# Patient Record
Sex: Female | Born: 1975 | Race: White | Hispanic: No | Marital: Married | Smoking: Former smoker
Health system: Southern US, Community
[De-identification: ages and names within clinical notes are randomized; demographics above are authoritative.]

## PROBLEM LIST (undated history)

## (undated) DIAGNOSIS — K219 Gastro-esophageal reflux disease without esophagitis: Secondary | ICD-10-CM

## (undated) DIAGNOSIS — G47419 Narcolepsy without cataplexy: Secondary | ICD-10-CM

## (undated) DIAGNOSIS — E039 Hypothyroidism, unspecified: Secondary | ICD-10-CM

## (undated) DIAGNOSIS — F329 Major depressive disorder, single episode, unspecified: Secondary | ICD-10-CM

## (undated) DIAGNOSIS — F419 Anxiety disorder, unspecified: Secondary | ICD-10-CM

## (undated) DIAGNOSIS — T4145XA Adverse effect of unspecified anesthetic, initial encounter: Secondary | ICD-10-CM

## (undated) DIAGNOSIS — M5126 Other intervertebral disc displacement, lumbar region: Secondary | ICD-10-CM

## (undated) DIAGNOSIS — F32A Depression, unspecified: Secondary | ICD-10-CM

## (undated) DIAGNOSIS — T8859XA Other complications of anesthesia, initial encounter: Secondary | ICD-10-CM

## (undated) DIAGNOSIS — G4730 Sleep apnea, unspecified: Secondary | ICD-10-CM

## (undated) DIAGNOSIS — G478 Other sleep disorders: Secondary | ICD-10-CM

## (undated) DIAGNOSIS — T7840XA Allergy, unspecified, initial encounter: Secondary | ICD-10-CM

## (undated) DIAGNOSIS — K5909 Other constipation: Secondary | ICD-10-CM

## (undated) DIAGNOSIS — G8929 Other chronic pain: Secondary | ICD-10-CM

## (undated) DIAGNOSIS — R6889 Other general symptoms and signs: Secondary | ICD-10-CM

## (undated) DIAGNOSIS — Z8489 Family history of other specified conditions: Secondary | ICD-10-CM

## (undated) HISTORY — DX: Other sleep disorders: G47.8

## (undated) HISTORY — DX: Anxiety disorder, unspecified: F41.9

## (undated) HISTORY — DX: Other chronic pain: G89.29

## (undated) HISTORY — DX: Depression, unspecified: F32.A

## (undated) HISTORY — DX: Hypothyroidism, unspecified: E03.9

## (undated) HISTORY — DX: Narcolepsy without cataplexy: G47.419

## (undated) HISTORY — DX: Other general symptoms and signs: R68.89

## (undated) HISTORY — DX: Gastro-esophageal reflux disease without esophagitis: K21.9

## (undated) HISTORY — DX: Major depressive disorder, single episode, unspecified: F32.9

## (undated) HISTORY — DX: Allergy, unspecified, initial encounter: T78.40XA

## (undated) HISTORY — DX: Other intervertebral disc displacement, lumbar region: M51.26

---
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
## 1998-07-03 ENCOUNTER — Other Ambulatory Visit: Admission: RE | Admit: 1998-07-03 | Discharge: 1998-07-03 | Payer: Self-pay | Admitting: Obstetrics & Gynecology

## 1999-06-01 ENCOUNTER — Other Ambulatory Visit: Admission: RE | Admit: 1999-06-01 | Discharge: 1999-06-01 | Payer: Self-pay | Admitting: Obstetrics & Gynecology

## 2001-01-23 ENCOUNTER — Other Ambulatory Visit: Admission: RE | Admit: 2001-01-23 | Discharge: 2001-01-23 | Payer: Self-pay | Admitting: Obstetrics and Gynecology

## 2002-01-23 ENCOUNTER — Other Ambulatory Visit: Admission: RE | Admit: 2002-01-23 | Discharge: 2002-01-23 | Payer: Self-pay | Admitting: Obstetrics and Gynecology

## 2002-11-03 ENCOUNTER — Ambulatory Visit (HOSPITAL_BASED_OUTPATIENT_CLINIC_OR_DEPARTMENT_OTHER): Admission: RE | Admit: 2002-11-03 | Discharge: 2002-11-03 | Payer: Self-pay | Admitting: Internal Medicine

## 2003-01-21 ENCOUNTER — Inpatient Hospital Stay (HOSPITAL_COMMUNITY): Admission: EM | Admit: 2003-01-21 | Discharge: 2003-01-24 | Payer: Self-pay | Admitting: Psychiatry

## 2003-02-11 ENCOUNTER — Other Ambulatory Visit: Admission: RE | Admit: 2003-02-11 | Discharge: 2003-02-11 | Payer: Self-pay | Admitting: Obstetrics and Gynecology

## 2003-10-07 ENCOUNTER — Other Ambulatory Visit: Admission: RE | Admit: 2003-10-07 | Discharge: 2003-10-07 | Payer: Self-pay | Admitting: Obstetrics and Gynecology

## 2004-04-27 ENCOUNTER — Other Ambulatory Visit: Admission: RE | Admit: 2004-04-27 | Discharge: 2004-04-27 | Payer: Self-pay | Admitting: Obstetrics and Gynecology

## 2005-04-29 ENCOUNTER — Other Ambulatory Visit: Admission: RE | Admit: 2005-04-29 | Discharge: 2005-04-29 | Payer: Self-pay | Admitting: Obstetrics and Gynecology

## 2006-04-10 ENCOUNTER — Emergency Department (HOSPITAL_COMMUNITY): Admission: EM | Admit: 2006-04-10 | Discharge: 2006-04-10 | Payer: Self-pay | Admitting: Emergency Medicine

## 2006-05-26 ENCOUNTER — Other Ambulatory Visit: Admission: RE | Admit: 2006-05-26 | Discharge: 2006-05-26 | Payer: Self-pay | Admitting: Obstetrics and Gynecology

## 2009-05-01 ENCOUNTER — Encounter: Admission: RE | Admit: 2009-05-01 | Discharge: 2009-05-01 | Payer: Self-pay | Admitting: Obstetrics and Gynecology

## 2011-01-21 NOTE — Discharge Summary (Signed)
NAME:  Meagan [name], Meagan [name]                             ACCOUNT NO.:  [id number]   MEDICAL RECORD NO.:  [id number]                   PATIENT TYPE:  IPS   LOCATION:  0306                                 FACILITY:  [facility]   PHYSICIAN:  Jeanice Lim, M.D.              DATE OF BIRTH:  06-05-1976   DATE OF ADMISSION:  01/21/2003  DATE OF DISCHARGE:  01/24/2003                                 DISCHARGE SUMMARY   IDENTIFYING DATA:  This is a 35 year old single Caucasian female voluntarily  admitted.  She presented with a history of depression, reporting suicidal  ideation.  She saw her boyfriend with ex-girlfriend.   MEDICATIONS:  1. Wellbutrin XL 300 mg q.a.m.  2. Birth control pill.   DRUG ALLERGIES:  No known drug allergies.   PHYSICAL EXAMINATION:  GENERAL:  Essentially within normal limits.  NEUROLOGIC:  Nonfocal.   LABORATORY DATA:  Routine admission labs were essentially within normal  limits including a CBC, CMET, liver function tests, and thyroid panel.  TSH  was 3.78.   MENTAL STATUS EXAM:  Alert, overweight, young female, cooperative with good  eye contact.  Speech: Clear.  Mood: Stable, mildly irritable and depressed.  Affect: Restricted.  Thought process: Goal directed.  Thought content:  Negative for dangerous ideation or psychotic symptoms.  Cognitive: Intact.  Judgment and insight: Fair.   ADMISSION DIAGNOSES:   AXIS I:  Major depression, moderate, single episode.   AXIS II:  Deferred.   AXIS III:  Narcolepsy.   AXIS IV:  Moderate problems with primary support group and other  psychosocial issues.   AXIS V:  30/60   HOSPITAL COURSE:  The patient was admitted, ordered routine p.r.n.  medications, underwent further monitoring, and was encouraged to participate  in individual, group, and milieu therapy.  Wellbutrin was optimized and  Provigil discontinued due to causing sickness.  Ritalin apparently had  caused the patient to feel suicidal in the past.  The patient  was optimized  on medications targeting depression, participated fully in treatment and  aftercare planning, was appropriate on the unit and reported resolution of  suicidal thoughts.   CONDITION ON DISCHARGE:  The patient's condition on discharge was markedly  improved.  Mood was more euthymic.  Affect: Brighter.  Thought processes:  Goal directed.  Thought content: Negative for dangerous ideation or  psychotic symptoms.  The patient reported motivation to be compliant with  the aftercare plan.   DISCHARGE MEDICATIONS:  Wellbutrin XL 300 mg q.a.m.   FOLLOW UP:  She was to follow up with Dr. Gwyneth Revels at 6 p.m. on May 24 and  Milagros Evener, M.D., on Friday, May 28 at 2:45.   DISCHARGE DIAGNOSES:   AXIS I:  Major depression, moderate, single episode.   AXIS II:  Deferred.   AXIS III:  Narcolepsy.   AXIS IV:  Moderate problems with primary support group and  other  psychosocial issues.   AXIS V:  Global assessment of functioning on discharge was 55.                                               Jeanice Lim, M.D.    JEM/MEDQ  D:  02/20/2003  T:  02/20/2003  Job:  [id number]

## 2011-01-21 NOTE — H&P (Signed)
NAME:  Meagan [name], Meagan [name]                             ACCOUNT NO.:  [id number]   MEDICAL RECORD NO.:  [id number]                   PATIENT TYPE:  IPS   LOCATION:  0306                                 [facility]ILITY[facility]  [facility]   PHYSICIAN:  Jeanice Lim, M.D.              DATE OF BIRTH:  1975/11/24   DATE OF ADMISSION:  01/21/2003  DATE OF DISCHARGE:  01/24/2003                         PSYCHIATRIC ADMISSION ASSESSMENT   IDENTIFYING INFORMATION:  This is a 35 year old single white female  voluntarily admitted on Jan 21, 2003.   HISTORY OF PRESENT ILLNESS:  The patient presents with a history of  depression, suicidal thoughts.  The patient states she saw her boyfriend  with his ex-girlfriend and became very upset and was crying, having suicidal  thoughts with no specific plan.  She called her mother who had asked to take  her to the emergency department for help.  The patient reported she is  having major problems, feels like she has no motivation.  She has a history  of narcolepsy.  She reports that her sleep has been satisfactory.  Appetite  has been fair and denies any psychotic symptoms.   PAST PSYCHIATRIC HISTORY:  First hospitalization to [facility].  No other psychiatric admissions.  She sees Dr. Evelene [name] as an  outpatient.  No history of a suicide attempt.   SOCIAL HISTORY:  This is a 35 year old single white female who has a 6-year-  old child.  She lives with her daughter.  Her child is currently with the  patient's mother.  The patient is employed at a daycare.  No legal problems.   FAMILY HISTORY:  None.   ALCOHOL/DRUG HISTORY:  The patient states that she smokes only here and  denies any alcohol or drug use.   PRIMARY CARE PHYSICIAN:  None known.   MEDICAL PROBLEMS:  Narcolepsy that was diagnosed in February of 2004 and has  an appointment with neurology clinic on February 17, 2003.   MEDICATIONS:  Has been on Wellbutrin XL 300 mg since February of 2004 and  birth  control pills.   ALLERGIES:  No known allergies.   REVIEW OF SYSTEMS:  No cardiac or pulmonary problems.  History of  narcolepsy.  Occasional problems with constipation.  No genitourinary  problems.  Is sexually active.  Has some eczema on her abdomen.  Using a  steroid cream.   PHYSICAL EXAMINATION:  VITAL SIGNS:  Temperature 98.1, pulse 100,  respiratory rate 18, blood pressure 135/82.  The patient is 5 feet tall.  She weighs 206 pounds.  GENERAL APPEARANCE:  This is a 35 year old white female, overweight, in no  acute distress.  SKIN:  Color is good.  LUNGS:  No respiratory distress.  Lungs are clear to auscultation.  HEART:  Regular rate and rhythm.  ABDOMEN:  Soft, nontender.  MUSCULOSKELETAL:  Symmetrical motor movements.  NEURO:  Nonfocal findings.  LABORATORY DATA:  CBC within normal limits.  CMET within normal limits.  TSH  3.780.  Her urine drug screen is negative.  Urinalysis showed small  hemoglobin, small leukocyte esterase.   MENTAL STATUS EXAM:  She is an alert, overweight, young female.  Cooperative  with good eye contact.  Speech is clear.  The patient appears pleasant and  euthymic.  Thought processes are coherent.  No evidence of psychosis.  No  auditory or visual hallucinations.  Cognitive function intact.  Memory is  good.  Judgment is fair.  Insight is good.   DIAGNOSES:   AXIS I:  Major depressive disorder.   AXIS II:  Deferred.   AXIS III:  Narcolepsy.   AXIS IV:  Problems with primary support group related to her ex-boyfriend  and other psychosocial problems.   AXIS V:  Current 30; estimated this past year 60.   PLAN:  Voluntary admission for depression and suicidal ideation.  Contract  for safety.  Check every 15 minutes.  Will stabilize mood and thinking so  patient can be safe.  Will continue with her Wellbutrin and follow up with  Dr. Evelene [name].     Landry Corporal, N.P.                       Jeanice Lim, M.D.    JO/MEDQ  D:   03/05/2003  T:  03/05/2003  Job:  [id number]

## 2012-01-02 ENCOUNTER — Ambulatory Visit (INDEPENDENT_AMBULATORY_CARE_PROVIDER_SITE_OTHER): Payer: BC Managed Care – PPO | Admitting: Obstetrics and Gynecology

## 2012-01-02 ENCOUNTER — Encounter: Payer: Self-pay | Admitting: Obstetrics and Gynecology

## 2012-01-02 VITALS — BP 112/72 | HR 100 | Temp 99.0°F | Ht 61.0 in | Wt 239.0 lb

## 2012-01-02 DIAGNOSIS — Z124 Encounter for screening for malignant neoplasm of cervix: Secondary | ICD-10-CM

## 2012-01-02 DIAGNOSIS — Z01419 Encounter for gynecological examination (general) (routine) without abnormal findings: Secondary | ICD-10-CM

## 2012-01-02 MED ORDER — NORETHIN-ETH ESTRAD BIPHASIC 0.5-35/1-35 MG-MCG PO TABS: 1.0000 | ORAL_TABLET | Freq: Every day | ORAL | Status: DC

## 2012-01-02 NOTE — Progress Notes (Signed)
Subjective:    Meagan [name] is a 36 y.o. female, G1P1001, who presents for an annual exam. The patient reports no complaints.  Last mammogram: was normal Last pap: was normal  2012  Menstrual cycle:   LMP: Patient's last menstrual period was 12/27/2011.           flow is light  Review of Systems Pertinent items are noted in HPI. Denies pelvic pain, uti symptoms, vaginitis symptoms, irregular bleeding, menopausal symptoms or rectal bleeding   Objective:    BP 112/72  Pulse 100  Temp(Src) 99 F (37.2 C) (Oral)  Ht 5\' 1"  (1.549 m)  Wt 239 lb (108.41 kg)  BMI 45.16 kg/m2  LMP 12/27/2011 Weight:  Wt Readings from Last 1 Encounters:  01/02/12 239 lb (108.41 kg)   BMI:  General Appearance: Alert, appropriate appearance for age. No acute distress HEENT: Grossly normal Neck / Thyroid: Supple, no masses, nodes or enlargement Lungs: clear to auscultation bilaterally Back: No CVA tenderness Breast Exam: No masses or nodes.No dimpling, nipple retraction or discharge. Cardiovascular: Regular rate and rhythm. S1, S2, no murmur Gastrointestinal: Soft, non-tender, no masses or organomegaly Pelvic Exam: External genitalia: normal general appearance Vaginal: normal rugae Cervix: normal appearance Adnexa: normal bimanual exam Uterus: normal single, nontender Rectovaginal: not indicated Lymphatic Exam: Non-palpable nodes in neck, clavicular, axillary, or inguinal regions Skin: no rash or abnormalities Extremities: no redness or tenderness in the calves or thighs, no edema negative Homan's  Neurologic: grossly normal Psychiatric: Alert and oriented, appropriate affect.   Wet Prep:not applicable Urinalysis:not applicable UPT: Not done   Assessment:    Normal gyn exam    Plan:    pap smear return annually or prn  Refill on Kariva #1 1 po qd refills x 1 year  Caria Transue,ELMIRAPA-C

## 2012-01-02 NOTE — Progress Notes (Signed)
Last Pap:04/16/12WNL: Yes Regular Periods:yes  Monthly Breast exam:yes Tetanus<80yrs:no Nl.Bladder Function:yes Daily BMs:no Healthy Diet:yes Calcium:no Mammogram:yes 2 years ago nl Exercise:no Seatbelt: yes Abuse at home: no Stressful work:yes Sigmoid-colonoscopy: no

## 2012-01-16 ENCOUNTER — Other Ambulatory Visit: Payer: Self-pay | Admitting: Obstetrics and Gynecology

## 2012-01-16 NOTE — Telephone Encounter (Signed)
Triage/epic/last seen by EP

## 2012-01-18 ENCOUNTER — Telehealth: Payer: Self-pay

## 2012-01-18 NOTE — Telephone Encounter (Signed)
This ep pt 

## 2012-01-18 NOTE — Telephone Encounter (Signed)
Patient may discontinue her current oral contraceptives and resume [place].  She may have enough refills to last until her annual exam.  She should begin [place] with her next cycle.

## 2012-01-18 NOTE — Telephone Encounter (Signed)
Pt want to switch back to kariva . Is this okay?

## 2012-01-19 NOTE — Telephone Encounter (Signed)
Tc to pt regarding request for Kariva . Informed pt that per EP, she can change back to [place] and I will call pt pharmacy with that order. Pt understands.  Called pt pharmacy [facility] on [address] for Kariva 1 po qd  # cycle  with 11 refills per EP;spoke with Clydie Braun.  lm

## 2012-12-31 ENCOUNTER — Telehealth: Payer: Self-pay | Admitting: *Deleted

## 2012-12-31 NOTE — Telephone Encounter (Signed)
Message left that we have referral from Dr. Hyacinth Meeker requesting a sleep consult.  Please call to schedule.

## 2013-01-11 ENCOUNTER — Ambulatory Visit (INDEPENDENT_AMBULATORY_CARE_PROVIDER_SITE_OTHER): Payer: Self-pay | Admitting: Neurology

## 2013-01-11 ENCOUNTER — Encounter: Payer: Self-pay | Admitting: Neurology

## 2013-01-11 VITALS — BP 141/92 | HR 98 | Temp 98.9°F | Ht 62.0 in | Wt 246.0 lb

## 2013-01-11 DIAGNOSIS — G4710 Hypersomnia, unspecified: Secondary | ICD-10-CM

## 2013-01-11 DIAGNOSIS — Z6841 Body Mass Index (BMI) 40.0 and over, adult: Secondary | ICD-10-CM

## 2013-01-11 NOTE — Progress Notes (Signed)
[facility]  Provider:  Dr Shray Hunley Referring Provider: Sigmund Hazel, MD Primary Care Physician:  Neldon Labella, MD  Chief Complaint  Patient presents with  . New Evaluation    narcolepsy, Hyacinth Meeker, rm 10    HPI:  Meagan [name] is a 37 y.o. female here as a referral from Dr. Hyacinth Meeker for evaluation of narcolepsy , diagnosed in 2002 (?) by Dr. Maple Hudson.  The patient reports that she that he remembers her sleep study, but cannot recall the date . She took initially Provigil but had no response. Tried Ritalin and also was no positive response. She started on Adderall, which helped significantly as her daytime sleepiness problem but over the years had needed an increase in doors. She is now taking 40 mg of Adderall XR in form of 20 mg capsules daily. She has run into problems with her insurance not wanting to cover the stools, considered exceedingly high for daytime use. The patient also is on sertraline the generic form of Zoloft 25 mg 2 a day and birth control pill.  The patient's paperwork apparently shows that she has been prescribed Adderall for the treatment of attention deficit disorder, but she get he tells me that she never carried that diagnosis. He endorses today the were sleepiness score at 17 points. It is on 20 mg Adderall daily, since this is the only medication form but is currently covered by her insurance.  Able discussed today of the need to perform annual sleep test followed by an and as LT to confirm the diagnosis and ulcers in the medications that have emerged over the last decade and the treatment of narcolepsy. The patient may have an additional sleep disorder to her not morbid obesity. She gained over 100 pounds she believes since been tested 12 years ago.  I informed her that she will need to wean off her Zoloft medication before and MS LT test can be valid she is willing to do without and feels that this is a stable time in her life. Review of Systems: Out of  a complete 14 system review, the patient complains of only the following symptoms, and all other reviewed systems are negative. Epworth 17 , FSS at 50 points, weight gain, snoring,  The patient has a history of sleep paralysis, vivid  Dream,  intrusion of dreams, sleep hallucinations. Narcolepsy has to be reevaluated in this patient.  Patient goes to bed between 10 and 11 PM and rises in the morning around 7 AM- no nocturia reported, the patient has a walker herself with her snoring. There is nobody to witness possible apnea.  History   Social History  . Marital Status: Single    Spouse Name: N/A    Number of Children: 1  . Years of Education: some col.   Occupational History  .      [employer]   Social History Main Topics  . Smoking status: Former Smoker    Start date: 09/05/2001  . Smokeless tobacco: Not on file     Comment: 10 years ago  . Alcohol Use: Yes     Comment: occasionally,one 3-7 oz of wine per week  . Drug Use: No  . Sexually Active: Not Currently    Birth Control/ Protection: Pill     Comment: kariva   Other Topics Concern  . Not on file   Social History Narrative  . No narrative on file    Family History  Problem Relation Age of Onset  .  Alcohol abuse Mother   . Depression Mother   . Anxiety disorder Mother   . Sleep apnea Mother   . Alcoholism Mother   . Alcohol abuse Father   . Alcoholism Father   . Alcohol abuse Brother   . Alcoholism Brother   . Multiple sclerosis Paternal Uncle     Past Medical History  Diagnosis Date  . Depression   . Anxiety   . Narcolepsy   . Sleep paralysis     at times  . Vivid dream     Past Surgical History  Procedure Laterality Date  . Tonsillectomy and adenoidectomy  63    age 32    Current Outpatient Prescriptions  Medication Sig Dispense Refill  . desogestrel-ethinyl estradiol (KARIVA,AZURETTE,MIRCETTE) 0.15-0.02/0.01 MG (21/5) tablet Take 1 tablet by mouth daily.      . sertraline  (ZOLOFT) 25 MG tablet Take 25 mg by mouth daily.       No current facility-administered medications for this visit.    Allergies as of 01/11/2013 - Review Complete 01/11/2013  Allergen Reaction Noted  . Amoxicillin Itching 01/02/2012    Vitals: BP 141/92  Pulse 98  Temp(Src) 98.9 F (37.2 C) (Oral)  Ht 5\' 2"  (1.575 m)  Wt 246 lb (111.585 kg)  BMI 44.98 kg/m2 Last Weight:  Wt Readings from Last 1 Encounters:  01/11/13 246 lb (111.585 kg)   Last Height:   Ht Readings from Last 1 Encounters:  01/11/13 5\' 2"  (1.575 m)   Vision Screening:  See vitals  Physical exam:  General: The patient is awake, alert and appears not in acute distress. The patient is well groomed. Head: Normocephalic, atraumatic. Neck is supple. Mallampati 3, neck circumference:17. Cardiovascular:  Regular rate and rhythm, without  murmurs or carotid bruit, and without distended neck veins. Respiratory: Lungs are clear to auscultation. Skin:  Without evidence of edema, or rash Trunk: BMI is highly  elevated and patient  has normal posture.  Neurologic exam : The patient is awake and alert, oriented to place and time.  Memory subjective described as intact. There is a normal attention span & concentration ability. Speech is fluent without dysarthria, dysphonia or aphasia. Mood and affect are appropriate.  Cranial nerves: Pupils are equal and briskly reactive to light. Funduscopic exam without  evidence of pallor or edema. Extraocular movements  in vertical and horizontal planes intact and without nystagmus. Visual fields by finger perimetry are intact. Hearing to finger rub intact.  Facial sensation intact to fine touch. Facial motor strength is symmetric and tongue and uvula move midline.  Motor exam:  Normal tone and normal muscle bulk and symmetric normal strength in all extremities.  Sensory:  Fine touch, pinprick and vibration were tested in all extremities. Proprioception is tested in the upper  extremities only. This was   normal.  Coordination: Rapid alternating movements in the fingers/hands is tested and normal.   Gait and station: Patient walks without assistive device .  The patient will receive a weaning check your for the Zoloft medication in preparation for  MSLT . The patient will be scheduled for a split study since a body mass index puts her at a high risk for apnea.  This will be at 3% score an AHI of 10 unless otherwise defined by her insurance. Diet advise for weight loss printed.

## 2013-01-11 NOTE — Patient Instructions (Addendum)
The patient is asked to reduce her Zoloft from currently 25 mg daily, to 25 mg every other day for 10 days then a half tablet every other day for 10 days. She should then be able to have an MSLT.

## 2013-02-12 ENCOUNTER — Ambulatory Visit (INDEPENDENT_AMBULATORY_CARE_PROVIDER_SITE_OTHER): Payer: BC Managed Care – PPO | Admitting: Neurology

## 2013-02-12 ENCOUNTER — Telehealth: Payer: Self-pay

## 2013-02-12 VITALS — BP 124/84 | HR 87 | Ht 60.0 in | Wt 240.0 lb

## 2013-02-12 DIAGNOSIS — G4733 Obstructive sleep apnea (adult) (pediatric): Secondary | ICD-10-CM

## 2013-02-12 DIAGNOSIS — G4710 Hypersomnia, unspecified: Secondary | ICD-10-CM

## 2013-02-12 NOTE — Telephone Encounter (Signed)
Patient requested her Sleep Study from 2004 to be sent to Dr. Vickey Huger and Dr.  Sigmund Hazel on 01/14/2013  asw  Faxed 12 pages to both Doctors on 01/14/2013 asw

## 2013-02-13 NOTE — Progress Notes (Signed)
See media tab for full report  

## 2013-02-20 ENCOUNTER — Telehealth: Payer: Self-pay | Admitting: *Deleted

## 2013-02-20 DIAGNOSIS — G4733 Obstructive sleep apnea (adult) (pediatric): Secondary | ICD-10-CM

## 2013-02-20 NOTE — Telephone Encounter (Signed)
Left message for patient regarding sleep study results, asked patient to call me back to discuss results and have questions answered.  Explained that a copy of the sleep study was sent to referring physician and copy of study is coming to them in the mail.  Asked pt to call us back to schedule CPAP Titration appt. -sh

## 2013-02-21 ENCOUNTER — Encounter: Payer: Self-pay | Admitting: *Deleted

## 2013-03-06 ENCOUNTER — Ambulatory Visit (INDEPENDENT_AMBULATORY_CARE_PROVIDER_SITE_OTHER): Payer: BC Managed Care – PPO | Admitting: Neurology

## 2013-03-06 VITALS — BP 127/89 | HR 82 | Ht 62.0 in | Wt 246.0 lb

## 2013-03-06 DIAGNOSIS — G4733 Obstructive sleep apnea (adult) (pediatric): Secondary | ICD-10-CM

## 2013-03-13 ENCOUNTER — Encounter: Payer: Self-pay | Admitting: *Deleted

## 2013-03-13 ENCOUNTER — Other Ambulatory Visit: Payer: Self-pay | Admitting: Neurology

## 2013-03-13 ENCOUNTER — Telehealth: Payer: Self-pay | Admitting: *Deleted

## 2013-03-13 DIAGNOSIS — G4733 Obstructive sleep apnea (adult) (pediatric): Secondary | ICD-10-CM

## 2013-03-13 NOTE — Telephone Encounter (Signed)
Called patient to discuss sleep study results from her recent CPAP Titration .  Discussed findings, recommendations and follow up care.  Patient understood well and all questions were answered. Follow up visit with Dr. Vickey Huger was rescheduled to 04/03/2013 to allow Dr. Vickey Huger to review this patient's CPAP download at the time of visit.  She will still need a compliance visit later on.  Orders were sent to [facility].  Copy of study sent to pt with follow up letter.  Results forwarded to Sigmund Hazel, MD

## 2013-03-20 ENCOUNTER — Ambulatory Visit: Payer: Self-pay | Admitting: Neurology

## 2013-04-02 ENCOUNTER — Encounter: Payer: Self-pay | Admitting: *Deleted

## 2013-04-03 ENCOUNTER — Ambulatory Visit (INDEPENDENT_AMBULATORY_CARE_PROVIDER_SITE_OTHER): Payer: BC Managed Care – PPO | Admitting: Neurology

## 2013-04-03 ENCOUNTER — Encounter: Payer: Self-pay | Admitting: Neurology

## 2013-04-03 VITALS — BP 136/93 | HR 87 | Resp 16 | Ht 62.0 in | Wt 234.0 lb

## 2013-04-03 DIAGNOSIS — G4730 Sleep apnea, unspecified: Secondary | ICD-10-CM

## 2013-04-03 DIAGNOSIS — R6889 Other general symptoms and signs: Secondary | ICD-10-CM

## 2013-04-03 DIAGNOSIS — G4733 Obstructive sleep apnea (adult) (pediatric): Secondary | ICD-10-CM

## 2013-04-03 DIAGNOSIS — G47419 Narcolepsy without cataplexy: Secondary | ICD-10-CM

## 2013-04-03 DIAGNOSIS — G4710 Hypersomnia, unspecified: Secondary | ICD-10-CM

## 2013-04-03 HISTORY — DX: Narcolepsy without cataplexy: G47.419

## 2013-04-03 NOTE — Patient Instructions (Signed)
Exercise to Lose Weight Exercise and a healthy diet may help you lose weight. Your doctor may suggest specific exercises. EXERCISE IDEAS AND TIPS  Choose low-cost things you enjoy doing, such as walking, bicycling, or exercising to workout videos.  Take stairs instead of the elevator.  Walk during your lunch break.  Park your car further away from work or school.  Go to a gym or an exercise class.  Start with 5 to 10 minutes of exercise each day. Build up to 30 minutes of exercise 4 to 6 days a week.  Wear shoes with good support and comfortable clothes.  Stretch before and after working out.  Work out until you breathe harder and your heart beats faster.  Drink extra water when you exercise.  Do not do so much that you hurt yourself, feel dizzy, or get very short of breath. Exercises that burn about 150 calories:  Running 1  miles in 15 minutes.  Playing volleyball for 45 to 60 minutes.  Washing and waxing a car for 45 to 60 minutes.  Playing touch football for 45 minutes.  Walking 1  miles in 35 minutes.  Pushing a stroller 1  miles in 30 minutes.  Playing basketball for 30 minutes.  Raking leaves for 30 minutes.  Bicycling 5 miles in 30 minutes.  Walking 2 miles in 30 minutes.  Dancing for 30 minutes.  Shoveling snow for 15 minutes.  Swimming laps for 20 minutes.  Walking up stairs for 15 minutes.  Bicycling 4 miles in 15 minutes.  Gardening for 30 to 45 minutes.  Jumping rope for 15 minutes.  Washing windows or floors for 45 to 60 minutes. Document Released: 09/24/2010 Document Revised: 11/14/2011 Document Reviewed: 09/24/2010 [facility] Patient Information 2014 [place], [place]. Sleep Apnea Sleep apnea is disorder that affects a person's sleep. A person with sleep apnea has abnormal pauses in their breathing when they sleep. It is hard for them to get a good sleep. This makes a person tired during the day. It also can lead to other physical  problems. There are three types of sleep apnea. One type is when breathing stops for a short time because your airway is blocked (obstructive sleep apnea). Another type is when the brain sometimes fails to give the normal signal to breathe to the muscles that control your breathing (central sleep apnea). The third type is a combination of the other two types. HOME CARE  Do not sleep on your back. Try to sleep on your side.  Take all medicine as told by your doctor.  Avoid alcohol, calming medicines (sedatives), and depressant drugs.  Try to lose weight if you are overweight. Talk to your doctor about a healthy weight goal. Your doctor may have you use a device that helps to open your airway. It can help you get the air that you need. It is called a positive airway pressure (PAP) device. There are three types of PAP devices:  Continuous positive airway pressure (CPAP) device.  Nasal expiratory positive airway pressure (EPAP) device.  Bilevel positive airway pressure (BPAP) device. MAKE SURE YOU:  Understand these instructions.  Will watch your condition.  Will get help right away if you are not doing well or get worse. Document Released: 05/31/2008 Document Revised: 08/08/2012 Document Reviewed: 12/24/2011 [facility] Patient Information 2014 [place], [place]. Polysomnography (Sleep Studies) Polysomnography (PSG) is a series of tests used for detecting (diagnosing) obstructive sleep apnea and other sleep disorders. The tests measure how some parts of your body  are working while you are sleeping. The tests are extensive and expensive. They are done in a sleep lab or hospital, and vary from center to center. Your caregiver may perform other more simple sleep studies and questionnaires before doing more complete and involved testing. Testing may not be covered by insurance. Some of these tests are:  An EEG (Electroencephalogram). This tests your brain waves and stages of sleep.  An EOG  (Electrooculogram). This measures the movements of your eyes. It detects periods of REM (rapid eye movement) sleep, which is your dream sleep.  An EKG (Electrocardiogram). This measures your heart rhythm.  EMG (Electromyography). This is a measurement of how the muscles are working in your upper airway and your legs while sleeping.  An oximetry measurement. It measures how much oxygen (air) you are getting while sleeping.  Breathing efforts may be measured. The same test can be interpreted (understood) differently by different caregivers and centers that study sleep.  Studies may be given an apnea/hypopnea index (AHI). This is a number which is found by counting the times of no breathing or under breathing during the night, and relating those numbers to the amount of time spent in bed. When the AHI is greater than 15, the patient is likely to complain of daytime sleepiness. When the AHI is greater than 30, the patient is at increased risk for heart problems and must be followed more closely. Following the AHI also allows you to know how treatment is working. Simple oximetry (tracking the amount of oxygen that is taken in) can be used for screening patients who:  Do not have symptoms (problems) of OSA.  Have a normal Epworth Sleepiness Scale Score.  Have a low pre-test probability of having OSA.  Have none of the upper airway problems likely to cause apnea.  Oximetry is also used to determine if treatment is effective in patients who showed significant desaturations (not getting enough oxygen) on their home sleep study. One extra measure of safety is to perform additional studies for the person who only snores. This is because no one can predict with absolute certainty who will have OSA. Those who show significant desaturations (not getting enough oxygen) are recommended to have a more detailed sleep study. Document Released: 02/26/2003 Document Revised: 11/14/2011 Document Reviewed:  08/22/2005 [facility] Patient Information 2014 [place], [place].

## 2013-04-03 NOTE — Progress Notes (Signed)
Chief Complaint  Patient presents with  . 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   The patient had been diagnosed in 2002 by Dr. Jetty Duhamel. She reported taking Provigil but had no response the original diagnosis seems to have been made in 2002. She tried Ritalin after that again without a positive response started on Adderall which helped significantly but she hasn't needed increasing doses of the medication. At the time being that she was taking 40 mrem of Adderall XR divided into doses. Problem with  her insurance coverage became evident as they did not want to cover the medicines, and considered the dose of Adderall  exceedingly too high for daytime use.   The patient also weaned off Zoloft in preparation for an MS LT. As the discussed at our initial visit I was suspicious that there may be an additional sleep disorder and form of obstructive sleep apnea as the patient had gained over 100 pollens in the period between her to sleep studies now. Indeed she tested positive for obstructive sleep apnea and was placed on CPAP.  The study was read on 02/18/2013 her baseline AHI was 28.4, her RDI is 36.7 in supine her AHI reached 60 and during REM 23.5. She also had frequent limb movements some of this lead to arousal and fragmentation of sleep the index here was 3.8  PLM related arousals. Since being off the Zoloft, her sleep has changed. She noticed having more emotional responses , and she likes this.  Her sleep time and pattern is not changed, only more dreams are noted.  The patient had been told by her mother that as a child she was a restless, kicking sleeper. The knee today 14 days after CPAP initiation to look at the compliance the AHI has been significantly reduced to 1.4 apneas per hour of sleep her CPAP is set at 9 cm water pressure visit centimeter EPR but her average daily usage at this time is only 2 hours and  17 minutes. There is an up going trend noted in her user time and ankle which the patient now that she has received another month to continue using the CPAP for at least 4 hours at night.  As the her initial visit the patient endorses a high Epworth sleepiness score of that CPAP may have not had time yet to kick in. Today's Epworth score is 19 points and her fatigue severity score is 57 points the patient also has a symptom report of visit ringings sleep paralysis, intrusion of dreams, sleep hallucinations and narcolepsy Will has to be will have to be reevaluated in this patient after 30 days of CPAP he was I feel that he can make this arrangement.today In general the patient for an MS LT test. Again this will be based on the observation period of at least 30 days of compliant he was told that he can evaluate the CPAP is effective in reducing the daytime sleepiness or not. At this time there has been no positive effect yet.  The patient has joined weight loss programs, the  Last was  weight watchers, lost 10 pounds since last visit. Patient's mother and brother have sleep apnea. There is no known family member was narcolepsy. To be a great uncle by history. The patient is aware that her BMI contributes to her sleep apnea problem but the BMI is truly not a manifestation  of narcolepsy itself.  Narcoleptic patients is moderate obesity may have both problems due to a lack of Hypocretin,  also known as Orexin.  I also advised the patient to contact the narcolepsy net-work for further information.  I suggested that 2 and 5  Day intermittent fast as a diet to promote weight loss, a one day a week fast can be used in nondiabetic patients to maintain weight and stop gaining.  ROS , Epworth  19, weakness in arms and legs, tingling in hands and fingers- had enuresis twice-  Patient voiced concerns about diabetes and MS.    General: The patient is awake, alert and appears not in acute distress. The patient is well  groomed.  Head: Normocephalic, atraumatic. Neck is supple. Mallampati 3, neck circumference:17.  Cardiovascular: Regular rate and rhythm, without murmurs or carotid bruit, and without distended neck veins.  Respiratory: Lungs are clear to auscultation.  Skin: Without evidence of edema, or rash  Trunk: BMI is highly elevated and patient has normal posture.  Neurologic exam :  The patient is awake and alert, oriented to place and time. Memory subjective described as intact. There is a normal attention span & concentration ability. Speech is fluent without dysarthria, dysphonia or aphasia.  Mood and affect are appropriate.  Cranial nerves:  Pupils are equal and briskly reactive to light. Hearing to finger rub intact. Facial sensation intact to fine touch. Facial motor strength is symmetric and tongue and uvula move midline.  Motor exam: Normal tone and normal muscle bulk and symmetric normal strength in all extremities.  Sensory: Fine touch, pinprick and vibration were tested in all extremities and normal. The patient reports subjective , paroxysmal sensory changes in her hands and feet.  Coordination: Rapid alternating movements in the fingers/hands is tested and normal.  Gait and station: Patient walks without assistive device .    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   PLAN:  The patient will remain off  the Zoloft medication in preparation for MSLT.  The patient will be scheduled for a PSG on CPAP ,followed by MSLT . Since a body mass index puts her at a high risk for apnea,  diet education and weight loss information is provided.

## 2013-04-16 ENCOUNTER — Encounter (INDEPENDENT_AMBULATORY_CARE_PROVIDER_SITE_OTHER): Payer: BC Managed Care – PPO

## 2013-04-16 ENCOUNTER — Ambulatory Visit (INDEPENDENT_AMBULATORY_CARE_PROVIDER_SITE_OTHER): Payer: BC Managed Care – PPO | Admitting: Neurology

## 2013-04-16 DIAGNOSIS — M6281 Muscle weakness (generalized): Secondary | ICD-10-CM

## 2013-04-16 DIAGNOSIS — G47419 Narcolepsy without cataplexy: Secondary | ICD-10-CM

## 2013-04-16 DIAGNOSIS — G4710 Hypersomnia, unspecified: Secondary | ICD-10-CM

## 2013-04-16 DIAGNOSIS — Z0289 Encounter for other administrative examinations: Secondary | ICD-10-CM

## 2013-04-16 DIAGNOSIS — G4730 Sleep apnea, unspecified: Secondary | ICD-10-CM

## 2013-04-16 NOTE — Procedures (Signed)
  HISTORY:  Meagan [name] is a 37 year old patient with a history of episodic heaviness sensations of the arms bilaterally, right equal to left, over the last 2 or 3 months. The episodes may last all day long, but only occur once or twice a week. The patient feels normal between episodes. The patient being evaluated for a possible neuropathy or a radiculopathy. The patient has no issues with the legs.  NERVE CONDUCTION STUDIES:  Nerve conduction studies were performed on both upper extremities. The distal motor latencies and motor amplitudes for the median and ulnar nerves were within normal limits. The F wave latencies and nerve conduction velocities for these nerves were also normal. The sensory latencies for the median and ulnar nerves were normal.  Nerve conduction studies were performed on the right lower extremity. The distal motor latencies and motor amplitudes for the peroneal and posterior tibial nerves were within normal limits. The nerve conduction velocities for these nerves were also normal. The H reflex latency is normal. The sensory latency for the peroneal nerve was within normal limits.   EMG STUDIES:  EMG study was performed on the right upper extremity:  The first dorsal interosseous muscle reveals 2 to 3 K units with full recruitment. No fibrillations or positive waves were noted. The abductor pollicis brevis muscle reveals 2 to 3 K units with full recruitment. No fibrillations or positive waves were noted. The extensor indicis proprius muscle reveals 1 to 3 K units with full recruitment. No fibrillations or positive waves were noted. The pronator teres muscle reveals 1 to 3 K units with full recruitment. No fibrillations or positive waves were noted. The biceps muscle reveals 1 to 2 K units with full recruitment. No fibrillations or positive waves were noted. The triceps muscle reveals 2 to 3 K units with full recruitment. No fibrillations or positive waves were noted. The  anterior deltoid muscle reveals 1 to 3 K units with full recruitment. No fibrillations or positive waves were noted. The cervical paraspinal muscles were tested at 2 levels. No abnormalities of insertional activity were seen at either level tested. There was fair relaxation.   IMPRESSION:  Nerve conduction studies done on both upper extremities the right lower extremity were within normal limits. No evidence of a peripheral neuropathy is seen. EMG evaluation of the right upper extremity was unremarkable, without evidence of a cervical radiculopathy.  Meagan Palau MD 04/16/2013 4:40 PM  [facility] [address] [place], [place] [address]  Phone [phone] Fax [phone]

## 2013-04-17 ENCOUNTER — Ambulatory Visit: Payer: Self-pay | Admitting: Neurology

## 2013-05-08 ENCOUNTER — Encounter: Payer: Self-pay | Admitting: Neurology

## 2013-06-17 ENCOUNTER — Other Ambulatory Visit: Payer: Self-pay | Admitting: Family Medicine

## 2013-06-17 DIAGNOSIS — R109 Unspecified abdominal pain: Secondary | ICD-10-CM

## 2013-06-21 ENCOUNTER — Ambulatory Visit
Admission: RE | Admit: 2013-06-21 | Discharge: 2013-06-21 | Disposition: A | Discharge status: Home / Self Care | Payer: BC Managed Care – PPO | Source: Ambulatory Visit | Attending: Family Medicine | Admitting: Family Medicine

## 2013-06-21 DIAGNOSIS — R109 Unspecified abdominal pain: Secondary | ICD-10-CM

## 2013-06-28 ENCOUNTER — Encounter: Payer: Self-pay | Admitting: Neurology

## 2013-07-02 ENCOUNTER — Encounter: Payer: Self-pay | Admitting: Neurology

## 2013-07-25 ENCOUNTER — Telehealth: Payer: Self-pay | Admitting: Neurology

## 2013-07-25 NOTE — Telephone Encounter (Addendum)
Returned call to patient who was inquiring about trying to obtain rx for Adderall from dx of Narcolepsy several years ago.  Explained to the patient that Dr. Vickey Huger recommended as well as ordered her a retesting of her narcolepsy in the form of an MSLT during her visit on 04/03/2013.  The patient has yet to schedule this appointment.  I did reiterate to the patient that it would be difficult to get the medication approved without a recent dx.  She is unable to take any time off from work to have the test done until the beginning of the year.  She will call at that time to follow up with her appointment.

## 2013-07-30 ENCOUNTER — Other Ambulatory Visit (HOSPITAL_COMMUNITY): Payer: Self-pay | Admitting: Family Medicine

## 2013-07-30 DIAGNOSIS — R109 Unspecified abdominal pain: Secondary | ICD-10-CM

## 2013-08-16 ENCOUNTER — Encounter (HOSPITAL_COMMUNITY): Payer: BC Managed Care – PPO

## 2013-09-02 ENCOUNTER — Ambulatory Visit (INDEPENDENT_AMBULATORY_CARE_PROVIDER_SITE_OTHER): Payer: BC Managed Care – PPO

## 2013-09-02 DIAGNOSIS — G4733 Obstructive sleep apnea (adult) (pediatric): Secondary | ICD-10-CM

## 2013-09-12 ENCOUNTER — Encounter: Payer: Self-pay | Admitting: *Deleted

## 2013-09-12 ENCOUNTER — Other Ambulatory Visit: Payer: Self-pay | Admitting: *Deleted

## 2013-09-12 ENCOUNTER — Telehealth: Payer: Self-pay | Admitting: Neurology

## 2013-09-12 DIAGNOSIS — G4733 Obstructive sleep apnea (adult) (pediatric): Secondary | ICD-10-CM

## 2013-09-12 DIAGNOSIS — G4710 Hypersomnia, unspecified: Secondary | ICD-10-CM

## 2013-09-12 DIAGNOSIS — G4730 Sleep apnea, unspecified: Secondary | ICD-10-CM

## 2013-09-12 DIAGNOSIS — Z6841 Body Mass Index (BMI) 40.0 and over, adult: Secondary | ICD-10-CM

## 2013-09-12 NOTE — Telephone Encounter (Signed)
I called and left a message for the patient that Dr. Vickey Hugerohmeier has new order for CPAP setting. I will send new order to [facility]. I will mail a copy of the report along with a follow up instruction letter to the patient's home.

## 2013-11-17 ENCOUNTER — Encounter (HOSPITAL_COMMUNITY): Payer: Self-pay | Admitting: Emergency Medicine

## 2013-11-17 ENCOUNTER — Emergency Department (HOSPITAL_COMMUNITY): Payer: BC Managed Care – PPO

## 2013-11-17 ENCOUNTER — Emergency Department (HOSPITAL_COMMUNITY)
Admission: EM | Admit: 2013-11-17 | Discharge: 2013-11-17 | Disposition: A | Discharge status: Home / Self Care | Payer: BC Managed Care – PPO | Attending: Emergency Medicine | Admitting: Emergency Medicine

## 2013-11-17 DIAGNOSIS — Z79899 Other long term (current) drug therapy: Secondary | ICD-10-CM | POA: Insufficient documentation

## 2013-11-17 DIAGNOSIS — R109 Unspecified abdominal pain: Secondary | ICD-10-CM

## 2013-11-17 DIAGNOSIS — G47419 Narcolepsy without cataplexy: Secondary | ICD-10-CM | POA: Insufficient documentation

## 2013-11-17 DIAGNOSIS — R110 Nausea: Secondary | ICD-10-CM | POA: Insufficient documentation

## 2013-11-17 DIAGNOSIS — F410 Panic disorder [episodic paroxysmal anxiety] without agoraphobia: Secondary | ICD-10-CM | POA: Insufficient documentation

## 2013-11-17 DIAGNOSIS — Z87891 Personal history of nicotine dependence: Secondary | ICD-10-CM | POA: Insufficient documentation

## 2013-11-17 DIAGNOSIS — E669 Obesity, unspecified: Secondary | ICD-10-CM | POA: Insufficient documentation

## 2013-11-17 DIAGNOSIS — F3289 Other specified depressive episodes: Secondary | ICD-10-CM | POA: Insufficient documentation

## 2013-11-17 DIAGNOSIS — F329 Major depressive disorder, single episode, unspecified: Secondary | ICD-10-CM | POA: Insufficient documentation

## 2013-11-17 DIAGNOSIS — K8020 Calculus of gallbladder without cholecystitis without obstruction: Secondary | ICD-10-CM

## 2013-11-17 LAB — COMPREHENSIVE METABOLIC PANEL
ALBUMIN: 3.5 g/dL (ref 3.5–5.2)
ALK PHOS: 78 U/L (ref 39–117)
ALT: 15 U/L (ref 0–35)
AST: 29 U/L (ref 0–37)
BUN: 13 mg/dL (ref 6–23)
CALCIUM: 9.7 mg/dL (ref 8.4–10.5)
CO2: 22 mEq/L (ref 19–32)
Chloride: 101 mEq/L (ref 96–112)
Creatinine, Ser: 0.87 mg/dL (ref 0.50–1.10)
GFR calc non Af Amer: 83 mL/min — ABNORMAL LOW (ref >90)
GLUCOSE: 111 mg/dL — AB (ref 70–99)
POTASSIUM: 3.9 meq/L (ref 3.7–5.3)
SODIUM: 140 meq/L (ref 137–147)
TOTAL PROTEIN: 7.2 g/dL (ref 6.0–8.3)
Total Bilirubin: 0.3 mg/dL (ref 0.3–1.2)

## 2013-11-17 LAB — CBC
HCT: 38.2 % (ref 36.0–46.0)
Hemoglobin: 13 g/dL (ref 12.0–15.0)
MCH: 27.1 pg (ref 26.0–34.0)
MCHC: 34 g/dL (ref 30.0–36.0)
MCV: 79.6 fL (ref 78.0–100.0)
PLATELETS: 370 10*3/uL (ref 150–400)
RBC: 4.8 MIL/uL (ref 3.87–5.11)
RDW: 13.3 % (ref 11.5–15.5)
WBC: 14.8 10*3/uL — ABNORMAL HIGH (ref 4.0–10.5)

## 2013-11-17 LAB — TROPONIN I: Troponin I: <0.3 ng/mL (ref <0.30)

## 2013-11-17 LAB — LIPASE, BLOOD: Lipase: 88 U/L — ABNORMAL HIGH (ref 11–59)

## 2013-11-17 MED ORDER — ONDANSETRON 4 MG PO TBDP: 4.0000 mg | ORAL_TABLET | Freq: Three times a day (TID) | ORAL | Status: DC | PRN

## 2013-11-17 MED ORDER — OXYCODONE-ACETAMINOPHEN 5-325 MG PO TABS: 1.0000 | ORAL_TABLET | Freq: Four times a day (QID) | ORAL | Status: DC | PRN

## 2013-11-17 NOTE — ED Notes (Signed)
US at bedside

## 2013-11-17 NOTE — Discharge Instructions (Signed)
Avoid fried, spicy or greasy foods or anything else that makes you have pain. Use the pain and nausea medication if needed. [facility] to get an appointment with Dr Corliss Skainssuei, the surgeon on call today, to discuss having your gallbladder removed.  Return to the ED if you get a fever, or have uncontrolled vomiting or severe pain not controlled by the medications.

## 2013-11-17 NOTE — ED Notes (Signed)
Pt was asking this RN to remove her IV.  She was educated that we need to wait until after the Ultrasound.

## 2013-11-17 NOTE — ED Provider Notes (Signed)
Patient left at change of shift to get ultrasound of right upper quadrant report. Pleasant white female reports she is noted that she cannot tolerate greasy foods recently. She has been eliminating greasy foods from her diet however last night she ate a black bean burger. She started having a pain in her epigastric area radiating into her back with nausea and vomiting. She states the pain was very intense. Currently patient is smiling and states her pain is much improved.  Review of her laboratory results show she has some elevation of her white blood cell count however she did have vomiting. She has a mildly elevated lipase otherwise her LFTs were all normal. We discussed that she may have had a small stone that went into the pancreas causing  minor pancreatitis. Patient feels comfortable being discharged and to followup as an outpatient with surgery.  [place] Abdomen Complete  11/17/2013   CLINICAL DATA:  Epigastric pain  EXAM: ULTRASOUND ABDOMEN COMPLETE  COMPARISON:  Prior abdominal ultrasound 06/21/2013  FINDINGS: Gallbladder:  There is a single echogenic focus within the gallbladder neck which demonstrates posterior acoustic shadowing consistent with cholelithiasis. No gallbladder wall thickening, pericholecystic fluid or positive sonographic Murphy sign (per the sonographer).  Common bile duct:  Diameter: Within normal limits at 3.3 mm in diameter.  Liver:  No focal lesion identified. Within normal limits in parenchymal echogenicity.  IVC:  No abnormality visualized.  Pancreas:  Visualized portion unremarkable.  Spleen:  Size and appearance within normal limits.  Right Kidney:  Length: 10.3 cm. Echogenicity within normal limits. No mass or hydronephrosis visualized.  Left Kidney:  Length: 10.8 cm. Echogenicity within normal limits. No mass or hydronephrosis visualized.  Abdominal aorta:  No aneurysm visualized.  Other findings:  None.  IMPRESSION: 1. Cholelithiasis without sonographic evidence of acute  cholecystitis. 2. Otherwise, unremarkable right upper quadrant ultrasound.   Electronically Signed   By: Malachy MoanHeath  McCullough M.D.   On: 11/17/2013 08:46    Diagnoses that have been ruled out:  None  Diagnoses that are still under consideration:  None  Final diagnoses:  Abdominal pain  Gallstones     New Prescriptions   ONDANSETRON (ZOFRAN ODT) 4 MG DISINTEGRATING TABLET    Take 1 tablet (4 mg total) by mouth every 8 (eight) hours as needed for nausea or vomiting.   OXYCODONE-ACETAMINOPHEN (PERCOCET/ROXICET) 5-325 MG PER TABLET    Take 1 tablet by mouth every 6 (six) hours as needed for severe pain.    Plan discharge     Ward GivensIva L Montez Cuda, MD 11/17/13 [phone]

## 2013-11-17 NOTE — ED Notes (Signed)
MD at bedside. 

## 2013-11-17 NOTE — ED Notes (Signed)
Bed: [facility]PT1 Expected date: 11/17/13 Expected time: 4:19 AM Means of arrival: Ambulance Comments: Panic attack

## 2013-11-17 NOTE — ED Notes (Signed)
Per EMS , pt. From home with complaint of panic attack "felt like burning on her chest after eating a large burger  Last night, denies SOB.  Ambulatory upon arrival to ED.

## 2013-11-17 NOTE — ED Provider Notes (Signed)
CSN: [id number]     Arrival date & time 11/17/13  0433 History   First MD Initiated Contact with Patient 11/17/13 0505     Chief Complaint  Patient presents with  . Panic Attack    pt had gas pain and then had an attack  . Chest Pain     (Consider location/radiation/quality/duration/timing/severity/associated sxs/prior Treatment) Patient is a 38 y.o. female presenting with chest pain. The history is provided by the patient and the EMS personnel.  Chest Pain Associated symptoms: nausea   Associated symptoms: no abdominal pain, no cough, no fever, no headache and no shortness of breath   pt indicates this evening was having pain, midline/lower sternal area, also felt in back, dull, constant, mod-severe. States became increasingly anxious with the pain and then 'had full blown panic attack'.  Pt notes similar episode in past, states prior u/s neg for gallstones, is scheduled to have outpatient hida scan. Denies fever or chills. Nausea. No vomiting or diarrhea. No pleuritic pain, and discomfort has resolved. No exertional cp or discomfort. No associated sob, no diaphoresis. No palpitations. No unusual doe or fatigue. Pain not related to eating, not related to exertion.      Past Medical History  Diagnosis Date  . Depression   . Anxiety   . Narcolepsy   . Sleep paralysis     at times  . Vivid dream   . 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 Meanwhile  Medications have become ineffective and patient gained 100 pounds, developed OSA, now on CPAP 9 cm, MSLT to follow in 04-19-13.    Past Surgical History  Procedure Laterality Date  . Tonsillectomy and adenoidectomy  21981    age 89   Family History  Problem Relation Age of Onset  . Alcohol abuse Mother   . Depression Mother   . Anxiety disorder Mother   . Sleep apnea Mother   . Alcoholism Mother   . Alcohol abuse Father   . Alcoholism Father   . Alcohol abuse Brother   . Alcoholism Brother   .  Multiple sclerosis Paternal Uncle    History  Substance Use Topics  . Smoking status: Former Smoker    Start date: 09/05/2001  . Smokeless tobacco: Not on file     Comment: 10 years ago  . Alcohol Use: Yes     Comment: occasionally,one 3-7 oz of wine per week   OB History   Grav Para Term Preterm Abortions TAB SAB Ect Mult Living   1 1 1       1      Review of Systems  Constitutional: Negative for fever and chills.  HENT: Negative for sore throat.   Eyes: Negative for redness.  Respiratory: Negative for cough and shortness of breath.   Cardiovascular: Positive for chest pain.  Gastrointestinal: Positive for nausea. Negative for abdominal pain.  Genitourinary: Negative for flank pain.  Musculoskeletal: Negative for neck pain.  Skin: Negative for rash.  Neurological: Negative for headaches.  Hematological: Does not bruise/bleed easily.  Psychiatric/Behavioral: The patient is nervous/anxious.       Allergies  Amoxicillin  Home Medications   Current Outpatient Rx  Name  Route  Sig  Dispense  Refill  . desogestrel-ethinyl estradiol (KARIVA,AZURETTE,MIRCETTE) 0.15-0.02/0.01 MG (21/5) tablet   Oral   Take 1 tablet by mouth daily.          BP 132/108  Pulse 94  Temp(Src) 98.1 F (36.7 C) (Oral)  Resp 19  Ht 5' (1.524 m)  Wt 230 lb (104.327 kg)  BMI 44.92 kg/m2  SpO2 100%  LMP 11/16/2013 Physical Exam  Nursing note and vitals reviewed. Constitutional: She is oriented to person, place, and time. She appears well-developed and well-nourished. No distress.  HENT:  Mouth/Throat: Oropharynx is clear and moist.  Eyes: Conjunctivae are normal. No scleral icterus.  Neck: Neck supple. No tracheal deviation present.  Cardiovascular: Normal rate, regular rhythm, normal heart sounds and intact distal pulses.  Exam reveals no gallop and no friction rub.   No murmur heard. Pulmonary/Chest: Effort normal and breath sounds normal. No respiratory distress.  Abdominal: Soft.  Normal appearance and bowel sounds are normal. She exhibits no distension and no mass. There is no tenderness. There is no rebound and no guarding.  obese  Genitourinary:  No cva tenderness  Musculoskeletal: She exhibits no edema and no tenderness.  Neurological: She is alert and oriented to person, place, and time.  Skin: Skin is warm and dry. No rash noted. She is not diaphoretic.  Psychiatric: She has a normal mood and affect.    ED Course  Procedures (including critical care time)   Results for orders placed during the hospital encounter of 11/17/13  CBC      Result Value Ref Range   WBC 14.8 (*) 4.0 - 10.5 K/uL   RBC 4.80  3.87 - 5.11 MIL/uL   Hemoglobin 13.0  12.0 - 15.0 g/dL   HCT 16.1  09.6 - 04.5 %   MCV 79.6  78.0 - 100.0 fL   MCH 27.1  26.0 - 34.0 pg   MCHC 34.0  30.0 - 36.0 g/dL   RDW 40.9  81.1 - 91.4 %   Platelets 370  150 - 400 K/uL  COMPREHENSIVE METABOLIC PANEL      Result Value Ref Range   Sodium 140  137 - 147 mEq/L   Potassium 3.9  3.7 - 5.3 mEq/L   Chloride 101  96 - 112 mEq/L   CO2 22  19 - 32 mEq/L   Glucose, Bld 111 (*) 70 - 99 mg/dL   BUN 13  6 - 23 mg/dL   Creatinine, Ser 7.82  0.50 - 1.10 mg/dL   Calcium 9.7  8.4 - 95.6 mg/dL   Total Protein 7.2  6.0 - 8.3 g/dL   Albumin 3.5  3.5 - 5.2 g/dL   AST 29  0 - 37 U/L   ALT 15  0 - 35 U/L   Alkaline Phosphatase 78  39 - 117 U/L   Total Bilirubin 0.3  0.3 - 1.2 mg/dL   GFR calc non Af Amer 83 (*) >90 mL/min   GFR calc Af Amer >90  >90 mL/min  TROPONIN I      Result Value Ref Range   Troponin I <0.30  <0.30 ng/mL  LIPASE, BLOOD      Result Value Ref Range   Lipase 88 (*) 11 - 59 U/L      EKG Interpretation   Date/Time:  Sunday November 17 2013 04:54:51 EDT Ventricular Rate:  94 PR Interval:  138 QRS Duration: 89 QT Interval:  351 QTC Calculation: 439 R Axis:   48 Text Interpretation:  Sinus rhythm No previous tracing Confirmed by Denton Lank   MD, Caryn Bee ([id number]) on 11/17/2013 5:24:14 AM       MDM   Ecg. Labs.  Reviewed nursing notes and prior charts for additional history.   pts prior u/s 2014.  Given todays symptoms, epigastric/upper quadrant pain radiating to back/midscapula, c/w biliary colic, elev wbc, mild elev lipase, will get u/s.    If u/s neg, and pt improved, pt may need outpt hida scan, w gen surg referral -  It looks like prior hida scan had been ordered in 2014, however not completed.   Signed out to oncoming provider to check u/s when back, dispo appropriately.     Suzi Roots, MD 11/17/13 [phone]

## 2013-11-25 ENCOUNTER — Encounter (INDEPENDENT_AMBULATORY_CARE_PROVIDER_SITE_OTHER): Payer: Self-pay | Admitting: Surgery

## 2013-11-25 ENCOUNTER — Ambulatory Visit (INDEPENDENT_AMBULATORY_CARE_PROVIDER_SITE_OTHER): Payer: BC Managed Care – PPO | Admitting: Surgery

## 2013-11-25 VITALS — BP 140/84 | HR 78 | Temp 98.0°F | Resp 16 | Ht 60.0 in | Wt 236.0 lb

## 2013-11-25 DIAGNOSIS — K8010 Calculus of gallbladder with chronic cholecystitis without obstruction: Secondary | ICD-10-CM

## 2013-11-25 NOTE — Progress Notes (Signed)
Patient ID: Meagan [name], female   DOB: Nov 01, 1975, 38 y.o.   MRN: [id number]  Chief Complaint  Patient presents with  . eval gallbladder    new pt    HPI Meagan [name] is a 38 y.o. female.  Referred by Dr. Devoria Albe for evaluation of gallbladder disease HPI This is a 38 year old female who presents with a one-year history of intermittent epigastric abdominal pain. This seemed to get worse with feeding hamburgers so she stopped eating burgers. Recently she had a severe attack of epigastric pain that radiated through to her back. She had some nausea and has had vomiting with some of the previous attacks. She does report occasional diarrhea. She does report abdominal bloating. She had a previous ultrasound which showed no sign of gallstones but with her recent emergency department visit had a repeat ultrasound. This showed a single shadowing gallstone. Her lipase was mildly elevated but her liver function tests were normal. She continues to have mild symptoms. She presents now for surgical evaluation for cholecystectomy.  Past Medical History  Diagnosis Date  . Depression   . Anxiety   . Narcolepsy   . Sleep paralysis     at times  . Vivid dream   . 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 Meanwhile  Medications have become ineffective and patient gained 100 pounds, developed OSA, now on CPAP 9 cm, MSLT to follow in 04-19-13.     Past Surgical History  Procedure Laterality Date  . Tonsillectomy and adenoidectomy  36    age 78    Family History  Problem Relation Age of Onset  . Alcohol abuse Mother   . Depression Mother   . Anxiety disorder Mother   . Sleep apnea Mother   . Alcoholism Mother   . Alcohol abuse Father   . Alcoholism Father   . Alcohol abuse Brother   . Alcoholism Brother   . Multiple sclerosis Paternal Uncle     Social History History  Substance Use Topics  . Smoking status: Former Smoker    Start date: 09/05/2001  . Smokeless  tobacco: Not on file     Comment: 10 years ago  . Alcohol Use: Yes     Comment: occasionally,one 3-7 oz of wine per week    Allergies  Allergen Reactions  . Amoxicillin Itching and Rash    Current Outpatient Prescriptions  Medication Sig Dispense Refill  . bismuth subsalicylate (PEPTO BISMOL) 262 MG/15ML suspension Take 30 mLs by mouth every 6 (six) hours as needed for indigestion.      . calcium carbonate (TUMS - DOSED IN MG ELEMENTAL CALCIUM) 500 MG chewable tablet Chew 1 tablet by mouth 2 (two) times daily as needed for indigestion or heartburn.      . desogestrel-ethinyl estradiol (KARIVA,AZURETTE,MIRCETTE) 0.15-0.02/0.01 MG (21/5) tablet Take 1 tablet by mouth daily.      . ergocalciferol (VITAMIN D2) 50000 UNITS capsule Take 50,000 Units by mouth once a week.      . thiamine (VITAMIN B-1) 100 MG tablet Take 100 mg by mouth daily.      . ondansetron (ZOFRAN ODT) 4 MG disintegrating tablet Take 1 tablet (4 mg total) by mouth every 8 (eight) hours as needed for nausea or vomiting.  10 tablet  0  . oxyCODONE-acetaminophen (PERCOCET/ROXICET) 5-325 MG per tablet Take 1 tablet by mouth every 6 (six) hours as needed for severe pain.  15 tablet  0   No current  facility-administered medications for this visit.    Review of Systems Review of Systems  Constitutional: Negative for fever, chills and unexpected weight change.  HENT: Negative for congestion, hearing loss, sore throat, trouble swallowing and voice change.   Eyes: Negative for visual disturbance.  Respiratory: Negative for cough and wheezing.   Cardiovascular: Positive for chest pain. Negative for palpitations and leg swelling.  Gastrointestinal: Positive for nausea, vomiting, abdominal pain, diarrhea and abdominal distention. Negative for constipation, blood in stool and anal bleeding.  Genitourinary: Negative for hematuria, vaginal bleeding and difficulty urinating.  Musculoskeletal: Negative for arthralgias.  Skin: Negative  for rash and wound.  Neurological: Negative for seizures, syncope and headaches.  Hematological: Negative for adenopathy. Does not bruise/bleed easily.  Psychiatric/Behavioral: Negative for confusion.    Blood pressure 140/84, pulse 78, temperature 98 F (36.7 C), temperature source Oral, resp. rate 16, height 5' (1.524 m), weight 236 lb (107.049 kg), last menstrual period 11/16/2013.  Physical Exam Physical Exam WDWN in NAD HEENT:  EOMI, sclera anicteric Neck:  No masses, no thyromegaly Lungs:  CTA bilaterally; normal respiratory effort CV:  Regular rate and rhythm; no murmurs Abd:  +bowel sounds, obese, soft, non-tender; no palpable masses Ext:  Well-perfused; no edema Skin:  Warm, dry; no sign of jaundice  Data Reviewed [place] Abdomen Complete  11/17/2013   CLINICAL DATA:  Epigastric pain  EXAM: ULTRASOUND ABDOMEN COMPLETE  COMPARISON:  Prior abdominal ultrasound 06/21/2013  FINDINGS: Gallbladder:  There is a single echogenic focus within the gallbladder neck which demonstrates posterior acoustic shadowing consistent with cholelithiasis. No gallbladder wall thickening, pericholecystic fluid or positive sonographic Murphy sign (per the sonographer).  Common bile duct:  Diameter: Within normal limits at 3.3 mm in diameter.  Liver:  No focal lesion identified. Within normal limits in parenchymal echogenicity.  IVC:  No abnormality visualized.  Pancreas:  Visualized portion unremarkable.  Spleen:  Size and appearance within normal limits.  Right Kidney:  Length: 10.3 cm. Echogenicity within normal limits. No mass or hydronephrosis visualized.  Left Kidney:  Length: 10.8 cm. Echogenicity within normal limits. No mass or hydronephrosis visualized.  Abdominal aorta:  No aneurysm visualized.  Other findings:  None.  IMPRESSION: 1. Cholelithiasis without sonographic evidence of acute cholecystitis. 2. Otherwise, unremarkable right upper quadrant ultrasound.   Electronically Signed   By: Malachy MoanHeath  McCullough  M.D.   On: 11/17/2013 08:46    Lab Results  Component Value Date   WBC 14.8* 11/17/2013   HGB 13.0 11/17/2013   HCT 38.2 11/17/2013   MCV 79.6 11/17/2013   PLT 370 11/17/2013   Lab Results  Component Value Date   CREATININE 0.87 11/17/2013   BUN 13 11/17/2013   NA 140 11/17/2013   K 3.9 11/17/2013   CL 101 11/17/2013   CO2 22 11/17/2013   Lab Results  Component Value Date   ALT 15 11/17/2013   AST 29 11/17/2013   ALKPHOS 78 11/17/2013   BILITOT 0.3 11/17/2013   Lab Results  Component Value Date   LIPASE 88* 11/17/2013     Assessment    Chronic calculus cholecystitis     Plan    Laparoscopic cholecystectomy with intraoperative cholangiogram. The surgical procedure has been discussed with the patient.  Potential risks, benefits, alternative treatments, and expected outcomes have been explained.  All of the patient's questions at this time have been answered.  The likelihood of reaching the patient's treatment goal is good.  The patient understand the proposed surgical procedure and wishes  to proceed.         Meagan [name] K. 11/25/2013, 12:39 PM

## 2013-12-09 ENCOUNTER — Encounter (HOSPITAL_COMMUNITY): Payer: Self-pay | Admitting: Pharmacy Technician

## 2013-12-13 NOTE — Pre-Procedure Instructions (Signed)
Jonette MateJoy K Sloop  12/13/2013   Your procedure is scheduled on:  Tuesday December 24, 2013 at 10:54 AM.  Report to [facility] Short Stay Entr[facility] "A"  Admitting at 8:50 AM.  Call this number if you have problems the morning of surgery: [phone]   Remember:   Do not eat food or drink liquids after midnight.   Take these medicines the morning of surgery with A SIP OF WATER: Desogestrel-Ethinyl Estradiol, Ondansetron (Zofran), and Oxycodone if needed for pain  Discontinue aspirin and herbal medications 5 days prior to surgery     Do not wear jewelry, make-up or nail polish.  Do not wear lotions, powders, or perfumes.   Do not shave 48 hours prior to surgery.   Do not bring valuables to the hospital.  [facility] is not responsible for any belongings or valuables.               Contacts, dentures or bridgework may not be worn into surgery.  Leave suitcase in the car. After surgery it may be brought to your room.  For patients admitted to the hospital, discharge time is determined by your treatment team.               Patients discharged the day of surgery will not be allowed to drive home.  Name and phone number of your driver: Family/Friend  Special Instructions: Please shower using CHG soap the night before and the morning of your surgery   Please read over the following fact sheets that you were given: Pain Booklet, Coughing and Deep Breathing and Surgical Site Infection Prevention

## 2013-12-16 ENCOUNTER — Encounter (HOSPITAL_COMMUNITY)
Admission: RE | Admit: 2013-12-16 | Discharge: 2013-12-16 | Disposition: A | Discharge status: Home / Self Care | Payer: BC Managed Care – PPO | Source: Ambulatory Visit | Attending: Surgery | Admitting: Surgery

## 2013-12-16 ENCOUNTER — Encounter (HOSPITAL_COMMUNITY): Payer: Self-pay

## 2013-12-16 ENCOUNTER — Encounter: Payer: Self-pay | Admitting: Neurology

## 2013-12-16 DIAGNOSIS — Z01812 Encounter for preprocedural laboratory examination: Secondary | ICD-10-CM | POA: Insufficient documentation

## 2013-12-16 HISTORY — DX: Sleep apnea, unspecified: G47.30

## 2013-12-16 HISTORY — DX: Other constipation: K59.09

## 2013-12-16 LAB — CBC
HCT: 39.2 % (ref 36.0–46.0)
HEMOGLOBIN: 13.2 g/dL (ref 12.0–15.0)
MCH: 27.1 pg (ref 26.0–34.0)
MCHC: 33.7 g/dL (ref 30.0–36.0)
MCV: 80.5 fL (ref 78.0–100.0)
Platelets: 323 10*3/uL (ref 150–400)
RBC: 4.87 MIL/uL (ref 3.87–5.11)
RDW: 13.6 % (ref 11.5–15.5)
WBC: 7.9 10*3/uL (ref 4.0–10.5)

## 2013-12-16 LAB — HCG, SERUM, QUALITATIVE: Preg, Serum: NEGATIVE

## 2013-12-16 NOTE — Progress Notes (Signed)
Patient denied having any cardiac or pulmonary issues. PCP is Dr[facility]igm[name].

## 2013-12-17 ENCOUNTER — Encounter: Payer: Self-pay | Admitting: Neurology

## 2013-12-23 ENCOUNTER — Telehealth (INDEPENDENT_AMBULATORY_CARE_PROVIDER_SITE_OTHER): Payer: Self-pay | Admitting: Surgery

## 2013-12-23 NOTE — Telephone Encounter (Signed)
reschedule sx from 4/21 pt friend past away out of town  sx now 4/29

## 2013-12-31 ENCOUNTER — Encounter (HOSPITAL_COMMUNITY): Payer: Self-pay | Admitting: *Deleted

## 2013-12-31 MED ORDER — CIPROFLOXACIN IN D5W 400 MG/200ML IV SOLN
400.0000 mg | INTRAVENOUS | Status: AC
  Administered 2014-01-01: 400 mg via INTRAVENOUS
  Filled 2013-12-31: qty 200

## 2013-12-31 MED ORDER — CHLORHEXIDINE GLUCONATE 4 % EX LIQD
1.0000 "application " | Freq: Once | CUTANEOUS | Status: DC
  Filled 2013-12-31: qty 15

## 2014-01-01 ENCOUNTER — Encounter (HOSPITAL_COMMUNITY): Payer: BC Managed Care – PPO | Admitting: Anesthesiology

## 2014-01-01 ENCOUNTER — Ambulatory Visit (HOSPITAL_COMMUNITY): Payer: BC Managed Care – PPO

## 2014-01-01 ENCOUNTER — Ambulatory Visit (HOSPITAL_COMMUNITY)
Admission: RE | Admit: 2014-01-01 | Discharge: 2014-01-01 | Disposition: A | Discharge status: Home / Self Care | Payer: BC Managed Care – PPO | Source: Ambulatory Visit | Attending: Surgery | Admitting: Surgery

## 2014-01-01 ENCOUNTER — Encounter (HOSPITAL_COMMUNITY)
Admission: RE | Disposition: A | Discharge status: Home / Self Care | Payer: Self-pay | Source: Ambulatory Visit | Attending: Surgery

## 2014-01-01 ENCOUNTER — Encounter (HOSPITAL_COMMUNITY): Payer: Self-pay | Admitting: *Deleted

## 2014-01-01 ENCOUNTER — Ambulatory Visit (HOSPITAL_COMMUNITY): Payer: BC Managed Care – PPO | Admitting: Anesthesiology

## 2014-01-01 DIAGNOSIS — K8010 Calculus of gallbladder with chronic cholecystitis without obstruction: Secondary | ICD-10-CM

## 2014-01-01 DIAGNOSIS — Z6841 Body Mass Index (BMI) 40.0 and over, adult: Secondary | ICD-10-CM | POA: Insufficient documentation

## 2014-01-01 DIAGNOSIS — K824 Cholesterolosis of gallbladder: Secondary | ICD-10-CM | POA: Insufficient documentation

## 2014-01-01 DIAGNOSIS — G839 Paralytic syndrome, unspecified: Secondary | ICD-10-CM | POA: Insufficient documentation

## 2014-01-01 DIAGNOSIS — F411 Generalized anxiety disorder: Secondary | ICD-10-CM | POA: Insufficient documentation

## 2014-01-01 DIAGNOSIS — G4733 Obstructive sleep apnea (adult) (pediatric): Secondary | ICD-10-CM | POA: Insufficient documentation

## 2014-01-01 DIAGNOSIS — E669 Obesity, unspecified: Secondary | ICD-10-CM | POA: Insufficient documentation

## 2014-01-01 DIAGNOSIS — F3289 Other specified depressive episodes: Secondary | ICD-10-CM | POA: Insufficient documentation

## 2014-01-01 DIAGNOSIS — F329 Major depressive disorder, single episode, unspecified: Secondary | ICD-10-CM | POA: Insufficient documentation

## 2014-01-01 DIAGNOSIS — Z87891 Personal history of nicotine dependence: Secondary | ICD-10-CM | POA: Insufficient documentation

## 2014-01-01 DIAGNOSIS — G47419 Narcolepsy without cataplexy: Secondary | ICD-10-CM | POA: Insufficient documentation

## 2014-01-01 DIAGNOSIS — Z79899 Other long term (current) drug therapy: Secondary | ICD-10-CM | POA: Insufficient documentation

## 2014-01-01 HISTORY — DX: Other complications of anesthesia, initial encounter: T88.59XA

## 2014-01-01 HISTORY — DX: Family history of other specified conditions: Z84.89

## 2014-01-01 HISTORY — DX: Adverse effect of unspecified anesthetic, initial encounter: T41.45XA

## 2014-01-01 HISTORY — PX: CHOLECYSTECTOMY: SHX55

## 2014-01-01 SURGERY — LAPAROSCOPIC CHOLECYSTECTOMY WITH INTRAOPERATIVE CHOLANGIOGRAM
Anesthesia: General | Site: Abdomen

## 2014-01-01 MED ORDER — MIDAZOLAM HCL 2 MG/2ML IJ SOLN
INTRAMUSCULAR | Status: AC
  Filled 2014-01-01: qty 2

## 2014-01-01 MED ORDER — NEOSTIGMINE METHYLSULFATE 1 MG/ML IJ SOLN
INTRAMUSCULAR | Status: DC | PRN
  Administered 2014-01-01: 3 mg via INTRAVENOUS

## 2014-01-01 MED ORDER — ONDANSETRON HCL 4 MG/2ML IJ SOLN
INTRAMUSCULAR | Status: DC | PRN
  Administered 2014-01-01: 4 mg via INTRAVENOUS

## 2014-01-01 MED ORDER — ONDANSETRON HCL 4 MG/2ML IJ SOLN
INTRAMUSCULAR | Status: AC
  Filled 2014-01-01: qty 2

## 2014-01-01 MED ORDER — FENTANYL CITRATE 0.05 MG/ML IJ SOLN
25.0000 ug | INTRAMUSCULAR | Status: DC | PRN
  Administered 2014-01-01: 50 ug via INTRAVENOUS

## 2014-01-01 MED ORDER — SODIUM CHLORIDE 0.9 % IV SOLN
INTRAVENOUS | Status: DC | PRN
  Administered 2014-01-01: 13:00:00

## 2014-01-01 MED ORDER — LIDOCAINE HCL (CARDIAC) 20 MG/ML IV SOLN
INTRAVENOUS | Status: AC
  Filled 2014-01-01: qty 5

## 2014-01-01 MED ORDER — BUPIVACAINE-EPINEPHRINE (PF) 0.25% -1:200000 IJ SOLN
INTRAMUSCULAR | Status: AC
  Filled 2014-01-01: qty 30

## 2014-01-01 MED ORDER — HYDROCODONE-ACETAMINOPHEN 5-325 MG PO TABS
1.0000 | ORAL_TABLET | ORAL | Status: DC | PRN
Start: 2014-01-01 — End: 2014-01-01

## 2014-01-01 MED ORDER — MORPHINE SULFATE 2 MG/ML IJ SOLN
2.0000 mg | INTRAMUSCULAR | Status: DC | PRN
Start: 2014-01-01 — End: 2014-01-01

## 2014-01-01 MED ORDER — HYDROCODONE-ACETAMINOPHEN 5-325 MG PO TABS: 1.0000 | ORAL_TABLET | ORAL | Status: DC | PRN

## 2014-01-01 MED ORDER — ONDANSETRON HCL 4 MG/2ML IJ SOLN: 4.0000 mg | INTRAMUSCULAR | Status: DC | PRN

## 2014-01-01 MED ORDER — ACETAMINOPHEN 160 MG/5ML PO SOLN
325.0000 mg | ORAL | Status: DC | PRN
  Filled 2014-01-01: qty 20.3

## 2014-01-01 MED ORDER — LACTATED RINGERS IV SOLN
INTRAVENOUS | Status: DC
  Administered 2014-01-01 (×2): via INTRAVENOUS

## 2014-01-01 MED ORDER — ONDANSETRON 4 MG PO TBDP: 4.0000 mg | ORAL_TABLET | Freq: Three times a day (TID) | ORAL | Status: DC | PRN

## 2014-01-01 MED ORDER — FENTANYL CITRATE 0.05 MG/ML IJ SOLN
INTRAMUSCULAR | Status: AC
  Filled 2014-01-01: qty 2

## 2014-01-01 MED ORDER — FENTANYL CITRATE 0.05 MG/ML IJ SOLN
INTRAMUSCULAR | Status: DC | PRN
  Administered 2014-01-01 (×5): 50 ug via INTRAVENOUS

## 2014-01-01 MED ORDER — OXYCODONE HCL 5 MG/5ML PO SOLN: 5.0000 mg | Freq: Once | ORAL | Status: DC | PRN

## 2014-01-01 MED ORDER — 0.9 % SODIUM CHLORIDE (POUR BTL) OPTIME
TOPICAL | Status: DC | PRN
  Administered 2014-01-01: 1000 mL

## 2014-01-01 MED ORDER — LIDOCAINE HCL (CARDIAC) 20 MG/ML IV SOLN
INTRAVENOUS | Status: DC | PRN
  Administered 2014-01-01: 80 mg via INTRAVENOUS

## 2014-01-01 MED ORDER — PROPOFOL 10 MG/ML IV BOLUS
INTRAVENOUS | Status: DC | PRN
  Administered 2014-01-01: 180 mg via INTRAVENOUS

## 2014-01-01 MED ORDER — FENTANYL CITRATE 0.05 MG/ML IJ SOLN
INTRAMUSCULAR | Status: AC
  Filled 2014-01-01: qty 5

## 2014-01-01 MED ORDER — OXYCODONE HCL 5 MG PO TABS: 5.0000 mg | ORAL_TABLET | Freq: Once | ORAL | Status: DC | PRN

## 2014-01-01 MED ORDER — DEXAMETHASONE SODIUM PHOSPHATE 10 MG/ML IJ SOLN
INTRAMUSCULAR | Status: DC | PRN
  Administered 2014-01-01: 4 mg via INTRAVENOUS

## 2014-01-01 MED ORDER — PROPOFOL 10 MG/ML IV BOLUS
INTRAVENOUS | Status: AC
  Filled 2014-01-01: qty 20

## 2014-01-01 MED ORDER — PROMETHAZINE HCL 25 MG/ML IJ SOLN: 6.2500 mg | INTRAMUSCULAR | Status: DC | PRN

## 2014-01-01 MED ORDER — ACETAMINOPHEN 325 MG PO TABS: 325.0000 mg | ORAL_TABLET | ORAL | Status: DC | PRN

## 2014-01-01 MED ORDER — GLYCOPYRROLATE 0.2 MG/ML IJ SOLN
INTRAMUSCULAR | Status: DC | PRN
  Administered 2014-01-01: 0.4 mg via INTRAVENOUS

## 2014-01-01 MED ORDER — ROCURONIUM BROMIDE 50 MG/5ML IV SOLN
INTRAVENOUS | Status: AC
  Filled 2014-01-01: qty 1

## 2014-01-01 MED ORDER — ROCURONIUM BROMIDE 100 MG/10ML IV SOLN
INTRAVENOUS | Status: DC | PRN
  Administered 2014-01-01: 50 mg via INTRAVENOUS

## 2014-01-01 MED ORDER — SCOPOLAMINE 1 MG/3DAYS TD PT72
MEDICATED_PATCH | TRANSDERMAL | Status: AC
  Filled 2014-01-01: qty 1

## 2014-01-01 MED ORDER — SODIUM CHLORIDE 0.9 % IR SOLN
Status: DC | PRN
  Administered 2014-01-01: 1000 mL

## 2014-01-01 MED ORDER — BUPIVACAINE-EPINEPHRINE 0.25% -1:200000 IJ SOLN
INTRAMUSCULAR | Status: DC | PRN
  Administered 2014-01-01: 17 mL

## 2014-01-01 MED ORDER — MIDAZOLAM HCL 5 MG/5ML IJ SOLN
INTRAMUSCULAR | Status: DC | PRN
  Administered 2014-01-01: 2 mg via INTRAVENOUS

## 2014-01-01 SURGICAL SUPPLY — 44 items
APPLIER CLIP ROT 10 11.4 M/L (STAPLE) ×3
BENZOIN TINCTURE PRP APPL 2/3 (GAUZE/BANDAGES/DRESSINGS) ×3 IMPLANT
BLADE SURG ROTATE 9660 (MISCELLANEOUS) IMPLANT
CANISTER SUCTION 2500CC (MISCELLANEOUS) ×3 IMPLANT
CHLORAPREP W/TINT 26ML (MISCELLANEOUS) ×3 IMPLANT
CLIP APPLIE ROT 10 11.4 M/L (STAPLE) ×1 IMPLANT
COVER MAYO STAND STRL (DRAPES) ×3 IMPLANT
COVER SURGICAL LIGHT HANDLE (MISCELLANEOUS) ×3 IMPLANT
DRAPE C-ARM 42X72 X-RAY (DRAPES) ×3 IMPLANT
DRAPE UTILITY 15X26 W/TAPE STR (DRAPE) ×6 IMPLANT
DRSG TEGADERM 2-3/8X2-3/4 SM (GAUZE/BANDAGES/DRESSINGS) ×9 IMPLANT
DRSG TEGADERM 4X4.75 (GAUZE/BANDAGES/DRESSINGS) ×3 IMPLANT
ELECT REM PT RETURN 9FT ADLT (ELECTROSURGICAL) ×3
ELECTRODE REM PT RTRN 9FT ADLT (ELECTROSURGICAL) ×1 IMPLANT
FILTER SMOKE EVAC LAPAROSHD (FILTER) ×3 IMPLANT
GAUZE SPONGE 2X2 8PLY STRL LF (GAUZE/BANDAGES/DRESSINGS) ×1 IMPLANT
GLOVE BIO SURGEON STRL SZ7 (GLOVE) ×3 IMPLANT
GLOVE BIOGEL PI IND STRL 6.5 (GLOVE) ×1 IMPLANT
GLOVE BIOGEL PI IND STRL 7.0 (GLOVE) ×2 IMPLANT
GLOVE BIOGEL PI IND STRL 7.5 (GLOVE) ×1 IMPLANT
GLOVE BIOGEL PI INDICATOR 6.5 (GLOVE) ×2
GLOVE BIOGEL PI INDICATOR 7.0 (GLOVE) ×4
GLOVE BIOGEL PI INDICATOR 7.5 (GLOVE) ×2
GLOVE SURG SS PI 7.0 STRL IVOR (GLOVE) ×3 IMPLANT
GOWN STRL REUS W/ TWL LRG LVL3 (GOWN DISPOSABLE) ×4 IMPLANT
GOWN STRL REUS W/TWL LRG LVL3 (GOWN DISPOSABLE) ×8
KIT BASIN OR (CUSTOM PROCEDURE TRAY) ×3 IMPLANT
KIT ROOM TURNOVER OR (KITS) ×3 IMPLANT
NS IRRIG 1000ML POUR BTL (IV SOLUTION) ×3 IMPLANT
PAD ARMBOARD 7.5X6 YLW CONV (MISCELLANEOUS) ×3 IMPLANT
POUCH SPECIMEN RETRIEVAL 10MM (ENDOMECHANICALS) ×3 IMPLANT
SCISSORS LAP 5X35 DISP (ENDOMECHANICALS) ×3 IMPLANT
SET CHOLANGIOGRAPH 5 50 .035 (SET/KITS/TRAYS/PACK) ×3 IMPLANT
SET IRRIG TUBING LAPAROSCOPIC (IRRIGATION / IRRIGATOR) ×3 IMPLANT
SLEEVE ENDOPATH XCEL 5M (ENDOMECHANICALS) ×3 IMPLANT
SPECIMEN JAR SMALL (MISCELLANEOUS) ×3 IMPLANT
SPONGE GAUZE 2X2 STER 10/PKG (GAUZE/BANDAGES/DRESSINGS) ×2
SUT MNCRL AB 4-0 PS2 18 (SUTURE) ×3 IMPLANT
TOWEL OR 17X24 6PK STRL BLUE (TOWEL DISPOSABLE) ×3 IMPLANT
TOWEL OR 17X26 10 PK STRL BLUE (TOWEL DISPOSABLE) ×3 IMPLANT
TRAY LAPAROSCOPIC (CUSTOM PROCEDURE TRAY) ×3 IMPLANT
TROCAR XCEL BLUNT TIP 100MML (ENDOMECHANICALS) ×3 IMPLANT
TROCAR XCEL NON-BLD 11X100MML (ENDOMECHANICALS) ×3 IMPLANT
TROCAR XCEL NON-BLD 5MMX100MML (ENDOMECHANICALS) ×3 IMPLANT

## 2014-01-01 NOTE — Progress Notes (Signed)
Dr. Maple HudsonMoser aware that CBC and HCG were done on 12/16/13 which are past 2 weeks by a couple days. Dr. Maple HudsonMoser okay with not repeating labs unless pt thinks their is a chance she could be pregnant and would like pregnancy test repeated.

## 2014-01-01 NOTE — Anesthesia Postprocedure Evaluation (Signed)
  Anesthesia Post-op Note  Patient: Meagan [name]  Procedure(s) Performed: Procedure(s): LAPAROSCOPIC CHOLECYSTECTOMY WITH INTRAOPERATIVE CHOLANGIOGRAM (N/A)  Patient Location: PACU  Anesthesia Type:General  Level of Consciousness: awake and alert   Airway and Oxygen Therapy: Patient Spontanous Breathing  Post-op Pain: mild  Post-op Assessment: Post-op Vital signs reviewed, Patient's Cardiovascular Status Stable, Respiratory Function Stable, Patent Airway, No signs of Nausea or vomiting and Pain level controlled  Post-op Vital Signs: Reviewed and stable  Last Vitals:  Filed Vitals:   01/01/14 1420  BP: 108/70  Pulse: 73  Temp:   Resp: 12    Complications: No apparent anesthesia complications

## 2014-01-01 NOTE — Anesthesia Preprocedure Evaluation (Addendum)
Anesthesia Evaluation  Patient identified by MRN, date of birth, ID band Patient awake    Reviewed: Allergy & Precautions, H&P , NPO status , Patient's Chart, lab work & pertinent test results  History of Anesthesia Complications (+) PONV and history of anesthetic complications  Airway Mallampati: II TM Distance: >3 FB Neck ROM: Full    Dental  (+) Teeth Intact   Pulmonary sleep apnea and Continuous Positive Airway Pressure Ventilation , neg COPDformer smoker,  breath sounds clear to auscultation        Cardiovascular negative cardio ROS  Rhythm:Regular Rate:Normal     Neuro/Psych PSYCHIATRIC DISORDERS Anxiety Depression negative neurological ROS     GI/Hepatic GERD-  ,  Endo/Other  negative endocrine ROS  Renal/GU negative Renal ROS     Musculoskeletal negative musculoskeletal ROS (+)   Abdominal   Peds  Hematology negative hematology ROS (+)   Anesthesia Other Findings   Reproductive/Obstetrics Neg Hcg 2 weeks ago, on birth control                          Anesthesia Physical Anesthesia Plan  ASA: II  Anesthesia Plan: General   Post-op Pain Management:    Induction: Intravenous  Airway Management Planned: Oral ETT  Additional Equipment: None  Intra-op Plan:   Post-operative Plan: Extubation in OR  Informed Consent: I have reviewed the patients History and Physical, chart, labs and discussed the procedure including the risks, benefits and alternatives for the proposed anesthesia with the patient or authorized representative who has indicated his/her understanding and acceptance.   Dental advisory given  Plan Discussed with: CRNA and Surgeon  Anesthesia Plan Comments:         Anesthesia Quick Evaluation

## 2014-01-01 NOTE — Anesthesia Procedure Notes (Addendum)
Procedure Name: Intubation Date/Time: 01/01/2014 12:06 PM Performed by: Sharlene DoryWALKER, Laquasha Groome E Pre-anesthesia Checklist: Patient identified, Emergency Drugs available, Suction available, Patient being monitored and Timeout performed Patient Re-evaluated:Patient Re-evaluated prior to inductionOxygen Delivery Method: Circle system utilized Preoxygenation: Pre-oxygenation with 100% oxygen Intubation Type: IV induction Ventilation: Mask ventilation without difficulty Grade View: Grade I Tube type: Oral Tube size: 7.5 mm Number of attempts: 1 Airway Equipment and Method: Stylet Placement Confirmation: ETT inserted through vocal cords under direct vision,  positive ETCO2 and breath sounds checked- equal and bilateral Secured at: 21 cm Tube secured with: Tape Dental Injury: Teeth and Oropharynx as per pre-operative assessment  Comments: AOI per Arlice Coltarrie Maness, CRNA.

## 2014-01-01 NOTE — H&P (Signed)
HPI  Meagan [name] is a 38 y.o. female. Referred by Dr. Devoria Albe for evaluation of gallbladder disease  HPI  This is a 38 year old female who presents with a one-year history of intermittent epigastric abdominal pain. This seemed to get worse with feeding hamburgers so she stopped eating burgers. Recently she had a severe attack of epigastric pain that radiated through to her back. She had some nausea and has had vomiting with some of the previous attacks. She does report occasional diarrhea. She does report abdominal bloating. She had a previous ultrasound which showed no sign of gallstones but with her recent emergency department visit had a repeat ultrasound. This showed a single shadowing gallstone. Her lipase was mildly elevated but her liver function tests were normal. She continues to have mild symptoms. She presents now for surgical evaluation for cholecystectomy.  Past Medical History   Diagnosis  Date   .  Depression    .  Anxiety    .  Narcolepsy    .  Sleep paralysis      at times   .  Vivid dream    .  Uncontrolled narcolepsy  04/03/2013     Diagnosed 2002 , by Dr Jetty Duhamel. Meanwhile Medications have become ineffective and patient gained 100 pounds, developed OSA, now on CPAP 9 cm, MSLT to follow in 04-19-13.    Past Surgical History   Procedure  Laterality  Date   .  Tonsillectomy and adenoidectomy   72     age 89    Family History   Problem  Relation  Age of Onset   .  Alcohol abuse  Mother    .  Depression  Mother    .  Anxiety disorder  Mother    .  Sleep apnea  Mother    .  Alcoholism  Mother    .  Alcohol abuse  Father    .  Alcoholism  Father    .  Alcohol abuse  Brother    .  Alcoholism  Brother    .  Multiple sclerosis  Paternal Uncle    Social History  History   Substance Use Topics   .  Smoking status:  Former Smoker     Start date:  09/05/2001   .  Smokeless tobacco:  Not on file      Comment: 10 years ago   .  Alcohol Use:  Yes      Comment:  occasionally,one 3-7 oz of wine per week    Allergies   Allergen  Reactions   .  Amoxicillin  Itching and Rash    Current Outpatient Prescriptions   Medication  Sig  Dispense  Refill   .  bismuth subsalicylate (PEPTO BISMOL) 262 MG/15ML suspension  Take 30 mLs by mouth every 6 (six) hours as needed for indigestion.     .  calcium carbonate (TUMS - DOSED IN MG ELEMENTAL CALCIUM) 500 MG chewable tablet  Chew 1 tablet by mouth 2 (two) times daily as needed for indigestion or heartburn.     .  desogestrel-ethinyl estradiol (KARIVA,AZURETTE,MIRCETTE) 0.15-0.02/0.01 MG (21/5) tablet  Take 1 tablet by mouth daily.     .  ergocalciferol (VITAMIN D2) 50000 UNITS capsule  Take 50,000 Units by mouth once a week.     .  thiamine (VITAMIN B-1) 100 MG tablet  Take 100 mg by mouth daily.     .  ondansetron (ZOFRAN ODT) 4 MG disintegrating tablet  Take  1 tablet (4 mg total) by mouth every 8 (eight) hours as needed for nausea or vomiting.  10 tablet  0   .  oxyCODONE-acetaminophen (PERCOCET/ROXICET) 5-325 MG per tablet  Take 1 tablet by mouth every 6 (six) hours as needed for severe pain.  15 tablet  0    No current facility-administered medications for this visit.   Review of Systems  Review of Systems  Constitutional: Negative for fever, chills and unexpected weight change.  HENT: Negative for congestion, hearing loss, sore throat, trouble swallowing and voice change.  Eyes: Negative for visual disturbance.  Respiratory: Negative for cough and wheezing.  Cardiovascular: Positive for chest pain. Negative for palpitations and leg swelling.  Gastrointestinal: Positive for nausea, vomiting, abdominal pain, diarrhea and abdominal distention. Negative for constipation, blood in stool and anal bleeding.  Genitourinary: Negative for hematuria, vaginal bleeding and difficulty urinating.  Musculoskeletal: Negative for arthralgias.  Skin: Negative for rash and wound.  Neurological: Negative for seizures, syncope  and headaches.  Hematological: Negative for adenopathy. Does not bruise/bleed easily.  Psychiatric/Behavioral: Negative for confusion.  Blood pressure 140/84, pulse 78, temperature 98 F (36.7 C), temperature source Oral, resp. rate 16, height 5' (1.524 m), weight 236 lb (107.049 kg), last menstrual period 11/16/2013.  Physical Exam  Physical Exam  WDWN in NAD  HEENT: EOMI, sclera anicteric  Neck: No masses, no thyromegaly  Lungs: CTA bilaterally; normal respiratory effort  CV: Regular rate and rhythm; no murmurs  Abd: +bowel sounds, obese, soft, non-tender; no palpable masses  Ext: Well-perfused; no edema  Skin: Warm, dry; no sign of jaundice  Data Reviewed  [place] Abdomen Complete  11/17/2013 CLINICAL DATA: Epigastric pain EXAM: ULTRASOUND ABDOMEN COMPLETE COMPARISON: Prior abdominal ultrasound 06/21/2013 FINDINGS: Gallbladder: There is a single echogenic focus within the gallbladder neck which demonstrates posterior acoustic shadowing consistent with cholelithiasis. No gallbladder wall thickening, pericholecystic fluid or positive sonographic Murphy sign (per the sonographer). Common bile duct: Diameter: Within normal limits at 3.3 mm in diameter. Liver: No focal lesion identified. Within normal limits in parenchymal echogenicity. IVC: No abnormality visualized. Pancreas: Visualized portion unremarkable. Spleen: Size and appearance within normal limits. Right Kidney: Length: 10.3 cm. Echogenicity within normal limits. No mass or hydronephrosis visualized. Left Kidney: Length: 10.8 cm. Echogenicity within normal limits. No mass or hydronephrosis visualized. Abdominal aorta: No aneurysm visualized. Other findings: None. IMPRESSION: 1. Cholelithiasis without sonographic evidence of acute cholecystitis. 2. Otherwise, unremarkable right upper quadrant ultrasound. Electronically Signed By: Malachy MoanHeath McCullough M.D. On: 11/17/2013 08:46  Lab Results   Component  Value  Date    WBC  14.8*  11/17/2013    HGB   13.0  11/17/2013    HCT  38.2  11/17/2013    MCV  79.6  11/17/2013    PLT  370  11/17/2013    Lab Results   Component  Value  Date    CREATININE  0.87  11/17/2013    BUN  13  11/17/2013    NA  140  11/17/2013    K  3.9  11/17/2013    CL  101  11/17/2013    CO2  22  11/17/2013    Lab Results   Component  Value  Date    ALT  15  11/17/2013    AST  29  11/17/2013    ALKPHOS  78  11/17/2013    BILITOT  0.3  11/17/2013    Lab Results   Component  Value  Date  LIPASE  88*  11/17/2013   Assessment  Chronic calculus cholecystitis  Plan  Laparoscopic cholecystectomy with intraoperative cholangiogram.  The surgical procedure has been discussed with the patient. Potential risks, benefits, alternative treatments, and expected outcomes have been explained. All of the patient's questions at this time have been answered. The likelihood of reaching the patient's treatment goal is good. The patient understand the proposed surgical procedure and wishes to proceed.    Wilmon ArmsMatthew K. Corliss Skainssuei, MD, [facility]  General/ Trauma Surgery  01/01/2014 10:00 AM

## 2014-01-01 NOTE — Transfer of Care (Signed)
Immediate Anesthesia Transfer of Care Note  Patient: Meagan [name]  Procedure(s) Performed: Procedure(s): LAPAROSCOPIC CHOLECYSTECTOMY WITH INTRAOPERATIVE CHOLANGIOGRAM (N/A)  Patient Location: PACU  Anesthesia Type:General  Level of Consciousness: awake, alert  and oriented  Airway & Oxygen Therapy: Patient Spontanous Breathing and Patient connected to nasal cannula oxygen  Post-op Assessment: Report given to PACU RN, Post -op Vital signs reviewed and stable and Patient moving all extremities X 4  Post vital signs: Reviewed and stable  Complications: No apparent anesthesia complications

## 2014-01-01 NOTE — Op Note (Signed)
Laparoscopic Cholecystectomy with IOC Procedure Note  Indications: This patient presents with symptomatic gallbladder disease and will undergo laparoscopic cholecystectomy.  Pre-operative Diagnosis: Calculus of gallbladder with other cholecystitis, without mention of obstruction  Post-operative Diagnosis: Same  Surgeon: Wilmon ArmsMatthew K. Darrel Gloss   Assistants: none  Anesthesia: General endotracheal anesthesia  ASA Class: 2E  Procedure Details  The patient was seen again in the Holding Room. The risks, benefits, complications, treatment options, and expected outcomes were discussed with the patient. The possibilities of reaction to medication, pulmonary aspiration, perforation of viscus, bleeding, recurrent infection, finding a normal gallbladder, the need for additional procedures, failure to diagnose a condition, the possible need to convert to an open procedure, and creating a complication requiring transfusion or operation were discussed with the patient. The likelihood of improving the patient's symptoms with return to their baseline status is good.  The patient and/or family concurred with the proposed plan, giving informed consent. The site of surgery properly noted. The patient was taken to Operating Room, identified as Meagan [name] and the procedure verified as Laparoscopic Cholecystectomy with Intraoperative Cholangiogram. A Time Out was held and the above information confirmed.  Prior to the induction of general anesthesia, antibiotic prophylaxis was administered. General endotracheal anesthesia was then administered and tolerated well. After the induction, the abdomen was prepped with Chloraprep and draped in the sterile fashion. The patient was positioned in the supine position.  Local anesthetic agent was injected into the skin near the umbilicus and an incision made. We dissected down to the abdominal fascia with blunt dissection.  The fascia was incised vertically and we entered the  peritoneal cavity bluntly.  A pursestring suture of 0-Vicryl was placed around the fascial opening.  The Hasson cannula was inserted and secured with the stay suture.  Pneumoperitoneum was then created with CO2 and tolerated well without any adverse changes in the patient's vital signs. An 11-mm port was placed in the subxiphoid position.  Two 5-mm ports were placed in the right upper quadrant. All skin incisions were infiltrated with a local anesthetic agent before making the incision and placing the trocars.   We positioned the patient in reverse Trendelenburg, tilted slightly to the patient's left.  The gallbladder was identified, the fundus grasped and retracted cephalad. There were significant adhesions to the gallbladder.  Adhesions were lysed bluntly and with the electrocautery where indicated, taking care not to injure any adjacent organs or viscus. The infundibulum was grasped and retracted laterally, exposing the peritoneum overlying the triangle of Calot. This was then divided and exposed in a blunt fashion. A critical view of the cystic duct and cystic artery was obtained.  The cystic duct was clearly identified and bluntly dissected circumferentially. The cystic duct was ligated with a clip distally.   An incision was made in the cystic duct and the [facility] cholangiogram catheter introduced. The catheter was secured using a clip. A cholangiogram was then obtained which showed good visualization of the distal and proximal biliary tree with no sign of filling defects or obstruction.  Contrast flowed easily into the duodenum. The catheter was then removed.   The cystic duct was then ligated with clips and divided. The cystic artery was identified, dissected free, ligated with clips and divided as well.   The gallbladder was dissected from the liver bed in retrograde fashion with the electrocautery. The gallbladder was removed and placed in an Endocatch sac. The liver bed was irrigated and inspected.  Hemostasis was achieved with the electrocautery. Copious  irrigation was utilized and was repeatedly aspirated until clear.  The gallbladder and Endocatch sac were then removed through the umbilical port site.  The pursestring suture was used to close the umbilical fascia.    We again inspected the right upper quadrant for hemostasis.  Pneumoperitoneum was released as we removed the trocars.  4-0 Monocryl was used to close the skin.   Benzoin, steri-strips, and clean dressings were applied. The patient was then extubated and brought to the recovery room in stable condition. Instrument, sponge, and needle counts were correct at closure and at the conclusion of the case.   Findings: Cholecystitis with Cholelithiasis  Estimated Blood Loss: Minimal         Drains: none         Specimens: Gallbladder           Complications: None; patient tolerated the procedure well.         Disposition: PACU - hemodynamically stable.         Condition: stable  Wilmon ArmsMatthew K. Corliss Skainssuei, MD, [facility]  General/ Trauma Surgery  01/01/2014 1:11 PM

## 2014-01-01 NOTE — Discharge Instructions (Signed)
[facility] °LAPAROSCOPIC SURGERY: POST OP INSTRUCTIONS °Always review your discharge instruction sheet given to you by the facility where your surgery was performed. °IF YOU HAVE DISABILITY OR FAMILY LEAVE FORMS, YOU MUST BRING THEM TO THE OFFICE FOR PROCESSING.   °DO NOT GIVE THEM TO YOUR DOCTOR. ° °1. A prescription for pain medication will be given to you upon discharge.  Take your pain medication as prescribed, if needed.  If narcotic pain medicine is not needed, then you may take acetaminophen (Tylenol) or ibuprofen (Advil) as needed. °2. Take your usually prescribed medications unless otherwise directed. °3. If you need a refill on your pain medication, please contact your pharmacy.  They will contact our office to request authorization. Prescriptions will not be filled after 5pm or on week-ends. °4. You should follow a light diet the first few days after arrival home, such as soup and crackers, etc.  Be sure to include lots of fluids daily. °5. Most patients will experience some swelling and bruising in the area of the incisions.  Ice packs will help.  Swelling and bruising can take several days to resolve.  °6. It is common to experience some constipation if taking pain medication after surgery.  Increasing fluid intake and taking a stool softener (such as Colace) will usually help or prevent this problem from occurring.  A mild laxative (Milk of Magnesia or Miralax) should be taken according to package instructions if there are no bowel movements after 48 hours. °7. Unless discharge instructions indicate otherwise, you may remove your bandages 48 hours after surgery, and you may shower at that time.  You will have steri-strips (small skin tapes) in place directly over the incision.  These strips should be left on the skin for 7-10 days.  If your surgeon used skin glue on the incision, you may shower in 24 hours.  The glue will flake off over the next 2-3 weeks.  Any sutures or staples  will be removed at the office during your follow-up visit. °8. ACTIVITIES:  You may resume regular (light) daily activities beginning the next day--such as daily self-care, walking, climbing stairs--gradually increasing activities as tolerated.  You may have sexual intercourse when it is comfortable.  Refrain from any heavy lifting or straining until approved by your doctor. °a. You may drive when you are no longer taking prescription pain medication, you can comfortably wear a seatbelt, and you can safely maneuver your car and apply brakes. °b. RETURN TO WORK:   2-3 weeks °9. You should see your doctor in the office for a follow-up appointment approximately 2-3 weeks after your surgery.  Make sure that you call for this appointment within a day or two after you arrive home to insure a convenient appointment time. °10. OTHER INSTRUCTIONS: ________________________________________________________________________ °WHEN TO CALL YOUR DOCTOR: °1. Fever over 101.0 °2. Inability to urinate °3. Continued bleeding from incision. °4. Increased pain, redness, or drainage from the incision. °5. Increasing abdominal pain ° °The clinic staff is available to answer your questions during regular business hours.  Please don’t hesitate to call and ask to speak to one of the nurses for clinical concerns.  If you have a medical emergency, go to the nearest emergency room or call 911.  A surgeon from [facility] is always on call at the hospital. °[address], [address], [place], [place]  [address] ? [address] [address], [place], [place]   [address] °[phone] ? [phone] ? FAX [phone] °Web site:   [email or web address] ° °

## 2014-01-02 ENCOUNTER — Encounter (HOSPITAL_COMMUNITY): Payer: Self-pay | Admitting: Surgery

## 2014-01-06 ENCOUNTER — Encounter (INDEPENDENT_AMBULATORY_CARE_PROVIDER_SITE_OTHER): Payer: Self-pay | Admitting: Surgery

## 2014-01-07 ENCOUNTER — Encounter (INDEPENDENT_AMBULATORY_CARE_PROVIDER_SITE_OTHER): Payer: Self-pay | Admitting: Surgery

## 2014-01-10 ENCOUNTER — Encounter (INDEPENDENT_AMBULATORY_CARE_PROVIDER_SITE_OTHER): Payer: BC Managed Care – PPO | Admitting: Surgery

## 2014-01-17 ENCOUNTER — Ambulatory Visit (INDEPENDENT_AMBULATORY_CARE_PROVIDER_SITE_OTHER): Payer: BC Managed Care – PPO | Admitting: Surgery

## 2014-01-17 ENCOUNTER — Encounter (INDEPENDENT_AMBULATORY_CARE_PROVIDER_SITE_OTHER): Payer: Self-pay | Admitting: Surgery

## 2014-01-17 VITALS — BP 132/78 | HR 78 | Temp 97.8°F | Resp 14 | Ht 60.0 in | Wt 225.6 lb

## 2014-01-17 DIAGNOSIS — K8010 Calculus of gallbladder with chronic cholecystitis without obstruction: Secondary | ICD-10-CM

## 2014-01-17 NOTE — Progress Notes (Signed)
S/p lap chole with IOC on 01/01/14 for significant chronic cholecystitis.  She is feeling well other than some constipation.  Incisions healed with no sign of infection.  Regular diet.  Full activity.  Follow-up PRN.  Wilmon ArmsMatthew K. Corliss Skainssuei, MD, [facility]  General/ Trauma Surgery  01/17/2014 9:24 AM

## 2014-02-05 ENCOUNTER — Encounter (INDEPENDENT_AMBULATORY_CARE_PROVIDER_SITE_OTHER): Payer: Self-pay | Admitting: Surgery

## 2014-05-19 ENCOUNTER — Encounter (INDEPENDENT_AMBULATORY_CARE_PROVIDER_SITE_OTHER): Payer: Self-pay | Admitting: Neurology

## 2014-05-19 ENCOUNTER — Telehealth: Payer: Self-pay | Admitting: *Deleted

## 2014-05-19 ENCOUNTER — Ambulatory Visit (INDEPENDENT_AMBULATORY_CARE_PROVIDER_SITE_OTHER): Payer: BC Managed Care – PPO | Admitting: Adult Health

## 2014-05-19 ENCOUNTER — Encounter: Payer: Self-pay | Admitting: Neurology

## 2014-05-19 DIAGNOSIS — G4710 Hypersomnia, unspecified: Secondary | ICD-10-CM

## 2014-05-19 DIAGNOSIS — G4719 Other hypersomnia: Secondary | ICD-10-CM

## 2014-05-19 DIAGNOSIS — Z0289 Encounter for other administrative examinations: Secondary | ICD-10-CM

## 2014-05-19 DIAGNOSIS — G4733 Obstructive sleep apnea (adult) (pediatric): Secondary | ICD-10-CM

## 2014-05-19 DIAGNOSIS — Z9989 Dependence on other enabling machines and devices: Principal | ICD-10-CM

## 2014-05-19 MED ORDER — AMPHETAMINE-DEXTROAMPHET ER 10 MG PO CP24: 10.0000 mg | ORAL_CAPSULE | Freq: Every day | ORAL | Status: DC

## 2014-05-19 NOTE — Progress Notes (Deleted)
PATIENT: Almarie Kurdziel Sloop DOB: Jan 16, 1976  REASON FOR VISIT: follow up HISTORY FROM: patient  HISTORY OF PRESENT ILLNESS: is a ___ year old female with history of obstructive sleep Apnea since____. She returns today for a 90 day compliance download. She brought her machine with her today and the reports shows an AHI of ___ at __ cm of water, uses her machine for ___ hours a night, with ___ compliance. Her Epworth score is __ points was previously 19 points. Her fatigue severity score is  was previously 57. Her depression score is __. Patient reports that she gets about ___ hours of sleep a night. She goes to bed around __ and arises at ___. She denies having trouble falling asleep or staying a sleep. States that the she gets up about ___ times a night to urinate. Overall patient feels that CPAP has improved her sleepiness and fatigue. Since the last visit the patient has had no new medical issues   REVIEW OF SYSTEMS: Full 14 system review of systems performed and notable only for:  Constitutional: N/A  Eyes: N/A Ear/Nose/Throat: N/A  Skin: N/A  Cardiovascular: N/A  Respiratory: N/A  Gastrointestinal: N/A  Genitourinary: N/A Hematology/Lymphatic: N/A  Endocrine: N/A Musculoskeletal:N/A  Allergy/Immunology: N/A  Neurological: N/A Psychiatric: N/A Sleep: N/A   ALLERGIES: Allergies  Allergen Reactions  . Amoxicillin Itching and Rash    HOME MEDICATIONS: Outpatient Prescriptions Prior to Visit  Medication Sig Dispense Refill  . desogestrel-ethinyl estradiol (KARIVA,AZURETTE,MIRCETTE) 0.15-0.02/0.01 MG (21/5) tablet Take 1 tablet by mouth daily.      . ergocalciferol (VITAMIN D2) 50000 UNITS capsule Take 50,000 Units by mouth once a week.       No facility-administered medications prior to visit.    PAST MEDICAL HISTORY: Past Medical History  Diagnosis Date  . Depression   . Anxiety   . Narcolepsy   . Sleep paralysis     at times  . Vivid dream   . Uncontrolled  narcolepsy 04/03/2013    Diagnosed 2002 , by Dr Jetty Duhamel. Meanwhile  Medications have become ineffective and patient gained 100 pounds, developed OSA, now on CPAP 9 cm, MSLT to follow in 04-19-13.   . Sleep apnea     wears CPAP nightly  . Constipation, chronic   . Complication of anesthesia     post -op nausea and vomiting at age 38  . Family history of anesthesia complication     cousin woke-up during procedure    PAST SURGICAL HISTORY: Past Surgical History  Procedure Laterality Date  . Tonsillectomy and adenoidectomy  1981    age 55  . Vaginal delivery    . Cholecystectomy N/A 01/01/2014    Procedure: LAPAROSCOPIC CHOLECYSTECTOMY WITH INTRAOPERATIVE CHOLANGIOGRAM;  Surgeon: Wilmon Arms. Corliss Skains, MD;  Location: [facility] OR;  Service: General;  Laterality: N/A;    FAMILY HISTORY: Family History  Problem Relation Age of Onset  . Alcohol abuse Mother   . Depression Mother   . Anxiety disorder Mother   . Sleep apnea Mother   . Alcoholism Mother   . Alcohol abuse Father   . Alcoholism Father   . Alcohol abuse Brother   . Alcoholism Brother   . Multiple sclerosis Paternal Uncle     SOCIAL HISTORY: History   Social History  . Marital Status: Single    Spouse Name: N/A    Number of Children: 1  . Years of Education: some col.   Occupational History  .  [employer]   Social History Main Topics  . Smoking status: Former Smoker    Start date: 09/05/2001  . Smokeless tobacco: Never Used     Comment: 10 years ago  . Alcohol Use: Yes     Comment: occasionally,one 3-7 oz of wine per week  . Drug Use: No  . Sexual Activity: Not Currently    Birth Control/ Protection: Pill     Comment: kariva   Other Topics Concern  . Not on file   Social History Narrative   Patient is single and lives at home, her daughter lives with her.   Patient has one child.   Patient is working full-time.   Patient has some college education.   Patient is left handed.   Patient does  not drink any caffeine.      PHYSICAL EXAM  Filed Vitals:   05/19/14 1259  BP: 130/90  Pulse: 91  Height: 5' 1.5" (1.562 m)  Weight: 225 lb (102.059 kg)   Body mass index is 41.83 kg/(m^2).  Generalized: Well developed, in no acute distress  Head: normocephalic and atraumatic. Oropharynx benign  Neck: Supple, no carotid bruits  Cardiac: Regular rate rhythm, no murmur  Musculoskeletal: No deformity   Neurological examination  Mentation: Alert oriented to time, place, history taking. Follows all commands speech and language fluent Cranial nerve II-XII: Fundoscopic exam reveals sharp disc margins.Pupils were equal round reactive to light extraocular movements were full, visual field were full on confrontational test. Facial sensation and strength were normal. hearing was intact to finger rubbing bilaterally. Uvula tongue midline. Head turning and shoulder shrug  were normal and symmetric.Tongue protrusion into cheek strength was normal. Motor: The motor testing reveals 5 over 5 strength of all 4 extremities. Good symmetric motor tone is noted throughout.  Sensory: Sensory testing is intact to pinprick, soft touch, vibration sensation, and position sense on all 4 extremities. No evidence of extinction is noted.  Coordination: Cerebellar testing reveals good finger-nose-finger and heel-to-shin bilaterally.  Gait and station: Gait is normal. Tandem gait is normal. Romberg is negative. No drift is seen.  Reflexes: Deep tendon reflexes are symmetric and normal bilaterally. Toes are downgoing bilaterally.   DIAGNOSTIC DATA (LABS, IMAGING, TESTING) - I reviewed patient records, labs, notes, testing and imaging myself where available.  Lab Results  Component Value Date   WBC 7.9 12/16/2013   HGB 13.2 12/16/2013   HCT 39.2 12/16/2013   MCV 80.5 12/16/2013   PLT 323 12/16/2013      Component Value Date/Time   NA 140 11/17/2013 0540   K 3.9 11/17/2013 0540   CL 101 11/17/2013 0540   CO2 22  11/17/2013 0540   GLUCOSE 111* 11/17/2013 0540   BUN 13 11/17/2013 0540   CREATININE 0.87 11/17/2013 0540   CALCIUM 9.7 11/17/2013 0540   PROT 7.2 11/17/2013 0540   ALBUMIN 3.5 11/17/2013 0540   AST 29 11/17/2013 0540   ALT 15 11/17/2013 0540   ALKPHOS 78 11/17/2013 0540   BILITOT 0.3 11/17/2013 0540   GFRNONAA 83* 11/17/2013 0540   GFRAA >90 11/17/2013 0540   No results found for this basename: CHOL, HDL, LDLCALC, LDLDIRECT, TRIG, CHOLHDL   No results found for this basename: HGBA1C   No results found for this basename: VITAMINB12   No results found for this basename: TSH      ASSESSMENT AND PLAN 38 y.o. year old female  has a past medical history of Depression; Anxiety; Narcolepsy; Sleep paralysis; Vivid  dream; Uncontrolled narcolepsy (04/03/2013); Sleep apnea; Constipation, chronic; Complication of anesthesia; and Family history of anesthesia complication. here with ***     Butch Penny, MSN, NP-C 05/19/2014, 1:13 PM [facility] [address] [place], [place] [address] [phone]  Note: This document was prepared with digital dictation and possible smart phrase technology. Any transcriptional errors that result from this process are unintentional.

## 2014-05-19 NOTE — Patient Instructions (Signed)
Amphetamine; Dextroamphetamine extended-release capsules What is this medicine? AMPHETAMINE; DEXTROAMPHETAMINE (am FET a meen; dex troe am FET a meen) is used to treat attention-deficit hyperactivity disorder (ADHD). Federal law prohibits giving this medicine to any person other than the person for whom it was prescribed. Do not share this medicine with anyone else. This medicine may be used for other purposes; ask your health care provider or pharmacist if you have questions. COMMON BRAND NAME(S): Adderall XR What should I tell my health care provider before I take this medicine? They need to know if you have any of these conditions: -anxiety or panic attacks -circulation problems in fingers and toes -glaucoma -hardening or blockages of the arteries or heart blood vessels -heart disease or a heart defect -high blood pressure -history of a drug or alcohol abuse problem -history of stroke -kidney disease -liver disease -mental illness -seizures -suicidal thoughts, plans, or attempt; a previous suicide attempt by you or a family member -thyroid disease -Tourette's syndrome -an unusual or allergic reaction to dextroamphetamine, other amphetamines, other medicines, foods, dyes, or preservatives -pregnant or trying to get pregnant -breast-feeding How should I use this medicine? Take this medicine by mouth with a glass of water. Follow the directions on the prescription label. This medicine is taken just one time per day, usually in the morning after waking up. Take with or without food. Do not chew or crush this medicine. You may open the capsules and sprinkle the medicine onto a spoon of applesauce. If sprinkled on applesauce, take the dose immediately and do not crush or chew. Always drink a glass of water or other liquid after taking this medicine. Do not take your medicine more often than directed. A special MedGuide will be given to you by the pharmacist with each prescription and refill.  Be sure to read this information carefully each time. Talk to your pediatrician regarding the use of this medicine in children. While this drug may be prescribed for children as young as 6 years for selected conditions, precautions do apply. Overdosage: If you think you have taken too much of this medicine contact a poison control center or emergency room at once. NOTE: This medicine is only for you. Do not share this medicine with others. What if I miss a dose? If you miss a dose, take it as soon as you can. If it is almost time for your next dose, take only that dose. Do not take double or extra doses. What may interact with this medicine? Do not take this medicine with any of the following medications: -certain medicines for migraine headache like almotriptan, eletriptan, frovatriptan, naratriptan, rizatriptan, sumatriptan, zolmitriptan -MAOIs like Carbex, Eldepryl, Marplan, Nardil, and Parnate -meperidine -other stimulant medicines for attention disorders, weight loss, or to stay awake -pimozide -procarbazine This medicine may also interact with the following medications: -acetazolamide -ammonium chloride -antacids -ascorbic acid -atomoxetine -caffeine -certain medicines for blood pressure -certain medicines for depression, anxiety, or psychotic disturbances -certain medicines for diabetes -certain medicines for seizures like carbamazepine, phenobarbital, phenytoin -certain medicines for stomach problems like cimetidine, famotidine, omeprazole, lansoprazole -cold or allergy medicines -glutamic acid -methenamine -narcotic medicines for pain -norepinephrine -phenothiazines like chlorpromazine, mesoridazine, prochlorperazine, thioridazine -sodium acid phosphate -sodium bicarbonate This list may not describe all possible interactions. Give your health care provider a list of all the medicines, herbs, non-prescription drugs, or dietary supplements you use. Also tell them if you  smoke, drink alcohol, or use illegal drugs. Some items may interact with your medicine. What should   I watch for while using this medicine? Visit your doctor or health care professional for regular checks on your progress. This prescription requires that you follow special procedures with your doctor and pharmacy. You will need to have a new written prescription from your doctor every time you need a refill. This medicine may affect your concentration, or hide signs of tiredness. Until you know how this medicine affects you, do not drive, ride a bicycle, use machinery, or do anything that needs mental alertness. Tell your doctor or health care professional if this medicine loses its effects, or if you feel you need to take more than the prescribed amount. Do not change the dosage without talking to your doctor or health care professional. Decreased appetite is a common side effect when starting this medicine. Eating small, frequent meals or snacks can help. Talk to your doctor if you continue to have poor eating habits. Height and weight growth of a child taking this medicine will be monitored closely. Do not take this medicine close to bedtime. It may prevent you from sleeping. If you are going to need surgery, an MRI, a CT scan, or other procedure, tell your doctor that you are taking this medicine. You may need to stop taking this medicine before the procedure. Tell your doctor or healthcare professional right away if you notice unexplained wounds on your fingers and toes while taking this medicine. You should also tell your healthcare provider if you experience numbness or pain, changes in the skin color, or sensitivity to temperature in your fingers or toes. What side effects may I notice from receiving this medicine? Side effects that you should report to your doctor or health care professional as soon as possible: -allergic reactions like skin rash, itching or hives, swelling of the face, lips, or  tongue -changes in vision -chest pain or chest tightness -confusion, trouble speaking or understanding -fast, irregular heartbeat -fingers or toes feel numb, cool, painful -hallucination, loss of contact with reality -high blood pressure -males: prolonged or painful erection -seizures -severe headaches -shortness of breath -suicidal thoughts or other mood changes -trouble walking, dizziness, loss of balance or coordination -uncontrollable head, mouth, neck, arm, or leg movements Side effects that usually do not require medical attention (report to your doctor or health care professional if they continue or are bothersome): -anxious -headache -loss of appetite -nausea, vomiting -trouble sleeping -weight loss This list may not describe all possible side effects. Call your doctor for medical advice about side effects. You may report side effects to FDA at 1-800-FDA-1088. Where should I keep my medicine? Keep out of the reach of children. This medicine can be abused. Keep your medicine in a safe place to protect it from theft. Do not share this medicine with anyone. Selling or giving away this medicine is dangerous and against the law. Store at room temperature between 15 and 30 degrees C (59 and 86 degrees F). Keep container tightly closed. Protect from light. Throw away any unused medicine after the expiration date. NOTE: This sheet is a summary. It may not cover all possible information. If you have questions about this medicine, talk to your doctor, pharmacist, or health care provider.  2015, [employer]. (2013-07-05 18:16:00)  

## 2014-05-19 NOTE — Telephone Encounter (Signed)
Dr. D went home sick. Butch Penny has agreed to see this patient. Voicemail left

## 2014-05-19 NOTE — Progress Notes (Signed)
PATIENT: Meagan [name] DOB: June 12, 1976  REASON FOR VISIT: follow up HISTORY FROM: patient  HISTORY OF PRESENT ILLNESS: Meagan [name] is a 38 year old female with history of obstructive sleep Apnea. She returns today for a 90 day compliance download. She brought her machine with her today and the reports shows an AHI of 0.8 at 9 cm of water, uses her machine for 6:32 hours a night, with 95 % compliance. Her Epworth score is 16 points was previously 19 points. Her fatigue severity score is 60 was previously 57.Overall she feels that the CPAP has not been helpful in treating her daytime sleepiness. She can only drive for about 20 minutes at a time due to falling asleep. She states drinking caffeine does not work. She states that doing the MLST- wearing the mask keeps her awake so narcolepsy testing will be negative. She states that she has tried various medications in the past. She has the best success with adderall XL. She states that she had become used to the drug so it started to not work as well. She has been off the medication for 2 years and would like to try it again.   HISTORY 04/03/13 (CD): The patient had been diagnosed in 2002 by Dr. Jetty Duhamel. She reported taking Provigil but had no response the original diagnosis seems to have been made in 2002. She tried Ritalin after that again without a positive response started on Adderall which helped significantly but she hasn't needed increasing doses of the medication. At the time being that she was taking 40 mrem of Adderall XR divided into doses. Problem with her insurance coverage became evident as they did not want to cover the medicines, and considered the dose of Adderall exceedingly too high for daytime use.  The patient also weaned off Zoloft in preparation for an MS LT. As the discussed at our initial visit I was suspicious that there may be an additional sleep disorder and form of obstructive sleep apnea as the patient had gained over 100  pollens in the period between her to sleep studies now. Indeed she tested positive for obstructive sleep apnea and was placed on CPAP.  The study was read on 02/18/2013 her baseline AHI was 28.4, her RDI is 36.7 in supine her AHI reached 60 and during REM 23.5. She also had frequent limb movements some of this lead to arousal and fragmentation of sleep the index here was 3.8 PLM related arousals. Since being off the Zoloft, her sleep has changed. She noticed having more emotional responses , and she likes this.  Her sleep time and pattern is not changed, only more dreams are noted.  The patient had been told by her mother that as a child she was a restless, kicking sleeper. The knee today 14 days after CPAP initiation to look at the compliance the AHI has been significantly reduced to 1.4 apneas per hour of sleep her CPAP is set at 9 cm water pressure visit centimeter EPR but her average daily usage at this time is only 2 hours and 17 minutes. There is an up going trend noted in her user time and ankle which the patient now that she has received another month to continue using the CPAP for at least 4 hours at night.  As the her initial visit the patient endorses a high Epworth sleepiness score of that CPAP may have not had time yet to kick in. Today's Epworth score is 19 points and her fatigue  severity score is 57 points the patient also has a symptom report of visit ringings sleep paralysis, intrusion of dreams, sleep hallucinations and narcolepsy Will has to be will have to be reevaluated in this patient after 30 days of CPAP he was I feel that he can make this arrangement.today  In general the patient for an MS LT test. Again this will be based on the observation period of at least 30 days of compliant he was told that he can evaluate the CPAP is effective in reducing the daytime sleepiness or not. At this time there has been no positive effect yet.  The patient has joined weight loss programs, the Last was  weight watchers, lost 10 pounds since last visit. Patient's mother and brother have sleep apnea. There is no known family member was narcolepsy. To be a great uncle by history. The patient is aware that her BMI contributes to her sleep apnea problem but the BMI is truly not a manifestation of narcolepsy itself.  Narcoleptic patients is moderate obesity may have both problems due to a lack of Hypocretin, also known as Orexin.  I also advised the patient to contact the narcolepsy net-work for further information.  I suggested that 2 and 5 Day intermittent fast as a diet to promote weight loss, a one day a week fast can be used in nondiabetic patients to maintain weight and stop gaining   REVIEW OF SYSTEMS: Full 14 system review of systems performed and notable only for:  Constitutional: fatigue Eyes: eye itching Ear/Nose/Throat: ringing in ears  Skin: N/A  Cardiovascular: N/A  Respiratory: N/A  Gastrointestinal: N/A  Genitourinary: N/A Hematology/Lymphatic: N/A  Endocrine: N/A Musculoskeletal:joint pain, joint swelling  Allergy/Immunology: N/A  Neurological: memory loss, speech difficulty Psychiatric: agitation, decreased concentration, nervous/anxious Sleep: restless leg, insomnia, apnea, frequent waking, daytime sleepiness, snoring   ALLERGIES: Allergies  Allergen Reactions  . Amoxicillin Itching and Rash    HOME MEDICATIONS: Outpatient Prescriptions Prior to Visit  Medication Sig Dispense Refill  . Cholecalciferol (VITAMIN D3) 2000 UNITS TABS Take 1 tablet by mouth daily.      Marland Kitchen desogestrel-ethinyl estradiol (KARIVA,AZURETTE,MIRCETTE) 0.15-0.02/0.01 MG (21/5) tablet Take 1 tablet by mouth daily.      . fluticasone (FLONASE) 50 MCG/ACT nasal spray Place 1 spray into both nostrils daily.      . meloxicam (MOBIC) 15 MG tablet 1 tablet daily.      Marland Kitchen omeprazole (PRILOSEC) 20 MG capsule 1 capsule daily.       No facility-administered medications prior to visit.    PAST MEDICAL  HISTORY: Past Medical History  Diagnosis Date  . Depression   . Anxiety   . Narcolepsy   . Sleep paralysis     at times  . Vivid dream   . 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 Meanwhile  Medications have become ineffective and patient gained 100 pounds, developed OSA, now on CPAP 9 cm, MSLT to follow in 04-19-13.   . Sleep apnea     wears CPAP nightly  . Constipation, chronic   . Complication of anesthesia     post -op nausea and vomiting at age 54  . Family history of anesthesia complication     cousin woke-up during procedure    PAST SURGICAL HISTORY: Past Surgical History  Procedure Laterality Date  . Tonsillectomy and adenoidectomy  1981    age 19  . Vaginal delivery    . Cholecystectomy N/A 01/01/2014  Procedure: LAPAROSCOPIC CHOLECYSTECTOMY WITH INTRAOPERATIVE CHOLANGIOGRAM;  Surgeon: Wilmon Arms. Corliss Skains, MD;  Location: [facility] OR;  Service: General;  Laterality: N/A;    FAMILY HISTORY: Family History  Problem Relation Age of Onset  . Alcohol abuse Mother   . Depression Mother   . Anxiety disorder Mother   . Sleep apnea Mother   . Alcoholism Mother   . Alcohol abuse Father   . Alcoholism Father   . Alcohol abuse Brother   . Alcoholism Brother   . Multiple sclerosis Paternal Uncle     SOCIAL HISTORY: History   Social History  . Marital Status: Single    Spouse Name: N/A    Number of Children: 1  . Years of Education: some col.   Occupational History  .      [employer]   Social History Main Topics  . Smoking status: Former Smoker    Start date: 09/05/2001  . Smokeless tobacco: Never Used     Comment: 10 years ago  . Alcohol Use: Yes     Comment: occasionally,one 3-7 oz of wine per week  . Drug Use: No  . Sexual Activity: Not Currently    Birth Control/ Protection: Pill     Comment: kariva   Other Topics Concern  . Not on file   Social History Narrative   Patient is single and lives at home, her  daughter lives with her.   Patient has one child.   Patient is working full-time.   Patient has some college education.   Patient is left handed.   Patient does not drink any caffeine.      PHYSICAL EXAM  There were no vitals filed for this visit. There is no weight on file to calculate BMI.  Generalized: Well developed, in no acute distress  Neck: circumference 17 inches, Mallampati 3   Neurological examination  Mentation: Alert oriented to time, place, history taking. Follows all commands speech and language fluent Cranial nerve II-XII: Pupils were equal round reactive to light. Extraocular movements were full, visual field were full on confrontational test. Facial sensation and strength were normal.  Uvula tongue midline. Head turning and shoulder shrug  were normal and symmetric.T Motor: The motor testing reveals 5 over 5 strength of all 4 extremities. Good symmetric motor tone is noted throughout.  Sensory: Sensory testing is intact to soft touch on all 4 extremities. No evidence of extinction is noted.  Coordination: Cerebellar testing reveals good finger-nose-finger and heel-to-shin bilaterally.  Gait and station: Gait is normal.  Reflexes: Deep tendon reflexes are symmetric and normal bilaterally.     DIAGNOSTIC DATA (LABS, IMAGING, TESTING) - I reviewed patient records, labs, notes, testing and imaging myself where available.  Lab Results  Component Value Date   WBC 7.9 12/16/2013   HGB 13.2 12/16/2013   HCT 39.2 12/16/2013   MCV 80.5 12/16/2013   PLT 323 12/16/2013      Component Value Date/Time   NA 140 11/17/2013 0540   K 3.9 11/17/2013 0540   CL 101 11/17/2013 0540   CO2 22 11/17/2013 0540   GLUCOSE 111* 11/17/2013 0540   BUN 13 11/17/2013 0540   CREATININE 0.87 11/17/2013 0540   CALCIUM 9.7 11/17/2013 0540   PROT 7.2 11/17/2013 0540   ALBUMIN 3.5 11/17/2013 0540   AST 29 11/17/2013 0540   ALT 15 11/17/2013 0540   ALKPHOS 78 11/17/2013 0540   BILITOT 0.3 11/17/2013  0540   GFRNONAA 83* 11/17/2013 0540   GFRAA >  90 11/17/2013 0540       ASSESSMENT AND PLAN 38 y.o. year old female  has a past medical history of Depression; Anxiety; Narcolepsy; Sleep paralysis; Vivid dream; Uncontrolled narcolepsy (04/03/2013); Sleep apnea; Constipation, chronic; Complication of anesthesia; and Family history of anesthesia complication. here with:  1. OSA on CPAP 2. Excessive daytime sleepiness  Patient continues to be fatigued and sleepy throughout the day. She is very compliant with her CPAP. She does have some leaks (not nightly) but states that she really likes the mask that she has now and is not interested in changing at this time. In the future she may need to be refitted. For now I will start her on Adderall XR 10 mg daily. She will let us know how if this is beneficial for her. Otherwise she should follow-up in 3 months with Dr. Vickey Huger.     Butch Penny, MSN, NP-C 05/19/2014, 2:43 PM [facility] [address] [place], [place] [address] [phone]  Note: This document was prepared with digital dictation and possible smart phrase technology. Any transcriptional errors that result from this process are unintentional.

## 2014-05-20 NOTE — Progress Notes (Signed)
I agree with the assessment and plan as directed by NP .The patient is known to me .   Prophet Renwick, MD  

## 2014-05-27 NOTE — Progress Notes (Deleted)
Subjective:    Patient ID: Meagan [name] is a 38 y.o. female.  HPI {Common ambulatory SmartLinks:[id number]}  Review of Systems  Objective:  Neurologic Exam  Physical Exam  Assessment:    Plan:

## 2014-05-28 NOTE — Progress Notes (Signed)
Patient was seen in an earlier visit

## 2014-07-02 ENCOUNTER — Other Ambulatory Visit: Payer: Self-pay | Admitting: Adult Health

## 2014-07-02 MED ORDER — AMPHETAMINE-DEXTROAMPHET ER 10 MG PO CP24: 10.0000 mg | ORAL_CAPSULE | Freq: Every day | ORAL | Status: DC

## 2014-07-02 NOTE — Telephone Encounter (Signed)
Request entered, forwarded to provider for approval.  

## 2014-07-02 NOTE — Telephone Encounter (Signed)
Patient requesting Rx refill for amphetamine-dextroamphetamine (ADDERALL XR) 10 MG 24 hr capsule.  Please call when ready for pick up. °

## 2014-07-03 NOTE — Telephone Encounter (Signed)
I called the patient to let them know their Rx for Adderall was ready for pickup. Patient was instructed to bring Photo ID. 

## 2014-07-07 ENCOUNTER — Encounter: Payer: Self-pay | Admitting: Neurology

## 2014-07-28 ENCOUNTER — Telehealth: Payer: Self-pay | Admitting: *Deleted

## 2014-07-28 NOTE — Telephone Encounter (Addendum)
LMVM for pt to return call relating to coming in the am vs afternoon. ( of the 08-21-14 appt).

## 2014-08-04 NOTE — Telephone Encounter (Signed)
I spoke to pt and appt for 1000 on 08-21-14 was made as rescheduling of 08-21-14 afternoon appt.

## 2014-08-12 ENCOUNTER — Other Ambulatory Visit: Payer: Self-pay | Admitting: Adult Health

## 2014-08-12 MED ORDER — AMPHETAMINE-DEXTROAMPHET ER 10 MG PO CP24: 10.0000 mg | ORAL_CAPSULE | Freq: Every day | ORAL | Status: DC

## 2014-08-12 NOTE — Telephone Encounter (Signed)
Patient requesting Rx refill for amphetamine-dextroamphetamine (ADDERALL XR) 10 MG 24 hr capsule.  Please call when ready for pick up. °

## 2014-08-12 NOTE — Telephone Encounter (Signed)
I called the patient to let them know their Rx for Adderall XR was ready for pickup. Patient was instructed to bring Photo ID. 

## 2014-08-12 NOTE — Telephone Encounter (Signed)
Request entered, forwarded to provider for approval.  

## 2014-08-21 ENCOUNTER — Ambulatory Visit: Payer: BC Managed Care – PPO | Admitting: Neurology

## 2014-08-26 ENCOUNTER — Encounter: Payer: Self-pay | Admitting: Neurology

## 2014-09-29 ENCOUNTER — Other Ambulatory Visit: Payer: Self-pay | Admitting: Neurology

## 2014-09-29 ENCOUNTER — Telehealth: Payer: Self-pay

## 2014-09-29 MED ORDER — AMPHETAMINE-DEXTROAMPHET ER 10 MG PO CP24: 10.0000 mg | ORAL_CAPSULE | Freq: Every day | ORAL | Status: DC

## 2014-09-29 NOTE — Telephone Encounter (Signed)
Called patient to inform Rx ready for pick up at front desk. No answer. Left vmail. 

## 2014-09-29 NOTE — Telephone Encounter (Signed)
Patient requesting Rx refill for amphetamine-dextroamphetamine (ADDERALL XR) 10 MG 24 hr capsule.  Please call when ready for pick up. °

## 2014-09-29 NOTE — Telephone Encounter (Signed)
Request entered, forwarded to provider for approval.  

## 2014-11-07 ENCOUNTER — Other Ambulatory Visit: Payer: Self-pay | Admitting: *Deleted

## 2014-11-07 MED ORDER — AMPHETAMINE-DEXTROAMPHET ER 10 MG PO CP24: 10.0000 mg | ORAL_CAPSULE | Freq: Every day | ORAL | Status: DC

## 2014-11-07 NOTE — Telephone Encounter (Signed)
Last filled 08/10/15 

## 2014-11-10 ENCOUNTER — Telehealth: Payer: Self-pay

## 2014-11-10 NOTE — Telephone Encounter (Signed)
Spoke to mother. She says she has an appt with Dr. Anne HahnWillis this evening, so she will pick up patient's Rx then.

## 2014-11-18 ENCOUNTER — Other Ambulatory Visit: Payer: Self-pay | Admitting: Medical

## 2014-11-18 MED ORDER — POLYMYXIN B-TRIMETHOPRIM 10000-0.1 UNIT/ML-% OP SOLN: 1.0000 [drp] | Freq: Four times a day (QID) | OPHTHALMIC | Status: DC

## 2014-12-24 ENCOUNTER — Other Ambulatory Visit: Payer: Self-pay | Admitting: Neurology

## 2014-12-24 MED ORDER — AMPHETAMINE-DEXTROAMPHET ER 10 MG PO CP24: 10.0000 mg | ORAL_CAPSULE | Freq: Every day | ORAL | Status: DC

## 2014-12-24 NOTE — Telephone Encounter (Signed)
Dr Dohmeier and Aundra MilletMegan are out of tVickey Hugerhe office.  [facility]  Patient has appt scheduled in June

## 2014-12-24 NOTE — Telephone Encounter (Signed)
Pt is calling requesting a written Rx for amphetamine-dextroamphetamine (ADDERALL XR) 10 MG 24 hr capsule. Please call when ready for pick up.

## 2014-12-25 ENCOUNTER — Telehealth: Payer: Self-pay

## 2014-12-25 NOTE — Telephone Encounter (Signed)
Left voicemail. Rx is ready at the front desk to pick up with photo id.

## 2015-02-09 ENCOUNTER — Encounter: Payer: Self-pay | Admitting: Neurology

## 2015-02-09 ENCOUNTER — Ambulatory Visit (INDEPENDENT_AMBULATORY_CARE_PROVIDER_SITE_OTHER): Payer: BLUE CROSS/BLUE SHIELD | Admitting: Neurology

## 2015-02-09 VITALS — BP 140/82 | HR 88 | Resp 20 | Ht 61.81 in | Wt 234.0 lb

## 2015-02-09 DIAGNOSIS — G4733 Obstructive sleep apnea (adult) (pediatric): Secondary | ICD-10-CM | POA: Diagnosis not present

## 2015-02-09 DIAGNOSIS — G4710 Hypersomnia, unspecified: Secondary | ICD-10-CM

## 2015-02-09 DIAGNOSIS — Z9989 Dependence on other enabling machines and devices: Secondary | ICD-10-CM

## 2015-02-09 DIAGNOSIS — G4730 Sleep apnea, unspecified: Secondary | ICD-10-CM

## 2015-02-09 DIAGNOSIS — Z6841 Body Mass Index (BMI) 40.0 and over, adult: Secondary | ICD-10-CM | POA: Insufficient documentation

## 2015-02-09 DIAGNOSIS — G4753 Recurrent isolated sleep paralysis: Secondary | ICD-10-CM

## 2015-02-09 NOTE — Addendum Note (Signed)
Addended by: Melvyn NovasHMEIER, Metztli Sachdev on: 02/09/2015 11:27 AM   Modules accepted: Orders

## 2015-02-09 NOTE — Progress Notes (Addendum)
PATIENT: Meagan [name] DOB: 10/28/1975  REASON FOR VISIT: follow up HISTORY FROM: patient  HISTORY OF PRESENT ILLNESS: Meagan [name] is a 39 year old female with history of obstructive sleep Apnea. She returns today for a 90 day compliance download.  She brought her machine with her 02-09-15,  the patient used to machine 100% of the last 30 days and 97% of the 4 hour requirement. Averages 6 hours and 19 minutes the machine is set at 9 cm water pressure was 2 cm EPR her residual AHI is 0.9, this is an excellent result. Airleak is mild-to-moderate. She has started to change the interface more often and this seems to help with the air leakage that was reported before.  Her Epworth score remains high at 17  points was previously  16 and 19 points on CPAP ! Persistent hypersomnia. Her fatigue severity score is 60 was previously 57. Overall she feels that the CPAP has not been helpful in treating her daytime sleepiness.  She can only drive for about 20 minutes at a time due to falling asleep. She states drinking caffeine does not work.  We discuss an  MLST- she couldn't sleep  wearing the mask , I will re order the test without CPAP in daytime. She states that she has tried various medications and has the best success with Adderall XR and i will refill the medication.  Her BP remains in the 785 systolic, she is asked not to use caffeine along site the Adderall.  We we address her compliant of pressure marks from her nasal mask.  I will write for a respironics dream wear mask.     HISTORY 04/03/13 (CD): The patient had been diagnosed in 2002 by Dr. Baird Lyons. She reported taking Provigil but had no response the original diagnosis seems to have been made in 2002. She tried Ritalin after that again without a positive response started on Adderall which helped significantly but she hasn't needed increasing doses of the medication. At the time being that she was taking 40 mrem of Adderall XR divided into  doses. Problem with her insurance coverage became evident as they did not want to cover the medicines, and considered the dose of Adderall exceedingly too high for daytime use.  The patient also weaned off Zoloft in preparation for an MS LT. As the discussed at our initial visit I was suspicious that there may be an additional sleep disorder and form of obstructive sleep apnea as the patient had gained over 100 pollens in the period between her to sleep studies now. Indeed she tested positive for obstructive sleep apnea and was placed on CPAP.  The study was read on 02/18/2013 her baseline AHI was 28.4, her RDI is 36.7 in supine her AHI reached 60 and during REM 23.5. She also had frequent limb movements some of this lead to arousal and fragmentation of sleep the index here was 3.8 PLM related arousals. Since being off the Zoloft, her sleep has changed. She noticed having more emotional responses , and she likes this.  Her sleep time and pattern is not changed, only more dreams are noted.  The patient had been told by her mother that as a child she was a restless, kicking sleeper. The knee today 14 days after CPAP initiation to look at the compliance the AHI has been significantly reduced to 1.4 apneas per hour of sleep her CPAP is set at 9 cm water pressure visit centimeter EPR but her average  daily usage at this time is only 2 hours and 17 minutes. There is an up going trend noted in her user time and ankle which the patient now that she has received another month to continue using the CPAP for at least 4 hours at night.  As the her initial visit the patient endorses a high Epworth sleepiness score of that CPAP may have not had time yet to kick in. Today's Epworth score is 19 points and her fatigue severity score is 57 points the patient also has a symptom report of visit ringings sleep paralysis, intrusion of dreams, sleep hallucinations and narcolepsy Will has to be will have to be reevaluated in this  patient after 30 days of CPAP he was I feel that he can make this arrangement.today  In general the patient for an MS LT test. Again this will be based on the observation period of at least 30 days of compliant he was told that he can evaluate the CPAP is effective in reducing the daytime sleepiness or not. At this time there has been no positive effect yet.  The patient has joined weight loss programs, the Last was weight watchers, lost 10 pounds since last visit. Patient's mother and brother have sleep apnea. There is no known family member was narcolepsy. To be a great uncle by history. The patient is aware that her BMI contributes to her sleep apnea problem but the BMI is truly not a manifestation of narcolepsy itself.  Narcoleptic patients is moderate obesity may have both problems due to a lack of Hypocretin, also known as Orexin.  I also advised the patient to contact the narcolepsy net-work for further information.  I suggested that 2 and 5 Day intermittent fast as a diet to promote weight loss, a one day a week fast can be used in nondiabetic patients to maintain weight and stop gaining   REVIEW OF SYSTEMS: Full 14 system review of systems performed and notable only for:  Constitutional: fatigue Eyes: eye itching Ear/Nose/Throat: ringing in ears  Skin: N/A  Cardiovascular: N/A  Respiratory: N/A  Gastrointestinal: N/A  Genitourinary: N/A Hematology/Lymphatic: N/A  Endocrine: N/A Musculoskeletal:joint pain, joint swelling  Allergy/Immunology: N/A  Neurological: memory loss, speech difficulty Psychiatric: agitation, decreased concentration, nervous/anxious Sleep: restless leg, insomnia, apnea, frequent waking, daytime sleepiness, snoring   ALLERGIES: Allergies  Allergen Reactions  . Amoxicillin Itching and Rash    HOME MEDICATIONS: Outpatient Prescriptions Prior to Visit  Medication Sig Dispense Refill  . amphetamine-dextroamphetamine (ADDERALL XR) 10 MG 24 hr capsule Take 1  capsule (10 mg total) by mouth daily. 30 capsule 0  . desogestrel-ethinyl estradiol (KARIVA,AZURETTE,MIRCETTE) 0.15-0.02/0.01 MG (21/5) tablet Take 1 tablet by mouth daily.    . fluticasone (FLONASE) 50 MCG/ACT nasal spray Place 1 spray into both nostrils daily.    Marland Kitchen omeprazole (PRILOSEC) 20 MG capsule 1 capsule daily.    . Cholecalciferol (VITAMIN D3) 2000 UNITS TABS Take 1 tablet by mouth daily.    . meloxicam (MOBIC) 15 MG tablet 1 tablet daily.    Marland Kitchen trimethoprim-polymyxin b (POLYTRIM) ophthalmic solution Place 1 drop into both eyes every 6 (six) hours. 10 mL 0   No facility-administered medications prior to visit.    PAST MEDICAL HISTORY: Past Medical History  Diagnosis Date  . Depression   . Anxiety   . Narcolepsy   . Sleep paralysis     at times  . Vivid dream   . 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 Meanwhile  Medications have become ineffective and patient gained 100 pounds, developed OSA, now on CPAP 9 cm, MSLT to follow in 04-19-13.   . Sleep apnea     wears CPAP nightly  . Constipation, chronic   . Complication of anesthesia     post -op nausea and vomiting at age 78  . Family history of anesthesia complication     cousin woke-up during procedure    PAST SURGICAL HISTORY: Past Surgical History  Procedure Laterality Date  . Tonsillectomy and adenoidectomy  1981    age 2  . Vaginal delivery    . Cholecystectomy N/A 01/01/2014    Procedure: LAPAROSCOPIC CHOLECYSTECTOMY WITH INTRAOPERATIVE CHOLANGIOGRAM;  Surgeon: Imogene Burn. Georgette Dover, MD;  Location: [facility];  Service: General;  Laterality: N/A;    FAMILY HISTORY: Family History  Problem Relation Age of Onset  . Alcohol abuse Mother   . Depression Mother   . Anxiety disorder Mother   . Sleep apnea Mother   . Alcoholism Mother   . Alcohol abuse Father   . Alcoholism Father   . Alcohol abuse Brother   . Alcoholism Brother   . Multiple sclerosis Paternal Uncle     SOCIAL  HISTORY: History   Social History  . Marital Status: Single    Spouse Name: N/A  . Number of Children: 1  . Years of Education: some col.   Occupational History  .      [employer]   Social History Main Topics  . Smoking status: Former Smoker    Start date: 09/05/2001  . Smokeless tobacco: Never Used     Comment: 10 years ago  . Alcohol Use: Yes     Comment: occasionally,one 3-7 oz of wine per week  . Drug Use: No  . Sexual Activity: Not Currently    Birth Control/ Protection: Pill     Comment: kariva   Other Topics Concern  . Not on file   Social History Narrative   Patient is single and lives at home, her daughter lives with her.   Patient has one child.   Patient is working full-time.   Patient has some college education.   Patient is left handed.   Patient does not drink any caffeine.      PHYSICAL EXAM  Filed Vitals:   02/09/15 1032  BP: 140/82  Pulse: 88  Resp: 20  Height: 5' 1.81" (1.57 m)  Weight: 234 lb (106.142 kg)   Body mass index is 43.06 kg/(m^2).  Generalized: Well developed, in no acute distress  Neck: circumference 17 inches, Mallampati 3   Neurological examination  Mentation: Alert oriented to time, place, history taking. Follows all commands speech and language fluent Cranial nerve II-XII: Pupils were equal round reactive to light. Extraocular movements were full, visual field were full on confrontational test. Facial sensation and strength were normal.  Uvula tongue midline. Head turning and shoulder shrug  were normal and symmetric.T Motor: The motor testing reveals 5 over 5 strength of all 4 extremities. Good symmetric motor tone is noted throughout.  Sensory: Sensory testing is intact to soft touch on all 4 extremities. No evidence of extinction is noted.  Coordination: Cerebellar testing reveals good finger-nose-finger and heel-to-shin bilaterally.  Gait and station: Gait is normal.  Reflexes: Deep tendon reflexes are  symmetric and normal bilaterally.     DIAGNOSTIC DATA (LABS, IMAGING, TESTING) - I reviewed patient records, labs, notes, testing and imaging myself where available.  Lab  Results  Component Value Date   WBC 7.9 12/16/2013   HGB 13.2 12/16/2013   HCT 39.2 12/16/2013   MCV 80.5 12/16/2013   PLT 323 12/16/2013      Component Value Date/Time   NA 140 11/17/2013 0540   K 3.9 11/17/2013 0540   CL 101 11/17/2013 0540   CO2 22 11/17/2013 0540   GLUCOSE 111* 11/17/2013 0540   BUN 13 11/17/2013 0540   CREATININE 0.87 11/17/2013 0540   CALCIUM 9.7 11/17/2013 0540   PROT 7.2 11/17/2013 0540   ALBUMIN 3.5 11/17/2013 0540   AST 29 11/17/2013 0540   ALT 15 11/17/2013 0540   ALKPHOS 78 11/17/2013 0540   BILITOT 0.3 11/17/2013 0540   GFRNONAA 83* 11/17/2013 0540   GFRAA >90 11/17/2013 0540       ASSESSMENT AND PLAN 39 y.o. year old female  has a past medical history of Depression; Anxiety; Narcolepsy; Sleep paralysis; Vivid dream; Uncontrolled narcolepsy (04/03/2013); Sleep apnea; Constipation, chronic; Complication of anesthesia; and Family history of anesthesia complication. here with:  1. OSA on CPAP, 100% compliance 02-09-15  Possible narcolepsy , given her persistent hypersomnia -she had no sleep paralysis this year, vivid dreams have resolved. Order PSG on CPAP of current pressure - no titration necessary and follow with MSLT without CPAP.    Patient continues to be fatigued and sleepy throughout the day. She is still very compliant with her CPAP.  She does have some air  leaks (not nightly) but states that she really likes the mask that she has now and is not interested in changing at this time. In the future she may need to be refitted.  For now I will start her on Adderall XR 10 mg daily, this is her insurance's preferred preparation. Marland Kitchen She will let us know how if this is beneficial for her.  Otherwise she should follow-up in 1-2 months with me,  Dr. Brett Fairy.  This is after PSG  on CPAP and MSLT without CPAP, refitting for a nasal pillow. I will send the HLA test for narcolepsy off today.    02/09/2015, 10:47 AM [facility] [address] [place], [place] [address] [phone]  Note: This document was prepared with digital dictation and possible smart phrase technology. Any transcriptional errors that result from this process are unintentional.

## 2015-02-09 NOTE — Patient Instructions (Signed)
Narcolepsy Narcolepsy is a nervous system disorder that causes daytime sleepiness and sudden bouts of irresistible sleep during the day (sleep attacks). Many people with narcolepsy live with extreme daytime sleepiness for many years before being diagnosed and treated. Narcolepsy is a lifelong (chronic) disorder.  You normally go through cycles when you sleep. When your sleep becomes deeper, you have less body movement, and you start dreaming. This type of deep sleep should happen after about 90 minutes of lighter sleep. Deep sleep is called rapid eye movement (REM) sleep. When you have narcolepsy, REM is not well regulated. It can happen as soon as you fall asleep, and components of REM sleep can occur during the day when you are awake. Uncontrolled REM sleep causes symptoms of narcolepsy. CAUSES Narcolepsy may be caused by an abnormality with a chemical messenger (neurotransmitter) in your brain that controls your sleep and wake cycles. Most people with narcolepsy have low levels of the neurotransmitter hypocretin. Hypocretin is important for controlling wakefulness.  A hypocretin imbalance may be caused by abnormal genes that are passed down through families. It may also develop if the body's defense system (immune system) mistakenly attacks the brain cells that produce hypocretin (autoimmune disease). RISK FACTORS You may be at higher risk for narcolepsy if you have a family history of the disease. Other risk factors that may contribute include:  Injuries, tumors, or infections in the areas of the brain that control sleep.  Exposure to toxins.  Stress.  Hormones produced during puberty and menopause.  Poor sleep habits. SIGNS AND SYMPTOMS  Symptoms of narcolepsy can start at any age but usually begin when people are between the ages of 7 and 25 years. There are four major symptoms. Not everyone with narcolepsy will have all four.   Excessive daytime sleepiness. This is the most common symptom  and is usually the first symptom you will notice.  You may feel as if you are in a mental fog.  Daytime sleepiness may severely affect your performance at work or school.  You may fall asleep during a conversation or while eating dinner.  Sudden loss of muscle tone (cataplexy). You do not lose consciousness, but you may suddenly lose muscle control. When this occurs, your speech may become slurred, or your knees may buckle. This symptom is usually triggered by surprise, anger, or laughter.  Sleep paralysis. You may lose the ability to speak or move just as you start to fall asleep or wake up. You will be aware of the paralysis. It usually lasts for just a few seconds or minutes.  Vivid hallucinations. These may occur with sleep paralysis. The hallucinations are like having bizarre or frightening dreams while you are still awake. Other symptoms may include:   Trouble staying asleep at night (insomnia).  Restless sleep.  Feeling a strong urge to get up at night to smoke or eat. DIAGNOSIS  Your health care provider can diagnose narcolepsy based on your symptoms and the results of two diagnostic tests. You may also be asked to keep a sleep diary for several weeks. You may need the tests if you have had daytime sleepiness for at least 3 months. The two tests are:   A polysomnogram to find out how well your REM sleep is regulated at night. This test is an overnight sleep study. It measures your heart rate, breathing, movement, and brain waves.  A multiple sleep latency test (MSLT) to find out how well your REM sleep is regulated during the day. This   is a daytime sleep study. You may need to take several naps during the day. This test also measures your heart rate, breathing, movement, and brain waves. TREATMENT  There is no cure for narcolepsy, but treatment can be very effective in helping manage the condition. Treatment may include:  Lifestyle and sleeping strategies to help cope with the  condition.  Medicines. These may include:  Stimulant medicines to fight daytime sleepiness.  Antidepressant medicines to treat cataplexy.  Sodium oxybate. This is a strong sedative that you take at night. It can help daytime sleepiness and cataplexy. HOME CARE INSTRUCTIONS  Take all medicines as directed by your health care provider.  Follow these sleep practices:  Try to get about 8 hours of sleep every night.  Go to sleep and get up close to the same time every day.  Keep your bedroom dark, quiet, and comfortable.  Schedule short naps for when you feel sleepiest during the day. Tell your employer or teachers that you have narcolepsy. You may be able to adjust your schedule to include time for naps.  Try to get at least 20 minutes of exercise every day. This will help you sleep better at night and reduce daytime sleepiness.  Do not drink alcohol or caffeinated beverages within 4-5 hours of bedtime.  Do not eat a heavy meal before bedtime. Eat at about the same times every day.  Do not smoke.  Do not drive if you are sleepy or have untreated narcolepsy.  Do not swim or go out on the water without a life jacket. SEEK MEDICAL CARE IF: Your medicines are not controlling your narcolepsy symptoms. SEEK IMMEDIATE MEDICAL CARE IF:  You hurt yourself during a sleep attack or an attack of cataplexy.  You have chest pain or trouble breathing. Document Released: 08/12/2002 Document Revised: 01/06/2014 Document Reviewed: 08/14/2013 Exit[employer] Patient Information 2015 [employer]. This information is not intended to replace advice given to you by your health care provider. Make sure you discuss any questions you have with your health care provider.  

## 2015-02-17 ENCOUNTER — Telehealth: Payer: Self-pay

## 2015-02-17 NOTE — Telephone Encounter (Signed)
Spoke to pt regarding her negative HLA result. Pt verbalized understanding. She asked me to cancel her upcoming sleep tests because she no longer wants to go through with those. I contacted the sleep lab secretary and alerted her to the pt request.

## 2015-02-23 ENCOUNTER — Telehealth: Payer: Self-pay | Admitting: Neurology

## 2015-02-23 NOTE — Telephone Encounter (Signed)
Clydie Braun with [facility] called and requested to speak with the nurse regarding a question on the patients Rx. amphetamine-dextroamphetamine (ADDERALL XR) 10 MG 24 hr capsule. Please call and advise.

## 2015-02-23 NOTE — Telephone Encounter (Signed)
Meagan [name] is asking for the frequency of the adderall prescribed 02/09/15. I reviewed Dr.Dohmeier's note and it says that the pt is to take the adderall xr 10 mg daily. Relayed this information to Meagan [name].

## 2015-03-16 ENCOUNTER — Ambulatory Visit: Payer: BLUE CROSS/BLUE SHIELD | Admitting: Adult Health

## 2015-04-07 ENCOUNTER — Telehealth: Payer: Self-pay | Admitting: Neurology

## 2015-04-07 NOTE — Telephone Encounter (Signed)
I spoke to the pharmacist at [facility]. I see in Dr. Oliva Bustard note on 02/09/15 for pt that she wanted to start the pt on Adderall XR 10 mg daily. I relayed this information to the pharmacist. Pharmacist verbalized understanding.

## 2015-04-07 NOTE — Telephone Encounter (Signed)
[facility] called and states that they have an incomplete Rx of amphetamine-dextroamphetamine (ADDERALL XR) 10 MG 24 hr capsule. Does not have directions on it. Please call [phone]

## 2015-04-27 ENCOUNTER — Ambulatory Visit (INDEPENDENT_AMBULATORY_CARE_PROVIDER_SITE_OTHER): Payer: BLUE CROSS/BLUE SHIELD | Admitting: Internal Medicine

## 2015-04-27 VITALS — BP 94/58 | HR 120 | Temp 98.5°F | Resp 18 | Ht 61.5 in | Wt 238.0 lb

## 2015-04-27 DIAGNOSIS — J029 Acute pharyngitis, unspecified: Secondary | ICD-10-CM

## 2015-04-27 LAB — POCT RAPID STREP A (OFFICE): Rapid Strep A Screen: POSITIVE — AB

## 2015-04-27 MED ORDER — AZITHROMYCIN 250 MG PO TABS: ORAL_TABLET | ORAL | Status: DC

## 2015-04-27 MED ORDER — MONTELUKAST SODIUM 10 MG PO TABS: 10.0000 mg | ORAL_TABLET | Freq: Every day | ORAL | Status: DC

## 2015-04-27 NOTE — Progress Notes (Signed)
   Subjective:    Patient ID: Meagan [name], female    DOB: May 23, 1976, 39 y.o.   MRN: [id number]  HPI complaining of sore throat for 3 days. No fever. Slight cough. No runny nose. Patient Active Problem List   Diagnosis Date Noted  . Hypersomnia with sleep apnea 02/09/2015  . Severe obesity (BMI >= 40) 02/09/2015  . OSA on CPAP 02/09/2015  . ERRONEOUS ENCOUNTER--DISREGARD 05/27/2014  . Chronic cholecystitis with calculus 11/25/2013  . Uncontrolled narcolepsy 04/03/2013  . Hypersomnia, persistent 01/11/2013  . Morbid obesity with body mass index of 45.0-49.9 in adult 01/11/2013   Outpatient Encounter Prescriptions as of 04/27/2015  Medication Sig Note  . amphetamine-dextroamphetamine (ADDERALL XR) 10 MG 24 hr capsule Take 1 capsule (10 mg total) by mouth daily.   Marland Kitchen desogestrel-ethinyl estradiol (KARIVA,AZURETTE,MIRCETTE) 0.15-0.02/0.01 MG (21/5) tablet Take 1 tablet by mouth daily.   . DiphenhydrAMINE HCl (BENADRYL ALLERGY PO) Take by mouth.   . fluticasone (FLONASE) 50 MCG/ACT nasal spray Place 1 spray into both nostrils daily. 05/19/2014: Received from: External Pharmacy Received Sig:   . omeprazole (PRILOSEC) 20 MG capsule 1 capsule daily. 05/19/2014: Received from: External Pharmacy Received Sig:   . azithromycin (ZITHROMAX) 250 MG tablet As packaged   . montelukast (SINGULAIR) 10 MG tablet Take 1 tablet (10 mg total) by mouth at bedtime.    No facility-administered encounter medications on file as of 04/27/2015.   she also has concern about persistent allergies due to mostly bedroom area is on Flonase without control. She uses an histamines intermittently. There is no history of asthma.   haley just went to college Review of Systems Noncontributory    Objective:   Physical Exam BP 94/58 mmHg  Pulse 120  Temp(Src) 98.5 F (36.9 C) (Oral)  Resp 18  Ht 5' 1.5" (1.562 m)  Wt 238 lb (107.956 kg)  BMI 44.25 kg/m2  SpO2 98% TMs clear Conjunctiva clear Nares clear Throat red  with scant exudate Tender anterior cervical nodes Lungs clear  Rapid strep positive       Assessment & Plan:  Sore throat - Plan: POCT rapid strep A  streptococcal pharyngitis Allergic rhinitis  Meds ordered this encounter  Medications  . montelukast (SINGULAIR) 10 MG tablet    Sig: Take 1 tablet (10 mg total) by mouth at bedtime.    Dispense:  30 tablet    Refill:  5  . azithromycin (ZITHROMAX) 250 MG tablet    Sig: As packaged    Dispense:  6 tablet    Refill:  0    -   use Zyrtec or Allegra on a daily basis

## 2015-11-26 ENCOUNTER — Other Ambulatory Visit: Payer: Self-pay | Admitting: Internal Medicine

## 2015-11-29 NOTE — Telephone Encounter (Signed)
LM patient will need to contact primary care for refills.  Computer says Sigmund HazelLisa Miller please confirm

## 2016-01-01 ENCOUNTER — Telehealth: Payer: Self-pay | Admitting: Behavioral Health

## 2016-01-01 NOTE — Telephone Encounter (Signed)
Unable to reach patient at time of Pre-Visit Call.  Left message for patient to return call when available.    

## 2016-01-04 ENCOUNTER — Encounter: Payer: Self-pay | Admitting: Family Medicine

## 2016-01-04 ENCOUNTER — Ambulatory Visit (INDEPENDENT_AMBULATORY_CARE_PROVIDER_SITE_OTHER): Payer: BLUE CROSS/BLUE SHIELD | Admitting: Family Medicine

## 2016-01-04 VITALS — BP 124/86 | HR 96 | Temp 98.6°F | Ht 61.0 in | Wt 245.0 lb

## 2016-01-04 DIAGNOSIS — J3089 Other allergic rhinitis: Secondary | ICD-10-CM

## 2016-01-04 DIAGNOSIS — L299 Pruritus, unspecified: Secondary | ICD-10-CM | POA: Diagnosis not present

## 2016-01-04 DIAGNOSIS — K219 Gastro-esophageal reflux disease without esophagitis: Secondary | ICD-10-CM

## 2016-01-04 MED ORDER — HYDROCORTISONE-ACETIC ACID 1-2 % OT SOLN: 3.0000 [drp] | Freq: Three times a day (TID) | OTIC | Status: DC

## 2016-01-04 MED ORDER — FLUTICASONE PROPIONATE 50 MCG/ACT NA SUSP: 1.0000 | Freq: Every day | NASAL | Status: DC

## 2016-01-04 MED ORDER — OMEPRAZOLE 20 MG PO CPDR: 20.0000 mg | DELAYED_RELEASE_CAPSULE | Freq: Every day | ORAL | Status: DC

## 2016-01-04 NOTE — Progress Notes (Signed)
Pre visit review using our clinic review tool, if applicable. No additional management support is needed unless otherwise documented below in the visit note. 

## 2016-01-04 NOTE — Progress Notes (Signed)
[name] [place] [address], [address] [place], [place] [address] [phone] [phone]  Date:  01/04/2016   Name:  Meagan [name]   DOB:  08-21-1976   MRN:  [id number]  PCP:  Abbe AmsterdamOPLAND,Meagan Dalpe, MD    Chief Complaint: Establish Care   History of Present Illness:  Meagan [name] is a 40 y.o. very pleasant female patient who presents with the following:  Here today to establish care.  She has a history of GERD, obesity, OSA, allergies- she is on singulair.  She has a mold issue at home, the singulair does seem to help  She had her gallbladder out 2 years ago and has needed prilosec since then.  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 Her daughter is doing ok- she is at college.   She is working on weight loss- this is a challenge for her.  Notes that when she is feeling depressed she does not care about her weight and will gain  Recent labs looked ok per her report- done in October.   She does have borderline vitamin D and takes a supplement, also a B vitamin She would like to try tumeric.    She does have a history of depression- she is not depressed currently however. She has tried 'every antidepressant" and has not found anything that seemed to work for her No SI She also notes a history of chronic left ear itching- she wonders if there is anything she can use for this Patient Active Problem List   Diagnosis Date Noted  . Hypersomnia with sleep apnea 02/09/2015  . Severe obesity (BMI >= 40) (HCC) 02/09/2015  . OSA on CPAP 02/09/2015  . ERRONEOUS ENCOUNTER--DISREGARD 05/27/2014  . Chronic cholecystitis with calculus 11/25/2013  . Uncontrolled narcolepsy 04/03/2013  . Hypersomnia, persistent 01/11/2013  . Morbid obesity with body mass index of 45.0-49.9 in  adult [facility](HCC) 01/11/2013    Past Medical History  Diagnosis Date  . Depression   . Anxiety   . Narcolepsy   . Sleep paralysis     at times  . Vivid dream   . 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 Meanwhile  Medications have become ineffective and patient gained 100 pounds, developed OSA, now on CPAP 9 cm, MSLT to follow in 04-19-13.   . Sleep apnea     wears CPAP nightly  . Constipation, chronic   . Complication of anesthesia     post -op nausea and vomiting at age 89  . Family history of anesthesia complication     cousin woke-up during procedure  . Ruptured lumbar disc     L5  . GERD (gastroesophageal reflux disease)   . Allergy     Seasonal    Past Surgical History  Procedure Laterality Date  . Tonsillectomy and adenoidectomy  1981    age 89  . Vaginal delivery    . Cholecystectomy N/A 01/01/2014    Procedure: LAPAROSCOPIC CHOLECYSTECTOMY WITH INTRAOPERATIVE CHOLANGIOGRAM;  Surgeon: Wilmon ArmsMatthew K. Corliss Skainssuei, MD;  Location: [facility] OR;  Service: General;  Laterality: N/A;    Social History  Substance Use Topics  . Smoking status: Former Smoker    Start date: 09/05/2001  .  Smokeless tobacco: Never Used     Comment: 10 years ago  . Alcohol Use: Yes     Comment: occasionally,one 3-7 oz of wine per week    Family History  Problem Relation Age of Onset  . Alcohol abuse Mother   . Depression Mother   . Anxiety disorder Mother   . Sleep apnea Mother   . Alcoholism Mother   . Diabetes Mother   . Alcohol abuse Father   . Alcoholism Father   . Alcohol abuse Brother   . Alcoholism Brother   . Multiple sclerosis Paternal Uncle     Allergies  Allergen Reactions  . Amoxicillin Itching and Rash    Medication list has been reviewed and updated.  Current Outpatient Prescriptions on File Prior to Visit  Medication Sig Dispense Refill  . desogestrel-ethinyl estradiol (KARIVA,AZURETTE,MIRCETTE) 0.15-0.02/0.01 MG (21/5) tablet Take 1 tablet by  mouth daily.    . DiphenhydrAMINE HCl (BENADRYL ALLERGY PO) Take by mouth.    . fluticasone (FLONASE) 50 MCG/ACT nasal spray Place 1 spray into both nostrils daily.    . montelukast (SINGULAIR) 10 MG tablet TAKE 1 TABLET (10 MG TOTAL) BY MOUTH AT BEDTIME. 90 tablet 1  . omeprazole (PRILOSEC) 20 MG capsule 1 capsule daily.     No current facility-administered medications on file prior to visit.    Review of Systems:  As per HPI- otherwise negative.  LMP started today   Physical Examination: Filed Vitals:   01/04/16 1612  BP: 124/86  Pulse: 96  Temp: 98.6 F (37 C)   Body mass index is 46.32 kg/(m^2).  GEN: WDWN, NAD, Non-toxic, A & O x 3, obese, looks well HEENT: Atraumatic, Normocephalic. Neck supple. No masses, No LAD.  Bilateral TM wnl, oropharynx normal.  PEERL,EOMI.   Ears and Nose: No external deformity. CV: RRR, No M/G/R. No JVD. No thrill. No extra heart sounds. PULM: CTA B, no wheezes, crackles, rhonchi. No retractions. No resp. distress. No accessory muscle use. ABD: S, NT, ND, +BS. No rebound. No HSM. EXTR: No c/c/e NEURO Normal gait.  PSYCH: Normally interactive. Conversant. Not depressed or anxious appearing.  Calm demeanor.    Assessment and Plan: Gastroesophageal reflux disease without esophagitis - Plan: omeprazole (PRILOSEC) 20 MG capsule  Other allergic rhinitis - Plan: fluticasone (FLONASE) 50 MCG/ACT nasal spray  Ear itching - Plan: acetic acid-hydrocortisone (VOSOL-HC) otic solution  Refilled her prilosed and flonase Continue singulair Ear is itchy- no sign of infection however Will try acetic acid/ hydrocortisone as needed for a brief time  Signed Abbe Amsterdam, MD

## 2016-01-04 NOTE — Patient Instructions (Addendum)
Continue to take your prilosec and singulair as directed.  If you like we could try a medication called Belviq to work on weight loss Try the ear drop for itching.  It if does not seem to help after a week or so stop using it.

## 2016-06-14 ENCOUNTER — Other Ambulatory Visit: Payer: Self-pay | Admitting: Emergency Medicine

## 2016-06-14 MED ORDER — MONTELUKAST SODIUM 10 MG PO TABS: ORAL_TABLET | ORAL | 1 refills | Status: DC

## 2016-07-09 ENCOUNTER — Telehealth: Payer: Self-pay

## 2016-07-09 ENCOUNTER — Ambulatory Visit (INDEPENDENT_AMBULATORY_CARE_PROVIDER_SITE_OTHER): Payer: BLUE CROSS/BLUE SHIELD | Admitting: Family Medicine

## 2016-07-09 VITALS — BP 110/74 | HR 101 | Temp 98.2°F | Resp 18 | Ht 61.0 in | Wt 220.0 lb

## 2016-07-09 DIAGNOSIS — L0231 Cutaneous abscess of buttock: Secondary | ICD-10-CM

## 2016-07-09 MED ORDER — SULFAMETHOXAZOLE-TRIMETHOPRIM 800-160 MG PO TABS: 1.0000 | ORAL_TABLET | Freq: Two times a day (BID) | ORAL | 0 refills | Status: DC

## 2016-07-09 MED ORDER — MUPIROCIN 2 % EX OINT: 1.0000 "application " | TOPICAL_OINTMENT | Freq: Three times a day (TID) | CUTANEOUS | 1 refills | Status: DC

## 2016-07-09 NOTE — Patient Instructions (Addendum)
IF you received an x-ray today, you will receive an invoice from [facility]. Please contact [facility] at [phone] with questions or concerns regarding your invoice.   IF you received labwork today, you will receive an invoice from [facility]. Please contact [facility] at [phone] with questions or concerns regarding your invoice.   Our billing staff will not be able to assist you with questions regarding bills from these companies.  You will be contacted with the lab results as soon as they are available. The fastest way to get your results is to activate your My Chart account. Instructions are located on the last page of this paperwork. If you have not heard from us regarding the results in 2 weeks, please contact this office.    Incision and Drainage Incision and drainage is a procedure in which a sac-like structure (cystic structure) is opened and drained. The area to be drained usually contains material such as pus, fluid, or blood.  LET YOUR CAREGIVER KNOW ABOUT:   Allergies to medicine.  Medicines taken, including vitamins, herbs, eyedrops, over-the-counter medicines, and creams.  Use of steroids (by mouth or creams).  Previous problems with anesthetics or numbing medicines.  History of bleeding problems or blood clots.  Previous surgery.  Other health problems, including diabetes and kidney problems.  Possibility of pregnancy, if this applies. RISKS AND COMPLICATIONS  Pain.  Bleeding.  Scarring.  Infection. BEFORE THE PROCEDURE  You may need to have an ultrasound or other imaging tests to see how large or deep your cystic structure is. Blood tests may also be used to determine if you have an infection or how severe the infection is. You may need to have a tetanus shot. PROCEDURE  The affected area is cleaned with a cleaning fluid. The cyst area will then be numbed with a medicine (local anesthetic). A small  incision will be made in the cystic structure. A syringe or catheter may be used to drain the contents of the cystic structure, or the contents may be squeezed out. The area will then be flushed with a cleansing solution. After cleansing the area, it is often gently packed with a gauze or another wound dressing. Once it is packed, it will be covered with gauze and tape or some other type of wound dressing. AFTER THE PROCEDURE   Often, you will be allowed to go home right after the procedure.  You may be given antibiotic medicine to prevent or heal an infection.  If the area was packed with gauze or some other wound dressing, you will likely need to come back in 1 to 2 days to get it removed.  The area should heal in about 14 days.   This information is not intended to replace advice given to you by your health care provider. Make sure you discuss any questions you have with your health care provider.   Document Released: 02/15/2001 Document Revised: 02/21/2012 Document Reviewed: 10/17/2011 Elsevier Interactive Patient Education 2016 Elsevier Inc. Incision and Drainage, Care After Refer to this sheet in the next few weeks. These instructions provide you with information on caring for yourself after your procedure. Your caregiver may also give you more specific instructions. Your treatment has been planned according to current medical practices, but problems sometimes occur. Call your caregiver if you have any problems or questions after your procedure. HOME CARE INSTRUCTIONS   If antibiotic medicine is given, take it as directed. Finish it even if you start  to feel better.  Only take over-the-counter or prescription medicines for pain, discomfort, or fever as directed by your caregiver.  Keep all follow-up appointments as directed by your caregiver.  Change any bandages (dressings) as directed by your caregiver. Replace old dressings with clean dressings.  Wash your hands before and after  caring for your wound. You will receive specific instructions for cleansing and caring for your wound.  SEEK MEDICAL CARE IF:   You have increased pain, swelling, or redness around the wound.  You have increased drainage, smell, or bleeding from the wound.  You have muscle aches, chills, or you feel generally sick.  You have a fever. MAKE SURE YOU:   Understand these instructions.  Will watch your condition.  Will get help right away if you are not doing well or get worse.   This information is not intended to replace advice given to you by your health care provider. Make sure you discuss any questions you have with your health care provider.   Document Released: 11/14/2011 Document Revised: 09/12/2014 Document Reviewed: 11/14/2011 Elsevier Interactive Patient Education 2016 Elsevier Inc.  Abscess An abscess is an infected area that contains a collection of pus and debris.It can occur in almost any part of the body. An abscess is also known as a furuncle or boil. CAUSES  An abscess occurs when tissue gets infected. This can occur from blockage of oil or sweat glands, infection of hair follicles, or a minor injury to the skin. As the body tries to fight the infection, pus collects in the area and creates pressure under the skin. This pressure causes pain. People with weakened immune systems have difficulty fighting infections and get certain abscesses more often.  SYMPTOMS Usually an abscess develops on the skin and becomes a painful mass that is red, warm, and tender. If the abscess forms under the skin, you may feel a moveable soft area under the skin. Some abscesses break open (rupture) on their own, but most will continue to get worse without care. The infection can spread deeper into the body and eventually into the bloodstream, causing you to feel ill.  DIAGNOSIS  Your caregiver will take your medical history and perform a physical exam. A sample of fluid may also be taken from  the abscess to determine what is causing your infection. TREATMENT  Your caregiver may prescribe antibiotic medicines to fight the infection. However, taking antibiotics alone usually does not cure an abscess. Your caregiver may need to make a small cut (incision) in the abscess to drain the pus. In some cases, gauze is packed into the abscess to reduce pain and to continue draining the area. HOME CARE INSTRUCTIONS   Only take over-the-counter or prescription medicines for pain, discomfort, or fever as directed by your caregiver.  If you were prescribed antibiotics, take them as directed. Finish them even if you start to feel better.  If gauze is used, follow your caregiver's directions for changing the gauze.  To avoid spreading the infection:  Keep your draining abscess covered with a bandage.  Wash your hands well.  Do not share personal care items, towels, or whirlpools with others.  Avoid skin contact with others.  Keep your skin and clothes clean around the abscess.  Keep all follow-up appointments as directed by your caregiver. SEEK MEDICAL CARE IF:   You have increased pain, swelling, redness, fluid drainage, or bleeding.  You have muscle aches, chills, or a general ill feeling.  You have a fever.  MAKE SURE YOU:   Understand these instructions.  Will watch your condition.  Will get help right away if you are not doing well or get worse.   This information is not intended to replace advice given to you by your health care provider. Make sure you discuss any questions you have with your health care provider.   Document Released: 06/01/2005 Document Revised: 02/21/2012 Document Reviewed: 11/04/2011 Elsevier Interactive Patient Education [employer].

## 2016-07-09 NOTE — Progress Notes (Signed)
Subjective:  By signing my name below, I, Raven Small, attest that this documentation has been prepared under the direction and in the presence of Norberto SorensonEva Shaw, MD.  Electronically Signed: Andrew Auaven Small, ED Scribe. 07/09/2016. 8:48 AM.   Patient ID: Meagan [name], female    DOB: 1976/01/12, 40 y.o.   MRN: [id number]  HPI Chief Complaint  Patient presents with  . Cyst    BUTT AREA    HPI Comments: Meagan [name] is a 40 y.o. female who presents to the [facility] complaining of abscess on buttock noticed 1 week ago. Pt noticed abscess while showering 1 week ago. Abscess is not painful, but described as sensitive. Pt denies hx of abscess. she denies recent abx use. Pt reports constipation, but this is normal for her. She denies fever, chills, abdominal pain, nausea, emesis, drainage, urinary symptoms. Pt is allergies to amoxicillin and reports intolerance to doxycycline; states medication makes her sick on her stomach.   Patient Active Problem List   Diagnosis Date Noted  . Hypersomnia with sleep apnea 02/09/2015  . Severe obesity (BMI >= 40) (HCC) 02/09/2015  . OSA on CPAP 02/09/2015  . ERRONEOUS ENCOUNTER--DISREGARD 05/27/2014  . Chronic cholecystitis with calculus 11/25/2013  . Uncontrolled narcolepsy 04/03/2013  . Hypersomnia, persistent 01/11/2013  . Morbid obesity with body mass index of 45.0-49.9 in adult [facility](HCC) 01/11/2013   Past Medical History:  Diagnosis Date  . Allergy    Seasonal  . Anxiety   . Complication of anesthesia    post -op nausea and vomiting at age 89  . Constipation, chronic   . Depression   . Family history of anesthesia complication    cousin woke-up during procedure  . GERD (gastroesophageal reflux disease)   . Narcolepsy   . Ruptured lumbar disc    L5  . Sleep apnea    wears CPAP nightly  . Sleep paralysis    at times  . Uncontrolled narcolepsy 04/03/2013   Diagnosed 2002 , by Dr Jetty Duhamellinton Young. Meanwhile  Medications have become  ineffective and patient gained 100 pounds, developed OSA, now on CPAP 9 cm, MSLT to follow in 04-19-13.   . Vivid dream    Past Surgical History:  Procedure Laterality Date  . CHOLECYSTECTOMY N/A 01/01/2014   Procedure: LAPAROSCOPIC CHOLECYSTECTOMY WITH INTRAOPERATIVE CHOLANGIOGRAM;  Surgeon: Wilmon ArmsMatthew K. Corliss Skainssuei, MD;  Location: [facility] OR;  Service: General;  Laterality: N/A;  . TONSILLECTOMY AND ADENOIDECTOMY  1981   age 89  . VAGINAL DELIVERY     Allergies  Allergen Reactions  . Amoxicillin Itching and Rash   Prior to Admission medications   Medication Sig Start Date End Date Taking? Authorizing Provider  desogestrel-ethinyl estradiol (KARIVA,AZURETTE,MIRCETTE) 0.15-0.02/0.01 MG (21/5) tablet Take 1 tablet by mouth daily.   Yes Historical Provider, MD  meloxicam (MOBIC) 15 MG tablet Take 1 tablet by mouth daily. 11/26/15  Yes Historical Provider, MD  montelukast (SINGULAIR) 10 MG tablet TAKE 1 TABLET (10 MG TOTAL) BY MOUTH AT BEDTIME. 06/14/16  Yes Jessica C Copland, MD  omeprazole (PRILOSEC) 20 MG capsule Take 1 capsule (20 mg total) by mouth daily. 01/04/16  Yes Pearline CablesJessica C Copland, MD   Social History   Social History  . Marital status: Single    Spouse name: N/A  . Number of children: 1  . Years of education: some col.   Occupational History  .  [employer]   Social History Main Topics  .  Smoking status: Former Smoker    Start date: 09/05/2001  . Smokeless tobacco: Never Used     Comment: 10 years ago  . Alcohol use Yes     Comment: occasionally,one 3-7 oz of wine per week  . Drug use: No  . Sexual activity: Not Currently    Birth control/ protection: Pill     Comment: kariva   Other Topics Concern  . Not on file   Social History Narrative   Patient is single and lives at home, her daughter lives with her.   Patient has one child.   Patient is working full-time.   Patient has some college education.   Patient is left handed.   Patient does  not drink any caffeine.   Review of Systems  Constitutional: Negative for chills, diaphoresis and fever.  Gastrointestinal: Positive for constipation. Negative for abdominal pain, diarrhea, nausea and vomiting.  Genitourinary: Negative for difficulty urinating, dysuria, frequency and urgency.     Objective:   Physical Exam  Constitutional: She is oriented to person, place, and time. She appears well-developed and well-nourished. No distress.  HENT:  Head: Normocephalic and atraumatic.  Eyes: Conjunctivae are normal.  Neck: Neck supple.  Cardiovascular: Normal rate.   Pulmonary/Chest: Effort normal.  Musculoskeletal: Normal range of motion.  Neurological: She is alert and oriented to person, place, and time.  Skin: Skin is warm and dry.  Approximately 1 cm soft boil around 9 o clock of the skin surrounding the anus.   Psychiatric: She has a normal mood and affect. Her behavior is normal.  Nursing note and vitals reviewed.   Risks/benefits reviewed and informed consent obtained. Cleaned with EtOH.  Anesthesia with approx 2 cc of subcutaneous 1% lidocaine with epi with good result.  1/2 cm incision made into center of abscess w/ 11 blade and expressed sanguinous drainage. Abscess explored with hemostats and no loculati1/2ns found. Packed with approx 1/2 in of iodoform 1/4cm packing gauze and dressed.  Pt tolerated procedure well.   Vitals:   07/09/16 0836  BP: 110/74  Pulse: (!) 101  Resp: 18  Temp: 98.2 F (36.8 C)  TempSrc: Oral  SpO2: 98%  Weight: 220 lb (99.8 kg)  Height: 5\' 1"  (1.549 m)   Assessment & Plan:   1. Abscess of gluteal region     Orders Placed This Encounter  Procedures  . WOUND CULTURE    Order Specific Question:   Source    Answer:   buttock 9 oclock    Meds ordered this encounter  Medications  . sulfamethoxazole-trimethoprim (BACTRIM DS,SEPTRA DS) 800-160 MG tablet    Sig: Take 1 tablet by mouth 2 (two) times daily.    Dispense:  20 tablet     Refill:  0  . mupirocin ointment (BACTROBAN) 2 %    Sig: Apply 1 application topically 3 (three) times daily.    Dispense:  22 g    Refill:  1     I personally performed the services described in this documentation, which was scribed in my presence. The recorded information has been reviewed and considered, and addended by me as needed.   Norberto SorensonEva Shaw, M.D.  [facility]  [place] [address] [place], [place] [address] [phone] phone [phone] fax  07/19/16 10:56 PM

## 2016-07-09 NOTE — Telephone Encounter (Signed)
Pt. Got home from appointment and packing and dressing came out. It is draining I offered her to return but patient would like to just manage with gauze at home at this point. She states she would come back if excessive drng. or pain or signs of infection.

## 2016-07-11 LAB — WOUND CULTURE
GRAM STAIN: NONE SEEN
Organism ID, Bacteria: NO GROWTH

## 2016-07-11 NOTE — Telephone Encounter (Signed)
Perfect, thanks 

## 2016-09-02 ENCOUNTER — Ambulatory Visit (INDEPENDENT_AMBULATORY_CARE_PROVIDER_SITE_OTHER): Payer: BLUE CROSS/BLUE SHIELD | Admitting: Family Medicine

## 2016-09-02 VITALS — BP 130/80 | HR 88 | Temp 97.9°F | Resp 16 | Ht 61.0 in | Wt 217.0 lb

## 2016-09-02 DIAGNOSIS — K219 Gastro-esophageal reflux disease without esophagitis: Secondary | ICD-10-CM

## 2016-09-02 DIAGNOSIS — F411 Generalized anxiety disorder: Secondary | ICD-10-CM

## 2016-09-02 DIAGNOSIS — E039 Hypothyroidism, unspecified: Secondary | ICD-10-CM | POA: Diagnosis not present

## 2016-09-02 MED ORDER — HYDROXYZINE HCL 25 MG PO TABS: 25.0000 mg | ORAL_TABLET | Freq: Three times a day (TID) | ORAL | 0 refills | Status: DC | PRN

## 2016-09-02 MED ORDER — PROPRANOLOL HCL ER 80 MG PO CP24: 80.0000 mg | ORAL_CAPSULE | Freq: Every day | ORAL | 0 refills | Status: DC

## 2016-09-02 NOTE — Progress Notes (Signed)
By signing my name below, I, Meagan [name], attest that this documentation has been prepared under the direction and in the presence of Norberto Sorenson, MD.  Electronically Signed: Arvilla Market, Medical Scribe. 09/02/16. 2:49 PM.  Subjective:    Patient ID: Meagan [name], female    DOB: 05-Jun-1976, 40 y.o.   MRN: [id number]  HPI Chief Complaint  Patient presents with  . Medication Refill    discuss anxiety issues and medications she is currently taking     HPI Comments: Meagan [name] is a 40 y.o. female who presents to the [facility] for medication refill. She used to take prevacid when she woke up in the morning, but after being dx with hypothyroidism she now takes it at night. Since switching her regimen, she wakes up to a voice change that resolves itself, sore throat, yellow tongue, and feeling "ulcers" in the back her throat (she can't see it). She notes she didn't have a problem with GERD until her gallbladder was taking out. Denies incresed bad breath, and trouble swallowing.  Anxiety: Reports chest pain and isn't sure if it's due to her anxiety. She just started levothyroxine 6 weeks ago and her anxiety started going "through the roof" quickly after to almost getting to the point of panic attacks. She's worries about having one causing her to focus on not having one keeping her in flight or fight mode. She's getting to the point where she doesn't want to get out of the house since she's thinking of all of the terrible scenarios that could happen. Reports associated sxs of palpitations, constipation, sleep disturbance, and "double the amount" of hair loss. She also has been waking up at night thinking about her bad dreams, and she could think of something and breakdown She took different doses of zoloft in the past and it made her "blah"- she no longer takes this medication since she had to wean herself off of it. She also took effexor, and wellbutrin in the past which she  eventually discontinued. She states her baseline is forgetful and cloudy. Deneis diaphoresis, and chills.  Pt would like to establish care with  Dr. Clelia Croft.  Patient Active Problem List   Diagnosis Date Noted  . Hypersomnia with sleep apnea 02/09/2015  . Severe obesity (BMI >= 40) (HCC) 02/09/2015  . OSA on CPAP 02/09/2015  . ERRONEOUS ENCOUNTER--DISREGARD 05/27/2014  . Chronic cholecystitis with calculus 11/25/2013  . Uncontrolled narcolepsy 04/03/2013  . Hypersomnia, persistent 01/11/2013  . Morbid obesity with body mass index of 45.0-49.9 in adult [facility]) 01/11/2013   Past Medical History:  Diagnosis Date  . Allergy    Seasonal  . Anxiety   . Complication of anesthesia    post -op nausea and vomiting at age 60  . Constipation, chronic   . Depression   . Family history of anesthesia complication    cousin woke-up during procedure  . GERD (gastroesophageal reflux disease)   . Narcolepsy   . Ruptured lumbar disc    L5  . Sleep apnea    wears CPAP nightly  . Sleep paralysis    at times  . 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 Meanwhile  Medications have become ineffective and patient gained 100 pounds, developed OSA, now on CPAP 9 cm, MSLT to follow in 04-19-13.   . Vivid dream    Past Surgical History:  Procedure Laterality Date  . CHOLECYSTECTOMY N/A 01/01/2014   Procedure: LAPAROSCOPIC  CHOLECYSTECTOMY WITH INTRAOPERATIVE CHOLANGIOGRAM;  Surgeon: Wilmon ArmsMatthew K. Corliss Skainssuei, MD;  Location: [facility] OR;  Service: General;  Laterality: N/A;  . TONSILLECTOMY AND ADENOIDECTOMY  1981   age 89  . VAGINAL DELIVERY     Allergies  Allergen Reactions  . Amoxicillin Itching and Rash   Prior to Admission medications   Medication Sig Start Date End Date Taking? Authorizing Provider  desogestrel-ethinyl estradiol (KARIVA,AZURETTE,MIRCETTE) 0.15-0.02/0.01 MG (21/5) tablet Take 1 tablet by mouth daily.   Yes Historical Provider, MD  meloxicam (MOBIC) 15 MG tablet Take  1 tablet by mouth daily. 11/26/15  Yes Historical Provider, MD  montelukast (SINGULAIR) 10 MG tablet TAKE 1 TABLET (10 MG TOTAL) BY MOUTH AT BEDTIME. 06/14/16  Yes Jessica C Copland, MD  omeprazole (PRILOSEC) 20 MG capsule Take 1 capsule (20 mg total) by mouth daily. 01/04/16  Yes Gwenlyn FoundJessica C Copland, MD  mupirocin ointment (BACTROBAN) 2 % Apply 1 application topically 3 (three) times daily. Patient not taking: Reported on 09/02/2016 07/09/16   Sherren MochaEva N Shaw, MD  sulfamethoxazole-trimethoprim (BACTRIM DS,SEPTRA DS) 800-160 MG tablet Take 1 tablet by mouth 2 (two) times daily. Patient not taking: Reported on 09/02/2016 07/09/16   Sherren MochaEva N Shaw, MD   Social History   Social History  . Marital status: Single    Spouse name: N/A  . Number of children: 1  . Years of education: some col.   Occupational History  .  [employer]   Social History Main Topics  . Smoking status: Former Smoker    Start date: 09/05/2001  . Smokeless tobacco: Never Used     Comment: 10 years ago  . Alcohol use Yes     Comment: occasionally,one 3-7 oz of wine per week  . Drug use: No  . Sexual activity: Not Currently    Birth control/ protection: Pill     Comment: kariva   Other Topics Concern  . Not on file   Social History Narrative   Patient is single and lives at home, her daughter lives with her.   Patient has one child.   Patient is working full-time.   Patient has some college education.   Patient is left handed.   Patient does not drink any caffeine.   Review of Systems  Constitutional: Negative for chills and diaphoresis.  HENT: Positive for mouth sores and voice change. Negative for trouble swallowing.   Cardiovascular: Positive for chest pain and palpitations.  Gastrointestinal: Positive for constipation.  Psychiatric/Behavioral: Positive for sleep disturbance. The patient is nervous/anxious.    Objective:  Physical Exam  Constitutional: She appears well-developed and  well-nourished. No distress.  HENT:  Head: Normocephalic and atraumatic.  Eyes: Conjunctivae are normal.  Neck: Neck supple.  Cardiovascular: Normal rate, regular rhythm and normal heart sounds.  Exam reveals no friction rub.   No murmur heard. Pulmonary/Chest: Effort normal and breath sounds normal. No respiratory distress. She has no wheezes. She has no rales.  Neurological: She is alert.  Skin: Skin is warm and dry.  Psychiatric: She has a normal mood and affect. Her behavior is normal.  Nursing note and vitals reviewed.  BP 130/80 (BP Location: Right Arm, Patient Position: Sitting, Cuff Size: Large)   Pulse 88   Temp 97.9 F (36.6 C) (Oral)   Resp 16   Ht 5\' 1"  (1.549 m)   Wt 217 lb (98.4 kg)   LMP 08/19/2016   SpO2 97%   BMI 41.00 kg/m  Assessment &  Plan:   1. Gastroesophageal reflux disease, esophagitis presence not specified - noticed decrease effectiveness of ppi since needing to change administration from qam since started levothyroxine.  Rec taking on an empty stomach and try elev head of bed slightly with bricks. Do not eat within 3 hrs of bed. If sxs persist, could increase omeprazole from 20 to 40 or try changing to other ppi.  2. Hypothyroidism, unspecified type - followed by Dr. Talmage NapBalan - new onset and hoping that dose is to high since she is now having new onset anxiety sxs  3. Anxiety state - likely due to newly starting levothyroxine from hypothyroidism.  Try below to help with sxs while dose is adjusted. If sxs persist, RTC for further eval.    Meds ordered this encounter  Medications  . propranolol ER (INDERAL LA) 80 MG 24 hr capsule    Sig: Take 1 capsule (80 mg total) by mouth daily.    Dispense:  30 capsule    Refill:  0  . hydrOXYzine (ATARAX/VISTARIL) 25 MG tablet    Sig: Take 1 tablet (25 mg total) by mouth 3 (three) times daily as needed.    Dispense:  30 tablet    Refill:  0    I personally performed the services described in this documentation,  which was scribed in my presence. The recorded information has been reviewed and considered, and addended by me as needed.   Norberto SorensonEva Shaw, M.D.  [facility]  [place] [address] [place], [place] [address] [phone] phone [phone]) [phone] fax  09/05/16 7:27 PM

## 2016-09-02 NOTE — Patient Instructions (Addendum)
   IF you received an x-ray today, you will receive an invoice from [facility]. Please contact [facility] at [phone] with questions or concerns regarding your invoice.   IF you received labwork today, you will receive an invoice from [facility]. Please contact [facility] at [phone] with questions or concerns regarding your invoice.   Our billing staff will not be able to assist you with questions regarding bills from these companies.  You will be contacted with the lab results as soon as they are available. The fastest way to get your results is to activate your My Chart account. Instructions are located on the last page of this paperwork. If you have not heard from us regarding the results in 2 weeks, please contact this office.     Generalized Anxiety Disorder Generalized anxiety disorder (GAD) is a mental disorder. It interferes with life functions, including relationships, work, and school. GAD is different from normal anxiety, which everyone experiences at some point in their lives in response to specific life events and activities. Normal anxiety actually helps us prepare for and get through these life events and activities. Normal anxiety goes away after the event or activity is over.  GAD causes anxiety that is not necessarily related to specific events or activities. It also causes excess anxiety in proportion to specific events or activities. The anxiety associated with GAD is also difficult to control. GAD can vary from mild to severe. People with severe GAD can have intense waves of anxiety with physical symptoms (panic attacks).  SYMPTOMS The anxiety and worry associated with GAD are difficult to control. This anxiety and worry are related to many life events and activities and also occur more days than not for 6 months or longer. People with GAD also have three or more of the following symptoms (one or more in children):  Restlessness.    Fatigue.  Difficulty concentrating.   Irritability.  Muscle tension.  Difficulty sleeping or unsatisfying sleep. DIAGNOSIS GAD is diagnosed through an assessment by your health care provider. Your health care provider will ask you questions aboutyour mood,physical symptoms, and events in your life. Your health care provider may ask you about your medical history and use of alcohol or drugs, including prescription medicines. Your health care provider may also do a physical exam and blood tests. Certain medical conditions and the use of certain substances can cause symptoms similar to those associated with GAD. Your health care provider may refer you to a mental health specialist for further evaluation. TREATMENT The following therapies are usually used to treat GAD:   Medication. Antidepressant medication usually is prescribed for long-term daily control. Antianxiety medicines may be added in severe cases, especially when panic attacks occur.   Talk therapy (psychotherapy). Certain types of talk therapy can be helpful in treating GAD by providing support, education, and guidance. A form of talk therapy called cognitive behavioral therapy can teach you healthy ways to think about and react to daily life events and activities.  Stress managementtechniques. These include yoga, meditation, and exercise and can be very helpful when they are practiced regularly. A mental health specialist can help determine which treatment is best for you. Some people see improvement with one therapy. However, other people require a combination of therapies. This information is not intended to replace advice given to you by your health care provider. Make sure you discuss any questions you have with your health care provider. Document Released: 12/17/2012 Document Revised: 09/12/2014 Document Reviewed: 12/17/2012 Elsevier   Interactive Patient Education  2017 Elsevier Inc.  

## 2016-11-10 DIAGNOSIS — F341 Dysthymic disorder: Secondary | ICD-10-CM | POA: Diagnosis not present

## 2016-12-11 ENCOUNTER — Other Ambulatory Visit: Payer: Self-pay | Admitting: Family Medicine

## 2016-12-12 ENCOUNTER — Ambulatory Visit (INDEPENDENT_AMBULATORY_CARE_PROVIDER_SITE_OTHER): Payer: BLUE CROSS/BLUE SHIELD | Admitting: Family Medicine

## 2016-12-12 ENCOUNTER — Encounter: Payer: Self-pay | Admitting: Family Medicine

## 2016-12-12 VITALS — BP 126/84 | HR 103 | Temp 98.9°F | Resp 16 | Ht 61.0 in | Wt 236.0 lb

## 2016-12-12 DIAGNOSIS — E039 Hypothyroidism, unspecified: Secondary | ICD-10-CM | POA: Diagnosis not present

## 2016-12-12 DIAGNOSIS — F411 Generalized anxiety disorder: Secondary | ICD-10-CM

## 2016-12-12 DIAGNOSIS — J3089 Other allergic rhinitis: Secondary | ICD-10-CM

## 2016-12-12 MED ORDER — ALPRAZOLAM 0.5 MG PO TBDP: 0.5000 mg | ORAL_TABLET | Freq: Two times a day (BID) | ORAL | 0 refills | Status: DC | PRN

## 2016-12-12 MED ORDER — BUSPIRONE HCL 7.5 MG PO TABS: 7.5000 mg | ORAL_TABLET | Freq: Two times a day (BID) | ORAL | 1 refills | Status: DC

## 2016-12-12 MED ORDER — MONTELUKAST SODIUM 10 MG PO TABS: ORAL_TABLET | ORAL | 3 refills | Status: DC

## 2016-12-12 NOTE — Patient Instructions (Signed)
     IF you received an x-ray today, you will receive an invoice from [facility]. Please contact [facility] at [phone] with questions or concerns regarding your invoice.   IF you received labwork today, you will receive an invoice from [facility]. Please contact [facility] at [phone] with questions or concerns regarding your invoice.   Our billing staff will not be able to assist you with questions regarding bills from these companies.  You will be contacted with the lab results as soon as they are available. The fastest way to get your results is to activate your My Chart account. Instructions are located on the last page of this paperwork. If you have not heard from us regarding the results in 2 weeks, please contact this office.     

## 2016-12-12 NOTE — Progress Notes (Signed)
Subjective:    Patient ID: Meagan [name], female    DOB: 1976-03-31, 41 y.o.   MRN: [id number] Chief Complaint  Patient presents with  . Follow-up    wants to discuss anxiety medication, needs refill on singulair    HPI  Her best friend died suddently 3 yrs ago after a fall and since then she just has noticed everything piled up. She has an unhealthy relationship with her dog and got panicked about leav ing her dog  Anxious about having plane anxiety, things that used to be ok on her are not any longer. Doesn't want anxiety med as she has tried tons of meds over the years and she has a really hard time weaning off of them with the mental fog but doesn't.   She took different doses of zoloft in the past and it made her "blah"- she no longer takes this medication since she had to wean herself off of it. She also took effexor, and wellbutrin in the past which she eventually discontinued. She states her baseline is forgetful and cloudy. Lexapro - she thinks this didn't help.   She thinks it has just been anxiety when she thought she was depressed. Her mom had panic attacks and so she is hyper-aware of when sxs like panic are starting. She did this 12 mosa ago - totally irrational.  She lashes out rather than internalize - thing she was going to diet. Mom has pretty severe anxiety/depression, no bipolar in family.   She does think it would be helpful - she tried a valium before an MRI. It made her feel drunk - . Had xanax when she had panic attack during salmonella at 41 yo.   Has been eating terribly.   She gets motivated at times which wears off so she yo-yos. Eats right with weight watchers, balanced diet. She went to [facility] and they helped her on the diet regimen and helped a lot so this is just what she does Does not exercise. Feet swell when she walks. THe top of her feet burn when she walkingh Anxiety keeps her from doing the elliptical - anxiety keeps her from going to a gym. Did  low fat.   Nothing helped with the inderall and chest tightness and flutter.  Still taking the prilosec  Dr. Talmage Nap checked labs and said see you in a year Anxiety increase, yeast infxn, constipation after she  Had been less than year. Taking the generic levothyroxine.   Past Medical History:  Diagnosis Date  . Allergy    Seasonal  . Anxiety   . Complication of anesthesia    post -op nausea and vomiting at age 15  . Constipation, chronic   . Depression   . Family history of anesthesia complication    cousin woke-up during procedure  . GERD (gastroesophageal reflux disease)   . Narcolepsy   . Ruptured lumbar disc    L5  . Sleep apnea    wears CPAP nightly  . Sleep paralysis    at times  . 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 Meanwhile  Medications have become ineffective and patient gained 100 pounds, developed OSA, now on CPAP 9 cm, MSLT to follow in 04-19-13.   . Vivid dream    Past Surgical History:  Procedure Laterality Date  . CHOLECYSTECTOMY N/A 01/01/2014   Procedure: LAPAROSCOPIC CHOLECYSTECTOMY WITH INTRAOPERATIVE CHOLANGIOGRAM;  Surgeon: Wilmon Arms. Corliss Skains, MD;  Location: [facility] OR;  Service:  General;  Laterality: N/A;  . TONSILLECTOMY AND ADENOIDECTOMY  1981   age 27  . VAGINAL DELIVERY     Current Outpatient Prescriptions on File Prior to Visit  Medication Sig Dispense Refill  . desogestrel-ethinyl estradiol (KARIVA,AZURETTE,MIRCETTE) 0.15-0.02/0.01 MG (21/5) tablet Take 1 tablet by mouth daily.    . meloxicam (MOBIC) 15 MG tablet Take 1 tablet by mouth daily.  4  . omeprazole (PRILOSEC) 20 MG capsule Take 1 capsule (20 mg total) by mouth daily. 90 capsule 3   No current facility-administered medications on file prior to visit.    Allergies  Allergen Reactions  . Amoxicillin Itching and Rash   Family History  Problem Relation Age of Onset  . Alcohol abuse Mother   . Depression Mother   . Anxiety disorder Mother   . Sleep  apnea Mother   . Alcoholism Mother   . Diabetes Mother   . Alcohol abuse Father   . Alcoholism Father   . Alcohol abuse Brother   . Alcoholism Brother   . Multiple sclerosis Paternal Uncle    Social History   Social History  . Marital status: Single    Spouse name: N/A  . Number of children: 1  . Years of education: some col.   Occupational History  .  [employer]   Social History Main Topics  . Smoking status: Former Smoker    Start date: 09/05/2001  . Smokeless tobacco: Never Used     Comment: 10 years ago  . Alcohol use Yes     Comment: occasionally,one 3-7 oz of wine per week  . Drug use: No  . Sexual activity: Not Currently    Birth control/ protection: Pill     Comment: kariva   Other Topics Concern  . None   Social History Narrative   Patient is single and lives at home, her daughter lives with her.   Patient has one child.   Patient is working full-time.   Patient has some college education.   Patient is left handed.   Patient does not drink any caffeine.   Depression screen [facility] 2/9 12/12/2016 09/02/2016 07/09/2016 04/27/2015  Decreased Interest 0 0 0 0  Down, Depressed, Hopeless 0 0 0 0  PHQ - 2 Score 0 0 0 0    Review of Systems See hpi    Objective:   Physical Exam  Constitutional: She is oriented to person, place, and time. She appears well-developed and well-nourished. No distress.  HENT:  Head: Normocephalic and atraumatic.  Right Ear: Tympanic membrane, external ear and ear canal normal.  Left Ear: Tympanic membrane, external ear and ear canal normal.  Nose: Nose normal. No mucosal edema or rhinorrhea.  Mouth/Throat: Uvula is midline, oropharynx is clear and moist and mucous membranes are normal. No oropharyngeal exudate.  Eyes: Conjunctivae are normal. Right eye exhibits no discharge. Left eye exhibits no discharge. No scleral icterus.  Neck: Normal range of motion. Neck supple.  Cardiovascular: Normal rate,  regular rhythm, normal heart sounds and intact distal pulses.   Pulmonary/Chest: Effort normal and breath sounds normal.  Lymphadenopathy:    She has no cervical adenopathy.  Neurological: She is alert and oriented to person, place, and time.  Skin: Skin is warm and dry. She is not diaphoretic. No erythema.  Psychiatric: She has a normal mood and affect. Her behavior is normal.      BP 126/84 (BP Location: Right Arm, Patient Position: Sitting, Cuff  Size: Large)   Pulse (!) 103   Temp 98.9 F (37.2 C) (Oral)   Resp 16   Ht  (1.549 m)   Wt 236 lb (107 kg)   LMP 12/08/2016   SpO2 98%   BMI 44.59 kg/m   Assessment & Plan:   1. Hypothyroidism, unspecified type   2. Anxiety state   3. Other allergic rhinitis     Meds ordered this encounter  Medications  . levothyroxine (SYNTHROID, LEVOTHROID) 50 MCG tablet    Sig: Take 50 mcg by mouth daily.    Refill:  1  . montelukast (SINGULAIR) 10 MG tablet    Sig: TAKE 1 TABLET (10 MG TOTAL) BY MOUTH AT BEDTIME.    Dispense:  90 tablet    Refill:  3  . busPIRone (BUSPAR) 7.5 MG tablet    Sig: Take 1 tablet (7.5 mg total) by mouth 2 (two) times daily.    Dispense:  60 tablet    Refill:  1  . ALPRAZolam (NIRAVAM) 0.5 MG dissolvable tablet    Sig: Take 1 tablet (0.5 mg total) by mouth 2 (two) times daily as needed for anxiety.    Dispense:  20 tablet    Refill:  0     Norberto Sorenson, M.D.  Primary Care at [facility] [address] [place], [place] [address] [phone] phone [phone] fax  01/01/17 9:50 PM

## 2017-01-11 ENCOUNTER — Other Ambulatory Visit: Payer: Self-pay | Admitting: Family Medicine

## 2017-01-11 DIAGNOSIS — K219 Gastro-esophageal reflux disease without esophagitis: Secondary | ICD-10-CM

## 2017-01-11 NOTE — Progress Notes (Signed)
Subjective:    Patient ID: Meagan [name], female    DOB: 1975/10/10, 41 y.o.   MRN: [id number] Chief Complaint  Patient presents with  . Follow-up    1 month     HPI  Meagan [name] is a delightful 41 yo woman here for followup of her mood sxs.  Anxiety: Initially saw pt 1 mo prior for worsening anxiety over the past sev yrs since her best friend had a traumatic death. Gets paniced about everyday things that she used to not have any trouble with and she realizes are unreasonable. Mother was similar. Zoloft caused dysthmia/emotional blunting prior. Also failed lexapro, effexor, wellbutrin, prn hydroxyzine, and propranolol prior. c/o feeling cloudy and forgetful. 1 mo prior we started buspar 7.5 bid with prn alprazolam 0.5mg .  She is still noticing that the buspar makes her feel a little fuzzy and lightheaded in the morning and has not helped her anxiety that she has noticed. It is intermittent whether she sleeps well. She has tried the xanax and didn't feel it. Valium made her foggy but didn't seem to reliev her anxiety as well.  Obesity: Pt admited to poor diet recently with not exercising at least in part due to her anxiety sxs.  She is started on working out in early evening at [employer]! Lower extremity edema with op of feet burning when walking.. Chronic chest tightness/flutter failed prilosec and inderall.  GERD: pilosec  Hypothyroidism: followed annually by Meagan [name] last with labs in January.   OSA   Past Medical History:  Diagnosis Date  . Allergy    Seasonal  . Anxiety   . Complication of anesthesia    post -op nausea and vomiting at age 89  . Constipation, chronic   . Depression   . Family history of anesthesia complication    cousin woke-up during procedure  . GERD (gastroesophageal reflux disease)   . Narcolepsy   . Ruptured lumbar disc    L5  . Sleep apnea    wears CPAP nightly  . Sleep paralysis    at times  . Uncontrolled narcolepsy 04/03/2013   Diagnosed 2002 ,  by Meagan [name]. Meanwhile  Medications have become ineffective and patient gained 100 pounds, developed OSA, now on CPAP 9 cm, MSLT to follow in 04-19-13.   . Vivid dream    Past Surgical History:  Procedure Laterality Date  . CHOLECYSTECTOMY N/A 01/01/2014   Procedure: LAPAROSCOPIC CHOLECYSTECTOMY WITH INTRAOPERATIVE CHOLANGIOGRAM;  Surgeon: Wilmon ArmsMatthew K. Corliss Skainssuei, MD;  Location: [facility] OR;  Service: General;  Laterality: N/A;  . TONSILLECTOMY AND ADENOIDECTOMY  1981   age 89  . VAGINAL DELIVERY     Current Outpatient Prescriptions on File Prior to Visit  Medication Sig Dispense Refill  . ALPRAZolam (NIRAVAM) 0.5 MG dissolvable tablet Take 1 tablet (0.5 mg total) by mouth 2 (two) times daily as needed for anxiety. 20 tablet 0  . busPIRone (BUSPAR) 7.5 MG tablet Take 1 tablet (7.5 mg total) by mouth 2 (two) times daily. 60 tablet 1  . desogestrel-ethinyl estradiol (KARIVA,AZURETTE,MIRCETTE) 0.15-0.02/0.01 MG (21/5) tablet Take 1 tablet by mouth daily.    Marland Kitchen. levothyroxine (SYNTHROID, LEVOTHROID) 50 MCG tablet Take 50 mcg by mouth daily.  1  . meloxicam (MOBIC) 15 MG tablet Take 1 tablet by mouth daily.  4  . montelukast (SINGULAIR) 10 MG tablet TAKE 1 TABLET (10 MG TOTAL) BY MOUTH AT BEDTIME. 90 tablet 3  . omeprazole (PRILOSEC) 20 MG capsule TAKE 1 CAPSULE (20  MG TOTAL) BY MOUTH DAILY. 90 capsule 0   No current facility-administered medications on file prior to visit.    Allergies  Allergen Reactions  . Amoxicillin Itching and Rash   Family History  Problem Relation Age of Onset  . Alcohol abuse Mother   . Depression Mother   . Anxiety disorder Mother   . Sleep apnea Mother   . Alcoholism Mother   . Diabetes Mother   . Alcohol abuse Father   . Alcoholism Father   . Alcohol abuse Brother   . Alcoholism Brother   . Multiple sclerosis Paternal Uncle    Social History   Social History  . Marital status: Single    Spouse name: N/A  . Number of children: 1  . Years of education: some  col.   Occupational History  .  [employer]   Social History Main Topics  . Smoking status: Former Smoker    Start date: 09/05/2001  . Smokeless tobacco: Never Used     Comment: 10 years ago  . Alcohol use Yes     Comment: occasionally,one 3-7 oz of wine per week  . Drug use: No  . Sexual activity: Not Currently    Birth control/ protection: Pill     Comment: kariva   Other Topics Concern  . None   Social History Narrative   Patient is single and lives at home, her daughter lives with her.   Patient has one child.   Patient is working full-time.   Patient has some college education.   Patient is left handed.   Patient does not drink any caffeine.   Depression screen [facility] 2/9 01/12/2017 12/12/2016 09/02/2016 07/09/2016 04/27/2015  Decreased Interest 0 0 0 0 0  Down, Depressed, Hopeless 0 0 0 0 0  PHQ - 2 Score 0 0 0 0 0    Review of Systems See hpi    Objective:   Physical Exam  Constitutional: She is oriented to person, place, and time. She appears well-developed and well-nourished. No distress.  HENT:  Head: Normocephalic and atraumatic.  Right Ear: External ear normal.  Left Ear: External ear normal.  Eyes: Conjunctivae are normal. No scleral icterus.  Neck: Normal range of motion. Neck supple. No thyromegaly present.  Cardiovascular: Normal rate, regular rhythm, normal heart sounds and intact distal pulses.   Pulmonary/Chest: Effort normal and breath sounds normal. No respiratory distress.  Musculoskeletal: She exhibits no edema.  Lymphadenopathy:    She has no cervical adenopathy.  Neurological: She is alert and oriented to person, place, and time.  Skin: Skin is warm and dry. She is not diaphoretic. No erythema.  Psychiatric: She has a normal mood and affect. Her behavior is normal.         BP 121/86   Pulse 89   Temp 98.1 F (36.7 C) (Oral)   Resp 18   Ht 5' 1.34" (1.558 m)   Wt 234 lb 3.2 oz (106.2 kg)   LMP 01/06/2017    SpO2 96%   BMI 43.76 kg/m   Assessment & Plan:   1. Fatigue, unspecified type   2. Generalized anxiety disorder   3. Insomnia, unspecified type   4. Acquired hypothyroidism   5. Severe obesity (BMI >= 40) (HCC)    Cont current regimen - not sure the bupsar 7.5mg  bid is helping but still having some side effects so can't increase.  Try prn xanax which she already has at  home 2 tabs qhs and 1/2 tab qam. Let me know how she is doing in 2 wks. Would rec changing to klonopin from xanax when refill is due. F/u in 1-2 mos for recheck.  Orders Placed This Encounter  Procedures  . Comprehensive metabolic panel    Order Specific Question:   Has the patient fasted?    Answer:   Yes  . Thyroid Panel With TSH  . CBC  . Lipid panel    Order Specific Question:   Has the patient fasted?    Answer:   Yes  . Hemoglobin A1c  . Vitamin B12  . VITAMIN D 25 Hydroxy (Vit-D Deficiency, Fractures)  . POCT urinalysis dipstick     Norberto Sorenson, M.D.  Primary Care at [facility] [address] [place], [place] [address] [phone] phone [phone] fax  01/14/17 10:39 PM

## 2017-01-12 ENCOUNTER — Encounter: Payer: Self-pay | Admitting: Family Medicine

## 2017-01-12 ENCOUNTER — Ambulatory Visit (INDEPENDENT_AMBULATORY_CARE_PROVIDER_SITE_OTHER): Payer: BLUE CROSS/BLUE SHIELD | Admitting: Family Medicine

## 2017-01-12 VITALS — BP 121/86 | HR 89 | Temp 98.1°F | Resp 18 | Ht 61.34 in | Wt 234.2 lb

## 2017-01-12 DIAGNOSIS — R5383 Other fatigue: Secondary | ICD-10-CM | POA: Diagnosis not present

## 2017-01-12 DIAGNOSIS — G4700 Insomnia, unspecified: Secondary | ICD-10-CM | POA: Diagnosis not present

## 2017-01-12 DIAGNOSIS — E039 Hypothyroidism, unspecified: Secondary | ICD-10-CM | POA: Diagnosis not present

## 2017-01-12 DIAGNOSIS — F411 Generalized anxiety disorder: Secondary | ICD-10-CM | POA: Diagnosis not present

## 2017-01-12 LAB — POCT URINALYSIS DIP (MANUAL ENTRY)
BILIRUBIN UA: NEGATIVE
Glucose, UA: NEGATIVE mg/dL
Ketones, POC UA: NEGATIVE mg/dL
NITRITE UA: NEGATIVE
PH UA: 5.5 (ref 5.0–8.0)
PROTEIN UA: NEGATIVE mg/dL
Spec Grav, UA: 1.01 (ref 1.010–1.025)
Urobilinogen, UA: 0.2 E.U./dL

## 2017-01-13 LAB — COMPREHENSIVE METABOLIC PANEL
ALBUMIN: 3.9 g/dL (ref 3.5–5.5)
ALT: 14 IU/L (ref 0–32)
AST: 12 IU/L (ref 0–40)
Albumin/Globulin Ratio: 1.3 (ref 1.2–2.2)
Alkaline Phosphatase: 68 IU/L (ref 39–117)
BILIRUBIN TOTAL: 0.2 mg/dL (ref 0.0–1.2)
BUN/Creatinine Ratio: 16 (ref 9–23)
BUN: 14 mg/dL (ref 6–24)
CO2: 21 mmol/L (ref 18–29)
CREATININE: 0.9 mg/dL (ref 0.57–1.00)
Calcium: 9.1 mg/dL (ref 8.7–10.2)
Chloride: 102 mmol/L (ref 96–106)
GFR calc Af Amer: 91 mL/min/{1.73_m2} (ref >59)
GFR, EST NON AFRICAN AMERICAN: 79 mL/min/{1.73_m2} (ref >59)
GLOBULIN, TOTAL: 2.9 g/dL (ref 1.5–4.5)
Glucose: 98 mg/dL (ref 65–99)
Potassium: 4.6 mmol/L (ref 3.5–5.2)
Sodium: 140 mmol/L (ref 134–144)
Total Protein: 6.8 g/dL (ref 6.0–8.5)

## 2017-01-13 LAB — VITAMIN D 25 HYDROXY (VIT D DEFICIENCY, FRACTURES): Vit D, 25-Hydroxy: 32.6 ng/mL (ref 30.0–100.0)

## 2017-01-13 LAB — CBC
HEMOGLOBIN: 12.3 g/dL (ref 11.1–15.9)
Hematocrit: 38.5 % (ref 34.0–46.6)
MCH: 26.3 pg — ABNORMAL LOW (ref 26.6–33.0)
MCHC: 31.9 g/dL (ref 31.5–35.7)
MCV: 82 fL (ref 79–97)
Platelets: 383 10*3/uL — ABNORMAL HIGH (ref 150–379)
RBC: 4.68 x10E6/uL (ref 3.77–5.28)
RDW: 13.8 % (ref 12.3–15.4)
WBC: 6.6 10*3/uL (ref 3.4–10.8)

## 2017-01-13 LAB — LIPID PANEL
CHOL/HDL RATIO: 3.2 ratio (ref 0.0–4.4)
Cholesterol, Total: 228 mg/dL — ABNORMAL HIGH (ref 100–199)
HDL: 72 mg/dL (ref >39)
LDL Calculated: 130 mg/dL — ABNORMAL HIGH (ref 0–99)
TRIGLYCERIDES: 132 mg/dL (ref 0–149)
VLDL Cholesterol Cal: 26 mg/dL (ref 5–40)

## 2017-01-13 LAB — THYROID PANEL WITH TSH
Free Thyroxine Index: 1.8 (ref 1.2–4.9)
T3 UPTAKE RATIO: 15 % — AB (ref 24–39)
T4 TOTAL: 11.7 ug/dL (ref 4.5–12.0)
TSH: 2.52 u[IU]/mL (ref 0.450–4.500)

## 2017-01-13 LAB — HEMOGLOBIN A1C
ESTIMATED AVERAGE GLUCOSE: 114 mg/dL
HEMOGLOBIN A1C: 5.6 % (ref 4.8–5.6)

## 2017-01-13 LAB — VITAMIN B12: Vitamin B-12: 820 pg/mL (ref 232–1245)

## 2017-01-18 ENCOUNTER — Encounter: Payer: Self-pay | Admitting: Family Medicine

## 2017-01-24 ENCOUNTER — Encounter: Payer: Self-pay | Admitting: Family Medicine

## 2017-04-03 ENCOUNTER — Ambulatory Visit (INDEPENDENT_AMBULATORY_CARE_PROVIDER_SITE_OTHER): Payer: BLUE CROSS/BLUE SHIELD | Admitting: Family Medicine

## 2017-04-03 ENCOUNTER — Encounter: Payer: Self-pay | Admitting: Family Medicine

## 2017-04-03 VITALS — BP 132/87 | HR 88 | Temp 98.0°F | Resp 18 | Ht 61.34 in | Wt 237.6 lb

## 2017-04-03 DIAGNOSIS — F411 Generalized anxiety disorder: Secondary | ICD-10-CM

## 2017-04-03 DIAGNOSIS — R9431 Abnormal electrocardiogram [ECG] [EKG]: Secondary | ICD-10-CM

## 2017-04-03 DIAGNOSIS — Y93A9 Activity, other involving cardiorespiratory exercise: Secondary | ICD-10-CM

## 2017-04-03 MED ORDER — MELOXICAM 15 MG PO TABS: 15.0000 mg | ORAL_TABLET | Freq: Every day | ORAL | 4 refills | Status: DC | PRN

## 2017-04-03 MED ORDER — FLUOXETINE HCL 10 MG PO TABS: 10.0000 mg | ORAL_TABLET | Freq: Every day | ORAL | 3 refills | Status: DC

## 2017-04-03 NOTE — Patient Instructions (Addendum)
I wonder if low-dose naltrexone could be a beneficial way to block the opioid receptors in your gut that are likely getting filled up by sugar.   You have had some EKG changes since your ER visit in 2015. No contraindications to exercise in the meantime but I would like you to see a cardiologist to see if this is anything we need to look into further.    IF you received an x-ray today, you will receive an invoice from [facility]. Please contact [facility] at [phone] with questions or concerns regarding your invoice.   IF you received labwork today, you will receive an invoice from [facility]. Please contact [facility] at [phone] with questions or concerns regarding your invoice.   Our billing staff will not be able to assist you with questions regarding bills from these companies.  You will be contacted with the lab results as soon as they are available. The fastest way to get your results is to activate your My Chart account. Instructions are located on the last page of this paperwork. If you have not heard from us regarding the results in 2 weeks, please contact this office.

## 2017-04-03 NOTE — Progress Notes (Addendum)
Subjective:    Patient ID: Meagan [name], female    DOB: March 18, 1976, 41 y.o.   MRN: [id number] Chief Complaint  Patient presents with  . Follow-up    medications   . Medication Refill    meloxicam     HPI  Meagan [name] is a delightful 41 yo woman here for followup of her mood sxs.  Anxiety: Initially saw pt ~4 mos prior for worsening anxiety over the past sev yrs since her best friend had a traumatic death. Gets paniced about everyday things that she used to not have any trouble with and she realizes are unreasonable. Mother was similar. Zoloft caused dysthmia/emotional blunting prior. Also failed lexapro, effexor, wellbutrin, prn hydroxyzine, and propranolol prior. c/o feeling cloudy and forgetful. 1 mo prior we started buspar 7.5 bid with prn alprazolam 0.5mg .  She is still noticing that the buspar makes her feel a little fuzzy and lightheaded in the morning and has not helped her anxiety that she has noticed. It is intermittent whether she sleeps well. She has tried the xanax and didn't feel it. Valium made her foggy but didn't seem to reliev her anxiety as well.  Obesity: Pt admited to poor diet recently with not exercising at least in part due to her anxiety sxs.  She is started on working out in early evening at [employer]! Lower extremity edema with op of feet burning when walking.. Chronic chest tightness/flutter failed prilosec and inderall.  GERD: pilosec  Hypothyroidism: followed annually by Dr. Talmage NapBalan last with labs in January.   OSA   Feels like she has a bad addiction to sugar - she eats terribly.   Has gone completely sugar free for 3 mos prior and then no matter what she does a binge after that will gain it back. Her family are all addicts.  Did [place] for months and lost 35 lbs then gained it back.   Tried prozac years ago but it made her shake but  Starting to go back to school.   Has bulging L5 disc and helps with sciatica and knees.   Past Medical History:    Diagnosis Date  . Allergy    Seasonal  . Anxiety   . Complication of anesthesia    post -op nausea and vomiting at age 89  . Constipation, chronic   . Depression   . Family history of anesthesia complication    cousin woke-up during procedure  . GERD (gastroesophageal reflux disease)   . Narcolepsy   . Ruptured lumbar disc    L5  . Sleep apnea    wears CPAP nightly  . Sleep paralysis    at times  . Uncontrolled narcolepsy 04/03/2013   Diagnosed 2002 , by Dr Jetty Duhamellinton Young. Meanwhile  Medications have become ineffective and patient gained 100 pounds, developed OSA, now on CPAP 9 cm, MSLT to follow in 04-19-13.   . Vivid dream    Past Surgical History:  Procedure Laterality Date  . CHOLECYSTECTOMY N/A 01/01/2014   Procedure: LAPAROSCOPIC CHOLECYSTECTOMY WITH INTRAOPERATIVE CHOLANGIOGRAM;  Surgeon: Wilmon ArmsMatthew K. Corliss Skainssuei, MD;  Location: [facility] OR;  Service: General;  Laterality: N/A;  . TONSILLECTOMY AND ADENOIDECTOMY  1981   age 89  . VAGINAL DELIVERY     Current Outpatient Prescriptions on File Prior to Visit  Medication Sig Dispense Refill  . ALPRAZolam (NIRAVAM) 0.5 MG dissolvable tablet Take 1 tablet (0.5 mg total) by mouth 2 (two) times daily as needed for anxiety. 20 tablet 0  .  busPIRone (BUSPAR) 7.5 MG tablet Take 1 tablet (7.5 mg total) by mouth 2 (two) times daily. 60 tablet 1  . desogestrel-ethinyl estradiol (KARIVA,AZURETTE,MIRCETTE) 0.15-0.02/0.01 MG (21/5) tablet Take 1 tablet by mouth daily.    Marland Kitchen. levothyroxine (SYNTHROID, LEVOTHROID) 50 MCG tablet Take 50 mcg by mouth daily.  1  . meloxicam (MOBIC) 15 MG tablet Take 1 tablet by mouth daily.  4  . montelukast (SINGULAIR) 10 MG tablet TAKE 1 TABLET (10 MG TOTAL) BY MOUTH AT BEDTIME. 90 tablet 3  . omeprazole (PRILOSEC) 20 MG capsule TAKE 1 CAPSULE (20 MG TOTAL) BY MOUTH DAILY. 90 capsule 0   No current facility-administered medications on file prior to visit.    Allergies  Allergen Reactions  . Amoxicillin Itching and Rash    Family History  Problem Relation Age of Onset  . Alcohol abuse Mother   . Depression Mother   . Anxiety disorder Mother   . Sleep apnea Mother   . Alcoholism Mother   . Diabetes Mother   . Alcohol abuse Father   . Alcoholism Father   . Alcohol abuse Brother   . Alcoholism Brother   . Multiple sclerosis Paternal Uncle    Social History   Social History  . Marital status: Single    Spouse name: N/A  . Number of children: 1  . Years of education: some col.   Occupational History  .  [employer]   Social History Main Topics  . Smoking status: Former Smoker    Start date: 09/05/2001  . Smokeless tobacco: Never Used     Comment: 10 years ago  . Alcohol use Yes     Comment: occasionally,one 3-7 oz of wine per week  . Drug use: No  . Sexual activity: Not Currently    Birth control/ protection: Pill     Comment: kariva   Other Topics Concern  . Not on file   Social History Narrative   Patient is single and lives at home, her daughter lives with her.   Patient has one child.   Patient is working full-time.   Patient has some college education.   Patient is left handed.   Patient does not drink any caffeine.   [facility] 2/9 01/12/2017 12/12/2016 09/02/2016 07/09/2016 04/27/2015  Decreased Interest 0 0 0 0 0  Down, Depressed, Hopeless 0 0 0 0 0  PHQ - 2 Score 0 0 0 0 0    Review of Systems See hpi    Objective:   Physical Exam  Constitutional: She is oriented to person, place, and time. She appears well-developed and well-nourished. No distress.  HENT:  Head: Normocephalic and atraumatic.  Right Ear: External ear normal.  Left Ear: External ear normal.  Eyes: Conjunctivae are normal. No scleral icterus.  Neck: Normal range of motion. Neck supple. No thyromegaly present.  Cardiovascular: Normal rate, regular rhythm, normal heart sounds and intact distal pulses.   Pulmonary/Chest: Effort normal and breath sounds normal. No  respiratory distress.  Musculoskeletal: She exhibits no edema.  Lymphadenopathy:    She has no cervical adenopathy.  Neurological: She is alert and oriented to person, place, and time.  Skin: Skin is warm and dry. She is not diaphoretic. No erythema.  Psychiatric: She has a normal mood and affect. Her behavior is normal.      BP 132/87   Pulse 88   Temp 98 F (36.7 C) (Oral)   Resp 18  Ht 5' 1.34" (1.558 m)   Wt 237 lb 9.6 oz (107.8 kg)   SpO2 98%   BMI 44.40 kg/m   [facility] reading (PRIMARY) by  Dr. Clelia Croft. EKG: significant changes in leads II, III, and aVF since 2015 - reads as poss inferior infarct Assessment & Plan:   1. Activity involving cardiorespiratory exercise - pt started HIGH intensity exercise group fitness class at [employer] 3 mos ago, was worried that she might not be fit enough to exercise or if she should have some exercise clearance. Advised pt that BP and cholesterol both on high side of normal but with no other sig PMHx or FHx or any other vascular risk factors other than weight and prior sedentary life, she is at low risk - however, attained EKG in attempt to provide reassurance and encouragement to pt but it shows significant changes since 2015 -and now reads as poss inferior infarct!  I think this might be an artifact or even lead misplacement but did not resolve after attempted adjustements - rec having EKG repeated in a month and will refer to card to see if they think further eval is indicated but for now I do not think there is any indication for her to stop her current exercise program considering she is doing so well for 3 mos now  2. Abnormal finding on EKG   3. Severe obesity (BMI >= 40) (HCC) - disappointing that no weight loss even with 50 min of intense aerobic exercise and weights 3-5d/wk for the past 3 mos and not noticed any positive change in body composition to account for increased muscle contributing to weight. Pt suspect this is due to her high sugar  intake but has never had any more than very limited success with changing diet so not very motivated to try now.  Discussed consideration of trial of low dose naltrexone (as is in [place] - was on wellbutrin prior w/o benefit).  4. Generalized anxiety disorder - pt requests trial of prozac which I think is an excelllent idea. Ok to refill xanax prn - pt uses very sparingly   Hypothyroid - followed by Dr. Talmage Nap - thyroid panel nml 2.5 mos prior OSA on CPAP - last seen by neurology Dr. Vickey Huger 02/2015 with 100% compliance but persistent hypersomnia so possible narcolepsy though sxs improved some on OSA trx. She was tried on Adderall XR 10mg  qd  But then never f/u there - no report on how she responded. Discuss at f/u  Orders Placed This Encounter  Procedures  . Ambulatory referral to Cardiology    Referral Priority:   Routine    Referral Type:   Consultation    Referral Reason:   Specialty Services Required    Requested Specialty:   Cardiology    Number of Visits Requested:   1  . EKG 12-Lead    Meds ordered this encounter  Medications  . FLUoxetine (PROZAC) 10 MG tablet    Sig: Take 1 tablet (10 mg total) by mouth daily.    Dispense:  30 tablet    Refill:  3  . meloxicam (MOBIC) 15 MG tablet    Sig: Take 1 tablet (15 mg total) by mouth daily as needed for pain.    Dispense:  30 tablet    Refill:  4    Norberto Sorenson, M.D.  Primary Care at [facility] [address] [place], [place] [address] [phone] phone [phone] fax  04/03/17 3:39 PM

## 2017-04-11 ENCOUNTER — Other Ambulatory Visit: Payer: Self-pay | Admitting: Family Medicine

## 2017-04-11 DIAGNOSIS — K219 Gastro-esophageal reflux disease without esophagitis: Secondary | ICD-10-CM

## 2017-04-16 ENCOUNTER — Encounter: Payer: Self-pay | Admitting: Family Medicine

## 2017-04-26 ENCOUNTER — Telehealth: Payer: Self-pay | Admitting: Neurology

## 2017-04-26 ENCOUNTER — Encounter: Payer: Self-pay | Admitting: Family Medicine

## 2017-04-26 NOTE — Telephone Encounter (Signed)
I called and spoke with the patient and made her aware that I hadn't seen any orders for supplies sent to [place]. I copied and pasted her phone note and sent a message to [facility] asking to help follow up on this and send [place] whatever orders are needed.

## 2017-04-26 NOTE — Telephone Encounter (Signed)
Patient is calling stating [facility] has sent  a request for CPAP supplies to our office but they say no response.

## 2017-05-09 ENCOUNTER — Encounter: Payer: Self-pay | Admitting: Cardiovascular Disease

## 2017-05-09 ENCOUNTER — Ambulatory Visit (INDEPENDENT_AMBULATORY_CARE_PROVIDER_SITE_OTHER): Payer: BLUE CROSS/BLUE SHIELD | Admitting: Cardiovascular Disease

## 2017-05-09 VITALS — BP 134/87 | HR 80 | Ht 60.0 in | Wt 232.4 lb

## 2017-05-09 DIAGNOSIS — E781 Pure hyperglyceridemia: Secondary | ICD-10-CM

## 2017-05-09 DIAGNOSIS — Z9989 Dependence on other enabling machines and devices: Secondary | ICD-10-CM

## 2017-05-09 DIAGNOSIS — R9431 Abnormal electrocardiogram [ECG] [EKG]: Secondary | ICD-10-CM | POA: Diagnosis not present

## 2017-05-09 DIAGNOSIS — R0789 Other chest pain: Secondary | ICD-10-CM

## 2017-05-09 DIAGNOSIS — F419 Anxiety disorder, unspecified: Secondary | ICD-10-CM | POA: Diagnosis not present

## 2017-05-09 DIAGNOSIS — G4733 Obstructive sleep apnea (adult) (pediatric): Secondary | ICD-10-CM

## 2017-05-09 NOTE — Patient Instructions (Addendum)
Medication Instructions:  Your physician recommends that you continue on your current medications as directed. Please refer to the Current Medication list given to you today.  Labwork: Please return in 3 MONTHS for FASTING lab work (lipid panel)-lab order has been provided.  You may get this done at our in office lab M-F 8:30 4:30 (closed for lunch), no appointment needed.  Testing/Procedures: Your physician has requested that you have an exercise tolerance test. For further information please visit [email or web address]. Please also follow instruction sheet, as given.   Follow-Up: Your physician recommends that you schedule a follow-up appointment in: 3-4 MONTHS with Dr. Tresa EndoKelly.    Any Other Special Instructions Will Be Listed Below (If Applicable).     If you need a refill on your cardiac medications before your next appointment, please call your pharmacy.

## 2017-05-09 NOTE — Progress Notes (Signed)
Cardiology Office Note    Date:  05/11/2017   ID:  Meagan [name], DOB 02-Mar-1976, MRN [id number]  PCP:  Shawnee Knapp, MD  Cardiologist:  Shelva Majestic, MD   Chief Complaint  Patient presents with  . New Patient (Initial Visit)    History of Present Illness:  Meagan [name] is a 41 y.o. female who is referred for cardiology consultation through the courtesy of Dr. Delman Cheadle for evaluation of chest pain.  Meagan [name] has a long-standing history of obesity and has a history of anxiety.  She also has a history as sleep apnea and has been on CPAP therapy.  Since April 2018, she has decided to start walking and increase activity.  She started to develop some occasional episodes of arm discomfort with walking, so has experienced some vague short-lived episodes of chest discomfort.  She also metastases to some reflux symptomatology.  She had recently seen Dr. Delman Cheadle.  When seen, ECG done in her office showed new Q waves in leads 3 and aVF which were not present on her 2015 ECG.  Because of this, she was referred for cardiology evaluation.  She denies any clear-cut exertional precipitation of chest pain.  She has noticed some point tenderness to the chest wall.  She also has a history of hypothyroidism followed by Dr. Suzette Battiest. Since April, she has been exercising 4 days per week with a high intensity exercise group fitness class at [facility].  She has had laboratory done by Dr. Brigitte Pulse.  Of note, her cholesterol was elevated with a total cholesterol 228 and LDL cholesterol of 1:30, although HDL was excellent at 72, triglycerides 132 and VLDL 26.  An A1c was 5.6.  She presents for cardiology evaluation.    Past Medical History:  Diagnosis Date  . Allergy    Seasonal  . Anxiety   . Complication of anesthesia    post -op nausea and vomiting at age 53  . Constipation, chronic   . Depression   . Family history of anesthesia complication    cousin woke-up during procedure  . GERD (gastroesophageal  reflux disease)   . Narcolepsy   . Ruptured lumbar disc    L5  . Sleep apnea    wears CPAP nightly  . Sleep paralysis    at times  . 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 Meanwhile  Medications have become ineffective and patient gained 100 pounds, developed OSA, now on CPAP 9 cm, MSLT to follow in 04-19-13.   . Vivid dream     Past Surgical History:  Procedure Laterality Date  . CHOLECYSTECTOMY N/A 01/01/2014   Procedure: LAPAROSCOPIC CHOLECYSTECTOMY WITH INTRAOPERATIVE CHOLANGIOGRAM;  Surgeon: Imogene Burn. Georgette Dover, MD;  Location: [facility];  Service: General;  Laterality: N/A;  . TONSILLECTOMY AND ADENOIDECTOMY  1981   age 61  . VAGINAL DELIVERY      Current Medications: Outpatient Medications Prior to Visit  Medication Sig Dispense Refill  . ALPRAZolam (NIRAVAM) 0.5 MG dissolvable tablet Take 1 tablet (0.5 mg total) by mouth 2 (two) times daily as needed for anxiety. 20 tablet 0  . desogestrel-ethinyl estradiol (KARIVA,AZURETTE,MIRCETTE) 0.15-0.02/0.01 MG (21/5) tablet Take 1 tablet by mouth daily.    Marland Kitchen FLUoxetine (PROZAC) 10 MG tablet Take 1 tablet (10 mg total) by mouth daily. 30 tablet 3  . levothyroxine (SYNTHROID, LEVOTHROID) 50 MCG tablet Take 50 mcg by mouth daily.  1  . meloxicam (MOBIC) 15 MG tablet  Take 1 tablet (15 mg total) by mouth daily as needed for pain. 30 tablet 4  . montelukast (SINGULAIR) 10 MG tablet TAKE 1 TABLET (10 MG TOTAL) BY MOUTH AT BEDTIME. 90 tablet 3  . omeprazole (PRILOSEC) 20 MG capsule Take 1 capsule (20 mg total) by mouth daily. 90 capsule 0   No facility-administered medications prior to visit.      Allergies:   Amoxicillin   Social History   Social History  . Marital status: Single    Spouse name: N/A  . Number of children: 1  . Years of education: some col.   Occupational History  .  [place] History Main Topics  . Smoking status: Former Smoker    Start  date: 09/05/2001  . Smokeless tobacco: Never Used     Comment: 10 years ago  . Alcohol use Yes     Comment: occasionally,one 3-7 oz of wine per week  . Drug use: No  . Sexual activity: Not Currently    Birth control/ protection: Pill     Comment: kariva   Other Topics Concern  . None   Social History Narrative   Patient is single and lives at home, her daughter lives with her.   Patient has one child.   Patient is working full-time.   Patient has some college education.   Patient is left handed.   Patient does not drink any caffeine.    She attended [employer] and also [employer]  Family History:  The patien's family history includes Alcohol abuse in her brother, father, and mother; Alcoholism in her brother, father, and mother; Anxiety disorder in her mother; Depression in her mother; Diabetes in her mother; Multiple sclerosis in her paternal uncle; Sleep apnea in her mother.  Her mother and brother have obstructive sleep apnea.  ROS General: Negative; No fevers, chills, or night sweats;  HEENT: Negative; No changes in vision or hearing, sinus congestion, difficulty swallowing Pulmonary: Negative; No cough, wheezing, shortness of breath, hemoptysis Cardiovascular: See history of present illness  GI: Negative; No nausea, vomiting, diarrhea, or abdominal pain GU: Negative; No dysuria, hematuria, or difficulty voiding Musculoskeletal: Negative; no myalgias, joint pain, or weakness Hematologic/Oncology: Negative; no easy bruising, bleeding Endocrine: Negative; no heat/cold intolerance; no diabetes Neuro: Negative; no changes in balance, headaches Skin: Negative; No rashes or skin lesions Psychiatric: Positive for anxiety Sleep: Positive for OSA on CPAP therapy, restless legs, hypnogognic hallucinations, no cataplexy Other comprehensive 14 point system review is negative.   PHYSICAL EXAM:   VS:  BP 134/87   Pulse 80   Ht 5' (1.524 m)   Wt 232 lb 6.4 oz (105.4 kg)   BMI 45.39 kg/m      Repeat blood pressure by me was 112/70  Wt Readings from Last 3 Encounters:  05/09/17 232 lb 6.4 oz (105.4 kg)  04/03/17 237 lb 9.6 oz (107.8 kg)  01/12/17 234 lb 3.2 oz (106.2 kg)    General: Alert, oriented, no distress.  Skin: normal turgor, no rashes, warm and dry HEENT: Normocephalic, atraumatic. Pupils equal round and reactive to light; sclera anicteric; extraocular muscles intact; Fundi No hemorrhages or exudates. Nose without nasal septal hypertrophy Mouth/Parynx benign; Mallinpatti scale 3 Neck: No JVD, no carotid bruits; normal carotid upstroke Lungs: clear to ausculatation and percussion; no wheezing or rales Chest wall: Mild costochondral chest wall tenderness Heart: PMI not displaced, RRR, s1 s2 normal, 1/6 systolic murmur, no diastolic murmur,  no rubs, gallops, thrills, or heaves Abdomen: Significant central adiposity; soft, nontender; no hepatosplenomehaly, BS+; abdominal aorta nontender and not dilated by palpation. Back: no CVA tenderness Pulses 2+ Musculoskeletal: full range of motion, normal strength, no joint deformities Extremities: no clubbing cyanosis or edema, Homan's sign negative  Neurologic: grossly nonfocal; Cranial nerves grossly wnl Psychologic: Normal mood and affect   Studies/Labs Reviewed:   EKG:  EKG  is ordered today.  ECG (independently read by me): Normal sinus rhythm at 80 bpm.  Normal intervals.  No ST segment changes.  I compared the ECG in our office with the ECG done at urgent care.  The EKG at [facility] showed Q waves in lead 3 and aVF.  I wonder if this may be lead placement.  These changes were not present on the ECG done in our office today  Recent Labs: BMP Latest Ref Rng & Units 01/12/2017 11/17/2013  Glucose 65 - 99 mg/dL 98 111(H)  BUN 6 - 24 mg/dL 14 13  Creatinine 0.57 - 1.00 mg/dL 0.90 0.87  BUN/Creat Ratio 9 - 23 16 -  Sodium 134 - 144 mmol/L 140 140  Potassium 3.5 - 5.2 mmol/L 4.6 3.9  Chloride 96 - 106 mmol/L 102 101  CO2  18 - 29 mmol/L 21 22  Calcium 8.7 - 10.2 mg/dL 9.1 9.7     Hepatic Function Latest Ref Rng & Units 01/12/2017 11/17/2013  Total Protein 6.0 - 8.5 g/dL 6.8 7.2  Albumin 3.5 - 5.5 g/dL 3.9 3.5  AST 0 - 40 IU/L 12 29  ALT 0 - 32 IU/L 14 15  Alk Phosphatase 39 - 117 IU/L 68 78  Total Bilirubin 0.0 - 1.2 mg/dL 0.2 0.3    CBC Latest Ref Rng & Units 01/12/2017 12/16/2013 11/17/2013  WBC 3.4 - 10.8 x10E3/uL 6.6 7.9 14.8(H)  Hemoglobin 11.1 - 15.9 g/dL 12.3 13.2 13.0  Hematocrit 34.0 - 46.6 % 38.5 39.2 38.2  Platelets 150 - 379 x10E3/uL 383(H) 323 370   Lab Results  Component Value Date   MCV 82 01/12/2017   MCV 80.5 12/16/2013   MCV 79.6 11/17/2013   Lab Results  Component Value Date   TSH 2.520 01/12/2017   Lab Results  Component Value Date   HGBA1C 5.6 01/12/2017     BNP No results found for: BNP  ProBNP No results found for: PROBNP   Lipid Panel     Component Value Date/Time   CHOL 228 (H) 01/12/2017 0912   TRIG 132 01/12/2017 0912   HDL 72 01/12/2017 0912   CHOLHDL 3.2 01/12/2017 0912   LDLCALC 130 (H) 01/12/2017 0912     RADIOLOGY: No results found.   Additional studies/ records that were reviewed today include:  I reviewed the patient's office records from Dr. Brigitte Pulse    ASSESSMENT:    1. Abnormal EKG   2. Atypical chest pain   3. Morbid obesity ([place])   4. Pure hyperglyceridemia   5. OSA on CPAP   6. Anxiety      PLAN:  Meagan [name] is a very pleasant 41 year old female who has a history of morbid obesity and currently has a body mass index of 45.39.  She is committed to having a poor diet and had not routinely exercise.  Over the past 4 months, she has reinitiated an exercise program.  She has noted some initial arm discomfort and also has experienced some short-lived vague episodes of chest discomfort.  These do not clearly appear to be  exertionally precipitated.  Oftentimes they may last 30 seconds to a minute and do not occur when she is  exercising.  I suspect this is noncardiac chest pain.  However, I have strongly encouraged that she continues to exercise.  I'm scheduling her for routine exercise treadmill test, which will be very helpful for both reassurance as well as for exercise prescription.  The EKG done in our office today is entirely normal and I suspect the changes noted on the ECG at per [facility] may be artifactual due to lead placement of arm leads.  I discussed with her improved diet as well as being more aggressive with her lipids status.  I discussed recent data regarding development of subclinical atherosclerosis as well as data suggesting CAD progression.  At her current LDL level of 130.  We will repeat a lipid panel in 3 months following significant dietary adjustment and exercise and I will see her in the office for follow-up evaluation.   Medication Adjustments/Labs and Tests Ordered: Current medicines are reviewed at length with the patient today.  Concerns regarding medicines are outlined above.  Medication changes, Labs and Tests ordered today are listed in the Patient Instructions below. Patient Instructions  Medication Instructions:  Your physician recommends that you continue on your current medications as directed. Please refer to the Current Medication list given to you today.  Labwork: Please return in 3 MONTHS for FASTING lab work (lipid panel)-lab order has been provided.  You may get this done at our in office lab M-F 8:30 4:30 (closed for lunch), no appointment needed.  Testing/Procedures: Your physician has requested that you have an exercise tolerance test. For further information please visit [email or web address]. Please also follow instruction sheet, as given.   Follow-Up: Your physician recommends that you schedule a follow-up appointment in: 3-4 MONTHS with Dr. Claiborne Billings.    Any Other Special Instructions Will Be Listed Below (If Applicable).     If you need a refill on your cardiac  medications before your next appointment, please call your pharmacy.      Signed, Shelva Majestic, MD  05/11/2017 10:18 PM    [facility] [address] [place], [place], [place]  [address] Phone: [phone]

## 2017-05-17 ENCOUNTER — Ambulatory Visit: Payer: BLUE CROSS/BLUE SHIELD | Admitting: Nurse Practitioner

## 2017-05-17 ENCOUNTER — Encounter: Payer: Self-pay | Admitting: Neurology

## 2017-05-18 ENCOUNTER — Ambulatory Visit (INDEPENDENT_AMBULATORY_CARE_PROVIDER_SITE_OTHER): Payer: BLUE CROSS/BLUE SHIELD | Admitting: Neurology

## 2017-05-18 ENCOUNTER — Encounter: Payer: Self-pay | Admitting: Neurology

## 2017-05-18 VITALS — BP 117/79 | HR 79 | Ht 60.0 in | Wt 234.0 lb

## 2017-05-18 DIAGNOSIS — Z9989 Dependence on other enabling machines and devices: Secondary | ICD-10-CM

## 2017-05-18 DIAGNOSIS — G4733 Obstructive sleep apnea (adult) (pediatric): Secondary | ICD-10-CM

## 2017-05-18 NOTE — Progress Notes (Signed)
PATIENT: Meagan [name] DOB: 17-Apr-1976  REASON FOR VISIT: follow up HISTORY FROM: patient  HISTORY OF PRESENT ILLNESS:  I had the pleasure of seeing Meagan [name] today, on 05/18/2017. Her last visit was on 02/09/2015. She was just told by her durable medical equipment company, namely [facility], that she needed a revisit. The patient has been a compliant CPAP user her compliance is 100% with an average of 7 hours 16 minutes, CPAP pressure is set at 9 cm water with 27 m EPR, residual AHI is 0.5 she does not have significant air leaks her Epworth sleepiness score is low at 2 points fatigue severity is low at 21 points, she just needs new supplies.    Meagan [name] is a 41 year old female with history of obstructive sleep Apnea. She returns today for a 90 day compliance download.  She brought her machine with her 02-09-15,  the patient used to machine 100% of the last 30 days and 97% of the 4 hour requirement. Averages 6 hours and 19 minutes the machine is set at 9 cm water pressure was 2 cm EPR her residual AHI is 0.9, this is an excellent result. Airleak is mild-to-moderate. She has started to change the interface more often and this seems to help with the air leakage that was reported before.  Her Epworth score remains high at 17  points was previously  16 and 19 points on CPAP ! Persistent hypersomnia. Her fatigue severity score is 60 was previously 57. Overall she feels that the CPAP has not been helpful in treating her daytime sleepiness.  She can only drive for about 20 minutes at a time due to falling asleep. She states drinking caffeine does not work.  We discuss an  MLST- she couldn't sleep  wearing the mask , I will re order the test without CPAP in daytime. She states that she has tried various medications and has the best success with Adderall XR and i will refill the medication.  Her BP remains in the 258 systolic, she is asked not to use caffeine along site the Adderall.  We we  address her compliant of pressure marks from her nasal mask.  I will write for a respironics dream wear mask.   HISTORY 04/03/13 (CD): The patient had been diagnosed in 2002 by Dr. Baird Lyons. She reported taking Provigil but had no response the original diagnosis seems to have been made in 2002. She tried Ritalin after that again without a positive response started on Adderall which helped significantly but she hasn't needed increasing doses of the medication. At the time being that she was taking 40 mrem of Adderall XR divided into doses. Problem with her insurance coverage became evident as they did not want to cover the medicines, and considered the dose of Adderall exceedingly too high for daytime use.  The patient also weaned off Zoloft in preparation for an MS LT. As the discussed at our initial visit I was suspicious that there may be an additional sleep disorder and form of obstructive sleep apnea as the patient had gained over 100 pollens in the period between her to sleep studies now. Indeed she tested positive for obstructive sleep apnea and was placed on CPAP.  The study was read on 02/18/2013 her baseline AHI was 28.4, her RDI is 36.7 in supine her AHI reached 60 and during REM 23.5. She also had frequent limb movements some of this lead to arousal and fragmentation of  sleep the index here was 3.8 PLM related arousals. Since being off the Zoloft, her sleep has changed. She noticed having more emotional responses , and she likes this.  Her sleep time and pattern is not changed, only more dreams are noted.  The patient had been told by her mother that as a child she was a restless, kicking sleeper. The knee today 14 days after CPAP initiation to look at the compliance the AHI has been significantly reduced to 1.4 apneas per hour of sleep her CPAP is set at 9 cm water pressure visit centimeter EPR but her average daily usage at this time is only 2 hours and 17 minutes. There is an up going trend  noted in her user time and ankle which the patient now that she has received another month to continue using the CPAP for at least 4 hours at night.  As the her initial visit the patient endorses a high Epworth sleepiness score of that CPAP may have not had time yet to kick in. Today's Epworth score is 19 points and her fatigue severity score is 57 points the patient also has a symptom report of visit ringings sleep paralysis, intrusion of dreams, sleep hallucinations and narcolepsy   REVIEW OF SYSTEMS: Full 14 system review of systems performed and notable only for:  Constitutional: fatigue, body aches, obesity.  Eyes: eye itching Ear/Nose/Throat: ringing in ears  Sleep: restless leg, insomnia, apnea, frequent waking, daytime sleepiness, snoring   ALLERGIES: Allergies  Allergen Reactions  . Amoxicillin Itching and Rash    HOME MEDICATIONS: Outpatient Medications Prior to Visit  Medication Sig Dispense Refill  . ALPRAZolam (NIRAVAM) 0.5 MG dissolvable tablet Take 1 tablet (0.5 mg total) by mouth 2 (two) times daily as needed for anxiety. 20 tablet 0  . desogestrel-ethinyl estradiol (KARIVA,AZURETTE,MIRCETTE) 0.15-0.02/0.01 MG (21/5) tablet Take 1 tablet by mouth daily.    Marland Kitchen FLUoxetine (PROZAC) 10 MG tablet Take 1 tablet (10 mg total) by mouth daily. 30 tablet 3  . levothyroxine (SYNTHROID, LEVOTHROID) 50 MCG tablet Take 50 mcg by mouth daily.  1  . meloxicam (MOBIC) 15 MG tablet Take 1 tablet (15 mg total) by mouth daily as needed for pain. 30 tablet 4  . montelukast (SINGULAIR) 10 MG tablet TAKE 1 TABLET (10 MG TOTAL) BY MOUTH AT BEDTIME. 90 tablet 3  . omeprazole (PRILOSEC) 20 MG capsule Take 1 capsule (20 mg total) by mouth daily. 90 capsule 0   No facility-administered medications prior to visit.     PAST MEDICAL HISTORY: Past Medical History:  Diagnosis Date  . Allergy    Seasonal  . Anxiety   . Complication of anesthesia    post -op nausea and vomiting at age 33  .  Constipation, chronic   . Depression   . Family history of anesthesia complication    cousin woke-up during procedure  . GERD (gastroesophageal reflux disease)   . Narcolepsy   . Ruptured lumbar disc    L5  . Sleep apnea    wears CPAP nightly  . Sleep paralysis    at times  . 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 Meanwhile  Medications have become ineffective and patient gained 100 pounds, developed OSA, now on CPAP 9 cm, MSLT to follow in 04-19-13.   . Vivid dream     PAST SURGICAL HISTORY: Past Surgical History:  Procedure Laterality Date  . CHOLECYSTECTOMY N/A 01/01/2014   Procedure: LAPAROSCOPIC CHOLECYSTECTOMY WITH INTRAOPERATIVE CHOLANGIOGRAM;  Surgeon: Imogene Burn. Georgette Dover, MD;  Location: [facility];  Service: General;  Laterality: N/A;  . TONSILLECTOMY AND ADENOIDECTOMY  1981   age 55  . VAGINAL DELIVERY      FAMILY HISTORY: Family History  Problem Relation Age of Onset  . Alcohol abuse Mother   . Depression Mother   . Anxiety disorder Mother   . Sleep apnea Mother   . Alcoholism Mother   . Diabetes Mother   . Alcohol abuse Father   . Alcoholism Father   . Alcohol abuse Brother   . Alcoholism Brother   . Multiple sclerosis Paternal Uncle     SOCIAL HISTORY: Social History   Social History  . Marital status: Single    Spouse name: N/A  . Number of children: 1  . Years of education: some col.   Occupational History  .  [place] History Main Topics  . Smoking status: Former Smoker    Start date: 09/05/2001  . Smokeless tobacco: Never Used     Comment: 10 years ago  . Alcohol use Yes     Comment: occasionally,one 3-7 oz of wine per week  . Drug use: No  . Sexual activity: Not Currently    Birth control/ protection: Pill     Comment: kariva   Other Topics Concern  . Not on file   Social History Narrative   Patient is single and lives at home, her daughter lives with her.    Patient has one child.   Patient is working full-time.   Patient has some college education.   Patient is left handed.   Patient does not drink any caffeine.      PHYSICAL EXAM  Vitals:   05/18/17 0929  BP: 117/79  Pulse: 79  Weight: 234 lb (106.1 kg)  Height: 5' (1.524 m)   Body mass index is 45.7 kg/m.  Generalized: Well developed, in no acute distress  Neck: circumference 16 inches, Mallampati 3-4, no braces and  no retainers.  Bruxism.    Neurological examination  Mentation: Alert oriented to time, place, history taking. Follows all commands speech and language fluent Cranial nerve - taste an smell have decreased- changes towards more hot sauce, dark chocolates. Smell is blunted.  Pupils were equal round reactive to light. Extraocular movements were full, visual field were full on confrontational test. Facial sensation and strength were normal.  Uvula and  Tongue move  midline. Head turning and shoulder shrug  were normal and symmetric. Motor:  symmetric motor tone and strength is noted throughout.  Sensory:all primary modalities intact. Coordination: normal finger-nose-finger , normal handwriting skills. Gait and station: Gait is normal.  Reflexes: Deep tendon reflexes are symmetric  bilaterally.     DIAGNOSTIC DATA (LABS, IMAGING, TESTING) - I reviewed patient records, labs, notes, testing and imaging myself where available.  Lab Results  Component Value Date   WBC 6.6 01/12/2017   HGB 12.3 01/12/2017   HCT 38.5 01/12/2017   MCV 82 01/12/2017   PLT 383 (H) 01/12/2017      Component Value Date/Time   NA 140 01/12/2017 0912   K 4.6 01/12/2017 0912   CL 102 01/12/2017 0912   CO2 21 01/12/2017 0912   GLUCOSE 98 01/12/2017 0912   GLUCOSE 111 (H) 11/17/2013 0540   BUN 14 01/12/2017 0912   CREATININE 0.90 01/12/2017 0912   CALCIUM 9.1 01/12/2017 0912   PROT 6.8 01/12/2017 0912  ALBUMIN 3.9 01/12/2017 0912   AST 12 01/12/2017 0912   ALT 14 01/12/2017 0912    ALKPHOS 68 01/12/2017 0912   BILITOT 0.2 01/12/2017 0912   GFRNONAA 79 01/12/2017 0912   GFRAA 91 01/12/2017 0912       ASSESSMENT AND PLAN 41 y.o. year old female  has a past medical history of Allergy; Anxiety; Complication of anesthesia; Constipation, chronic; Depression; Family history of anesthesia complication; GERD (gastroesophageal reflux disease); Narcolepsy; Ruptured lumbar disc; Sleep apnea; Sleep paralysis; Uncontrolled narcolepsy (04/03/2013); and Vivid dream. here with:  1. OSA on CPAP, 100% compliance on 05-18-2017, negative HLA test in 2016.  No longer persistent hypersomnia - she is now endorsed the Epworth at 4 points. She just implemented sleep routines, set bed time and rise time. Protected sleep  time. No longer on Adderall. She should follow-up in 12 months with me,  Dr. Brett Fairy, or NP .   Larey Seat, MD   05/18/2017, 10:01 AM [facility] [address] [place], [place] [address] [phone]  Note: This document was prepared with digital dictation and possible smart phrase technology. Any transcriptional errors that result from this process are unintentional.

## 2017-05-22 DIAGNOSIS — G4733 Obstructive sleep apnea (adult) (pediatric): Secondary | ICD-10-CM | POA: Diagnosis not present

## 2017-05-24 ENCOUNTER — Telehealth (HOSPITAL_COMMUNITY): Payer: Self-pay

## 2017-05-24 NOTE — Telephone Encounter (Signed)
encounter complete

## 2017-05-25 ENCOUNTER — Inpatient Hospital Stay (HOSPITAL_COMMUNITY): Admission: RE | Admit: 2017-05-25 | Payer: BLUE CROSS/BLUE SHIELD | Source: Ambulatory Visit

## 2017-06-14 ENCOUNTER — Ambulatory Visit: Payer: BLUE CROSS/BLUE SHIELD | Admitting: Family Medicine

## 2017-06-23 ENCOUNTER — Encounter: Payer: Self-pay | Admitting: Family Medicine

## 2017-06-23 ENCOUNTER — Ambulatory Visit (INDEPENDENT_AMBULATORY_CARE_PROVIDER_SITE_OTHER): Payer: BLUE CROSS/BLUE SHIELD | Admitting: Family Medicine

## 2017-06-23 VITALS — BP 138/82 | HR 94 | Temp 98.1°F | Resp 16 | Ht 61.42 in | Wt 234.0 lb

## 2017-06-23 DIAGNOSIS — H66001 Acute suppurative otitis media without spontaneous rupture of ear drum, right ear: Secondary | ICD-10-CM | POA: Diagnosis not present

## 2017-06-23 MED ORDER — AZITHROMYCIN 250 MG PO TABS: ORAL_TABLET | ORAL | 0 refills | Status: DC

## 2017-06-23 NOTE — Patient Instructions (Addendum)
Thanks for coming in today. Start azithromycin for right ear.  Saline nasal spray as needed, Afrin if needed for up to 3 days.   Return to the clinic or go to the nearest emergency room if any of your symptoms worsen or new symptoms occur.   Otitis Media, Adult Otitis media occurs when there is inflammation and fluid in the middle ear. Your middle ear is a part of the ear that contains bones for hearing as well as air that helps send sounds to your brain. What are the causes? This condition is caused by a blockage in the eustachian tube. This tube drains fluid from the ear to the back of the nose (nasopharynx). A blockage in this tube can be caused by an object or by swelling (edema) in the tube. Problems that can cause a blockage include:  A cold or other upper respiratory infection.  Allergies.  An irritant, such as tobacco smoke.  Enlarged adenoids. The adenoids are areas of soft tissue located high in the back of the throat, behind the nose and the roof of the mouth.  A mass in the nasopharynx.  Damage to the ear caused by pressure changes (barotrauma).  What are the signs or symptoms? Symptoms of this condition include:  Ear pain.  A fever.  Decreased hearing.  A headache.  Tiredness (lethargy).  Fluid leaking from the ear.  Ringing in the ear.  How is this diagnosed? This condition is diagnosed with a physical exam. During the exam your health care provider will use an instrument called an otoscope to look into your ear and check for redness, swelling, and fluid. He or she will also ask about your symptoms. Your health care provider may also order tests, such as:  A test to check the movement of the eardrum (pneumatic otoscopy). This test is done by squeezing a small amount of air into the ear.  A test that changes air pressure in the middle ear to check how well the eardrum moves and whether the eustachian tube is working (tympanogram).  How is this  treated? This condition usually goes away on its own within 3-5 days. But if the condition is caused by a bacteria infection and does not go away own its own, or keeps coming back, your health care provider may:  Prescribe antibiotic medicines to treat the infection.  Prescribe or recommend medicines to control pain.  Follow these instructions at home:  Take over-the-counter and prescription medicines only as told by your health care provider.  If you were prescribed an antibiotic medicine, take it as told by your health care provider. Do not stop taking the antibiotic even if you start to feel better.  Keep all follow-up visits as told by your health care provider. This is important. Contact a health care provider if:  You have bleeding from your nose.  There is a lump on your neck.  You are not getting better in 5 days.  You feel worse instead of better. Get help right away if:  You have severe pain that is not controlled with medicine.  You have swelling, redness, or pain around your ear.  You have stiffness in your neck.  A part of your face is paralyzed.  The bone behind your ear (mastoid) is tender when you touch it.  You develop a severe headache. Summary  Otitis media is redness, soreness, and swelling of the middle ear.  This condition usually goes away on its own within 3-5 days.  If the problem does not go away in 3-5 days, your health care provider may prescribe or recommend medicines to treat your symptoms.  If you were prescribed an antibiotic medicine, take it as told by your health care provider. This information is not intended to replace advice given to you by your health care provider. Make sure you discuss any questions you have with your health care provider. Document Released: 05/27/2004 Document Revised: 08/12/2016 Document Reviewed: 08/12/2016 Elsevier Interactive Patient Education  2017 [employer]   IF you received an x-ray today, you  will receive an invoice from [facility]. Please contact [facility] at [phone] with questions or concerns regarding your invoice.   IF you received labwork today, you will receive an invoice from [facility]. Please contact [facility] at [phone] with questions or concerns regarding your invoice.   Our billing staff will not be able to assist you with questions regarding bills from these companies.  You will be contacted with the lab results as soon as they are available. The fastest way to get your results is to activate your My Chart account. Instructions are located on the last page of this paperwork. If you have not heard from us regarding the results in 2 weeks, please contact this office.

## 2017-06-23 NOTE — Progress Notes (Signed)
Subjective:  By signing my name below, I, Stann Ore, attest that this documentation has been prepared under the direction and in the presence of Meredith Staggers, MD. Electronically Signed: Stann Ore, Scribe. 06/23/2017 , 2:47 PM .  Patient was seen in Room 8 .   Patient ID: Meagan [name], female    DOB: Oct 01, 1975, 41 y.o.   MRN: [id number] Chief Complaint  Patient presents with  . Cough    with some nasal congestion   . Ear Pain    right ear x 2 weeks    HPI Meagan [name] is a 41 y.o. female  Patient reports her symptoms started about 2 weeks ago, with cough, congestion, rhinorrhea, some body aches and headaches. She initially thought it was the flu, but didn't have a fever. She restarted her flonase and took singulair 2 weeks ago. Her symptoms improved a week ago, but congestion is still present. She had an appointment to come in last week, but cancelled as her symptoms improved. However, congestion worsened with ear pain (R>L) 6 days ago. She mentions flonase caused her nosebleeds and that's why she stopped it initially. She's allergic to amoxicillin.   She notes coughing up discolored sputum in the morning. She states her ears (R>L) have been getting worse, feeling stuffed and blocked up. She denies shortness of breath, wheezing or fever. She denies any sick contact at home. 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 She mentions ear infection going around at the daycare.   Patient Active Problem List   Diagnosis Date Noted  . Hypersomnia with sleep apnea 02/09/2015  . Severe obesity (BMI >= 40) (HCC) 02/09/2015  . OSA on CPAP 02/09/2015  . Chronic cholecystitis with calculus 11/25/2013  . Uncontrolled narcolepsy 04/03/2013  . Hypersomnia, persistent 01/11/2013   Past Medical History:  Diagnosis Date  . Allergy    Seasonal  . Anxiety   . Complication of anesthesia    post -op nausea and vomiting at age 28  . Constipation, chronic   .  Depression   . Family history of anesthesia complication    cousin woke-up during procedure  . GERD (gastroesophageal reflux disease)   . Narcolepsy   . Ruptured lumbar disc    L5  . Sleep apnea    wears CPAP nightly  . Sleep paralysis    at times  . 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 Meanwhile  Medications have become ineffective and patient gained 100 pounds, developed OSA, now on CPAP 9 cm, MSLT to follow in 04-19-13.   . Vivid dream    Past Surgical History:  Procedure Laterality Date  . CHOLECYSTECTOMY N/A 01/01/2014   Procedure: LAPAROSCOPIC CHOLECYSTECTOMY WITH INTRAOPERATIVE CHOLANGIOGRAM;  Surgeon: Wilmon Arms. Corliss Skains, MD;  Location: [facility] OR;  Service: General;  Laterality: N/A;  . TONSILLECTOMY AND ADENOIDECTOMY  1981   age 54  . VAGINAL DELIVERY     Allergies  Allergen Reactions  . Amoxicillin Itching and Rash   Prior to Admission medications   Medication Sig Start Date End Date Taking? Authorizing Provider  ALPRAZolam (NIRAVAM) 0.5 MG dissolvable tablet Take 1 tablet (0.5 mg total) by mouth 2 (two) times daily as needed for anxiety. 12/12/16   Sherren Mocha, MD  desogestrel-ethinyl estradiol (KARIVA,AZURETTE,MIRCETTE) 0.15-0.02/0.01 MG (21/5) tablet Take 1 tablet by mouth daily.    [provider]  FLUoxetine (PROZAC) 10 MG tablet Take 1 tablet (10 mg total) by  mouth daily. 04/03/17   Sherren Mocha, MD  levothyroxine (SYNTHROID, LEVOTHROID) 50 MCG tablet Take 50 mcg by mouth daily. 10/05/16   [provider]  meloxicam (MOBIC) 15 MG tablet Take 1 tablet (15 mg total) by mouth daily as needed for pain. 04/03/17   Sherren Mocha, MD  montelukast (SINGULAIR) 10 MG tablet TAKE 1 TABLET (10 MG TOTAL) BY MOUTH AT BEDTIME. 12/12/16   Sherren Mocha, MD  omeprazole (PRILOSEC) 20 MG capsule Take 1 capsule (20 mg total) by mouth daily. 04/11/17   Sherren Mocha, MD   Social History   Social History  . Marital status: Single    Spouse name: N/A  .  Number of children: 1  . Years of education: some col.   Occupational History  .  [employer]   Social History Main Topics  . Smoking status: Former Smoker    Start date: 09/05/2001  . Smokeless tobacco: Never Used     Comment: 10 years ago  . Alcohol use Yes     Comment: occasionally,one 3-7 oz of wine per week  . Drug use: No  . Sexual activity: Not Currently    Birth control/ protection: Pill     Comment: kariva   Other Topics Concern  . Not on file   Social History Narrative   Patient is single and lives at home, her daughter lives with her.   Patient has one child.   Patient is working full-time.   Patient has some college education.   Patient is left handed.   Patient does not drink any caffeine.   Review of Systems  Constitutional: Negative for chills, fatigue, fever and unexpected weight change.  HENT: Positive for congestion, ear pain (right) and hearing loss.   Respiratory: Positive for cough. Negative for chest tightness, shortness of breath and wheezing.   Gastrointestinal: Negative for constipation, diarrhea, nausea and vomiting.  Skin: Negative for rash and wound.  Neurological: Negative for dizziness, weakness and headaches.       Objective:   Physical Exam  Constitutional: She is oriented to person, place, and time. She appears well-developed and well-nourished. No distress.  HENT:  Head: Normocephalic and atraumatic.  Right Ear: Hearing, external ear and ear canal normal. Tympanic membrane is erythematous. A middle ear effusion (light yellow) is present.  Left Ear: Hearing, tympanic membrane, external ear and ear canal normal.  Nose: Nose normal.  Mouth/Throat: Oropharynx is clear and moist. No oropharyngeal exudate.  Right ear: erythematous TM with light yellow effusion behind TM, no rupture, canal is clear  Eyes: Pupils are equal, round, and reactive to light. Conjunctivae and EOM are normal.  Cardiovascular: Normal  rate, regular rhythm, normal heart sounds and intact distal pulses.   No murmur heard. Pulmonary/Chest: Effort normal and breath sounds normal. No respiratory distress. She has no wheezes. She has no rhonchi.  Neurological: She is alert and oriented to person, place, and time.  Skin: Skin is warm and dry. No rash noted.  Psychiatric: She has a normal mood and affect. Her behavior is normal.  Vitals reviewed.   Vitals:   06/23/17 1359  BP: 138/82  Pulse: 94  Resp: 16  Temp: 98.1 F (36.7 C)  TempSrc: Oral  SpO2: 99%  Weight: 234 lb (106.1 kg)  Height: 5' 1.42" (1.56 m)      Assessment & Plan:    Meagan [name] is a 41 y.o. female Acute suppurative  otitis media of right ear without spontaneous rupture of tympanic membrane, recurrence not specified - Plan: azithromycin (ZITHROMAX) 250 MG tablet Suspected initial viral respiratory infection with improvement then worsening, now with right otitis media.  -Start azithromycin, Z-Pak.  -Symptomatic care discussed with saline nasal spray, Flonase, temporary Afrin if needed for congestion. RTC precautions  Meds ordered this encounter  Medications  . fluticasone (FLONASE) 50 MCG/ACT nasal spray    Sig: Place 2 sprays into both nostrils daily.  Marland Kitchen azithromycin (ZITHROMAX) 250 MG tablet    Sig: Take 2 pills by mouth on day 1, then 1 pill by mouth per day on days 2 through 5.    Dispense:  6 tablet    Refill:  0   Patient Instructions   Thanks for coming in today. Start azithromycin for right ear.  Saline nasal spray as needed, Afrin if needed for up to 3 days.   Return to the clinic or go to the nearest emergency room if any of your symptoms worsen or new symptoms occur.   Otitis Media, Adult Otitis media occurs when there is inflammation and fluid in the middle ear. Your middle ear is a part of the ear that contains bones for hearing as well as air that helps send sounds to your brain. What are the causes? This condition is caused by  a blockage in the eustachian tube. This tube drains fluid from the ear to the back of the nose (nasopharynx). A blockage in this tube can be caused by an object or by swelling (edema) in the tube. Problems that can cause a blockage include:  A cold or other upper respiratory infection.  Allergies.  An irritant, such as tobacco smoke.  Enlarged adenoids. The adenoids are areas of soft tissue located high in the back of the throat, behind the nose and the roof of the mouth.  A mass in the nasopharynx.  Damage to the ear caused by pressure changes (barotrauma).  What are the signs or symptoms? Symptoms of this condition include:  Ear pain.  A fever.  Decreased hearing.  A headache.  Tiredness (lethargy).  Fluid leaking from the ear.  Ringing in the ear.  How is this diagnosed? This condition is diagnosed with a physical exam. During the exam your health care provider will use an instrument called an otoscope to look into your ear and check for redness, swelling, and fluid. He or she will also ask about your symptoms. Your health care provider may also order tests, such as:  A test to check the movement of the eardrum (pneumatic otoscopy). This test is done by squeezing a small amount of air into the ear.  A test that changes air pressure in the middle ear to check how well the eardrum moves and whether the eustachian tube is working (tympanogram).  How is this treated? This condition usually goes away on its own within 3-5 days. But if the condition is caused by a bacteria infection and does not go away own its own, or keeps coming back, your health care provider may:  Prescribe antibiotic medicines to treat the infection.  Prescribe or recommend medicines to control pain.  Follow these instructions at home:  Take over-the-counter and prescription medicines only as told by your health care provider.  If you were prescribed an antibiotic medicine, take it as told by your  health care provider. Do not stop taking the antibiotic even if you start to feel better.  Keep all follow-up visits  as told by your health care provider. This is important. Contact a health care provider if:  You have bleeding from your nose.  There is a lump on your neck.  You are not getting better in 5 days.  You feel worse instead of better. Get help right away if:  You have severe pain that is not controlled with medicine.  You have swelling, redness, or pain around your ear.  You have stiffness in your neck.  A part of your face is paralyzed.  The bone behind your ear (mastoid) is tender when you touch it.  You develop a severe headache. Summary  Otitis media is redness, soreness, and swelling of the middle ear.  This condition usually goes away on its own within 3-5 days.  If the problem does not go away in 3-5 days, your health care provider may prescribe or recommend medicines to treat your symptoms.  If you were prescribed an antibiotic medicine, take it as told by your health care provider. This information is not intended to replace advice given to you by your health care provider. Make sure you discuss any questions you have with your health care provider. Document Released: 05/27/2004 Document Revised: 08/12/2016 Document Reviewed: 08/12/2016 Elsevier Interactive Patient Education  2017 [employer]   IF you received an x-ray today, you will receive an invoice from [facility]. Please contact [facility] at [phone] with questions or concerns regarding your invoice.   IF you received labwork today, you will receive an invoice from [facility]. Please contact [facility] at [phone] with questions or concerns regarding your invoice.   Our billing staff will not be able to assist you with questions regarding bills from these companies.  You will be contacted with the lab results as soon as they are available. The fastest way to get your  results is to activate your My Chart account. Instructions are located on the last page of this paperwork. If you have not heard from us regarding the results in 2 weeks, please contact this office.       I personally performed the services described in this documentation, which was scribed in my presence. The recorded information has been reviewed and considered for accuracy and completeness, addended by me as needed, and agree with information above.  Signed,   Meredith StaggersJeffrey Aianna Fahs, MD Primary [facility] at [facility].  06/23/17 3:00 PM

## 2017-07-17 DIAGNOSIS — E02 Subclinical iodine-deficiency hypothyroidism: Secondary | ICD-10-CM | POA: Diagnosis not present

## 2017-07-19 ENCOUNTER — Other Ambulatory Visit: Payer: Self-pay | Admitting: Family Medicine

## 2017-07-19 DIAGNOSIS — K219 Gastro-esophageal reflux disease without esophagitis: Secondary | ICD-10-CM

## 2017-07-19 NOTE — Telephone Encounter (Signed)
Pt. had an acute care visit for otitis media 06/2017.  Rec'd req from pharmacy for Omeprazole; gave one refill of Omeprazole.  Will have office assess for need of physical.

## 2017-08-06 ENCOUNTER — Other Ambulatory Visit: Payer: Self-pay | Admitting: Family Medicine

## 2017-08-10 ENCOUNTER — Ambulatory Visit: Payer: BLUE CROSS/BLUE SHIELD | Admitting: Cardiovascular Disease

## 2017-08-11 DIAGNOSIS — Z6841 Body Mass Index (BMI) 40.0 and over, adult: Secondary | ICD-10-CM | POA: Diagnosis not present

## 2017-08-11 DIAGNOSIS — N898 Other specified noninflammatory disorders of vagina: Secondary | ICD-10-CM | POA: Diagnosis not present

## 2017-08-11 DIAGNOSIS — Z01419 Encounter for gynecological examination (general) (routine) without abnormal findings: Secondary | ICD-10-CM | POA: Diagnosis not present

## 2017-08-11 DIAGNOSIS — Z124 Encounter for screening for malignant neoplasm of cervix: Secondary | ICD-10-CM | POA: Diagnosis not present

## 2017-08-11 DIAGNOSIS — Z3040 Encounter for surveillance of contraceptives, unspecified: Secondary | ICD-10-CM | POA: Diagnosis not present

## 2017-08-11 DIAGNOSIS — Z1231 Encounter for screening mammogram for malignant neoplasm of breast: Secondary | ICD-10-CM | POA: Diagnosis not present

## 2017-08-26 ENCOUNTER — Ambulatory Visit: Payer: BLUE CROSS/BLUE SHIELD | Admitting: Family Medicine

## 2017-08-26 ENCOUNTER — Encounter: Payer: Self-pay | Admitting: Family Medicine

## 2017-08-26 ENCOUNTER — Other Ambulatory Visit: Payer: Self-pay

## 2017-08-26 VITALS — BP 118/76 | HR 108 | Temp 97.0°F | Resp 16 | Ht 61.42 in | Wt 239.8 lb

## 2017-08-26 DIAGNOSIS — M5441 Lumbago with sciatica, right side: Secondary | ICD-10-CM

## 2017-08-26 DIAGNOSIS — F411 Generalized anxiety disorder: Secondary | ICD-10-CM

## 2017-08-26 DIAGNOSIS — Z6841 Body Mass Index (BMI) 40.0 and over, adult: Secondary | ICD-10-CM | POA: Diagnosis not present

## 2017-08-26 DIAGNOSIS — M5136 Other intervertebral disc degeneration, lumbar region: Secondary | ICD-10-CM | POA: Diagnosis not present

## 2017-08-26 DIAGNOSIS — M25561 Pain in right knee: Secondary | ICD-10-CM | POA: Diagnosis not present

## 2017-08-26 DIAGNOSIS — M25562 Pain in left knee: Secondary | ICD-10-CM

## 2017-08-26 MED ORDER — FLUOXETINE HCL 10 MG PO TABS
10.0000 mg | ORAL_TABLET | Freq: Every day | ORAL | 3 refills | Status: DC
Start: 2017-08-26 — End: 2018-07-13

## 2017-08-26 MED ORDER — MELOXICAM 15 MG PO TABS: 15.0000 mg | ORAL_TABLET | Freq: Every day | ORAL | 4 refills | Status: DC | PRN

## 2017-08-26 MED ORDER — BUPROPION HCL ER (SR) 100 MG PO TB12: 100.0000 mg | ORAL_TABLET | Freq: Every day | ORAL | 1 refills | Status: DC

## 2017-08-26 NOTE — Patient Instructions (Addendum)
IF you received an x-ray today, you will receive an invoice from [facility]. Please contact [facility] at [phone] with questions or concerns regarding your invoice.   IF you received labwork today, you will receive an invoice from [facility]. Please contact [facility] at [phone] with questions or concerns regarding your invoice.   Our billing staff will not be able to assist you with questions regarding bills from these companies.  You will be contacted with the lab results as soon as they are available. The fastest way to get your results is to activate your My Chart account. Instructions are located on the last page of this paperwork. If you have not heard from us regarding the results in 2 weeks, please contact this office.     Serving Sizes A serving size is a measured amount of food or drink, such as one slice of bread, that has an associated nutrient content. Knowing the serving size of a food or drink can help you determine how much of that food you should consume. What is the size of one serving? The size of one healthy serving depends on the food or drink. To determine a serving size, read the food label. If the food or drink does not have a food label, try to find serving size information online. Or, use the following to estimate the size of one adult serving: Grain 1 slice bread.  bagel.  cup pasta. Vegetable  cup cooked or canned vegetables. 1 cup raw, leafy greens. Fruit  cup canned fruit. 1 medium fruit.  cup dried fruit. Meat and Other Protein Sources 1 oz meat, poultry, or fish.  cup cooked beans. 1 egg.  cup nuts or seeds. 1 Tbsp nut butter.  cup tofu or tempeh. 2 Tbsp hummus. Dairy An individual container of yogurt (6-8 oz). 1 piece of cheese the size of your thumb (1 oz). 1 cup (8 oz) milk or milk alternative. Fat A piece the size of one dice. 1 tsp soft margarine. 1 Tbsp mayonnaise. 1 tsp vegetable oil. 1 Tbsp regular salad dressing. 2  Tbsp low-fat salad dressing. How many servings should I eat from each food group each day? The following are the suggested number of servings to try and have every day from each food group. You can also look at your eating throughout the week and aim for meeting these requirements on most days for overall healthy eating. Grain 6-8 servings. Try to have half of your grains from whole grains, such as whole wheat bread, corn tortillas, oatmeal, brown rice, whole wheat pasta, and bulgur. Vegetable At least 2-3 servings. Fruit 2 servings. Meat and Other Protein Foods 5-6 servings. Aim to have lean proteins, such as chicken, [place], fish, beans, or tofu. Dairy 3 servings. Choose low-fat or nonfat if you are trying to control your weight. Fat 2-3 servings. Is a serving the same thing as a portion? No. A portion is the actual amount you eat, which may be more than one serving. Knowing the specific serving size of a food and the nutritional information that goes with it can help you make a healthy decision on what size portion to eat. What are some tips to help me learn healthy serving sizes?  Check food labels for serving sizes. Many foods that come as a single portion actually contain multiple servings.  Determine the serving size of foods you commonly eat and figure out how large a portion you usually eat.  Measure the number of servings that can be  held by the bowls, glasses, cups, and plates you typically use. For example, pour your breakfast cereal into your regular bowl and then pour it into a measuring cup.  For 1-2 days, measure the serving sizes of all the foods you eat.  Practice estimating serving sizes and determining how big your portions should be. This information is not intended to replace advice given to you by your health care provider. Make sure you discuss any questions you have with your health care provider. Document Released: 05/21/2003 Document Revised: 04/16/2016  Document Reviewed: 11/19/2013 Elsevier Interactive Patient Education  [employer]2018 Elsevier Inc.

## 2017-08-26 NOTE — Progress Notes (Signed)
Subjective:  By signing my name below, I, Stann Ore, attest that this documentation has been prepared under the direction and in the presence of Norberto Sorenson, MD. Electronically Signed: Stann Ore, Scribe. 08/26/2017 , 9:58 AM .  Patient was seen in Room 3 .   Patient ID: Meagan [name], female    DOB: 1976-02-25, 41 y.o.   MRN: [id number] Chief Complaint  Patient presents with  . Follow-up    medication change, anxiety, weight    HPI Meagan [name] is a 41 y.o. female who presents to Primary Care at [facility] for follow up on medication change, anxiety and weight.   Weight She's been going to [employer] 3-5 times a week. But, about 2 weeks ago, she had medial knee pain (L>R) to the point where she had difficulty walking up the stairs. So, she didn't go to [facility] for that week, and then another week due to inclement weather. She's been using TRX straps at home for resistant exercise.   Wt Readings from Last 3 Encounters:  08/26/17 239 lb 12.8 oz (108.8 kg)  06/23/17 234 lb (106.1 kg)  05/18/17 234 lb (106.1 kg)   Her endocrinologist, Dr. Talmage Nap, suggested her to stop eating carbs. However, patient believes there was a mental snap where she started binging. She tries to eat healthier at home, but she still eats fast food, sugars, and going out to eat often. Her thyroid levels have been doing well.   Anxiety Patient states Prozac has been doing well. Her anxiety is still present, but doesn't feel like it's taken over her life.   Sciatic pain She's had bilateral sciatic pain (R>L) ongoing for about 2 years now. She has received an injection, done by Dr. Charlett Blake at [facility], in the past without relief. When she's at work, she sometimes has to sit with her legs stretched out in front of her. She takes mobic QD without abdominal symptoms.   Past Medical History:  Diagnosis Date  . Allergy    Seasonal  . Anxiety   . Complication of anesthesia    post -op nausea and  vomiting at age 73  . Constipation, chronic   . Depression   . Family history of anesthesia complication    cousin woke-up during procedure  . GERD (gastroesophageal reflux disease)   . Narcolepsy   . Ruptured lumbar disc    L5  . Sleep apnea    wears CPAP nightly  . Sleep paralysis    at times  . 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 Meanwhile  Medications have become ineffective and patient gained 100 pounds, developed OSA, now on CPAP 9 cm, MSLT to follow in 04-19-13.   . Vivid dream    Past Surgical History:  Procedure Laterality Date  . CHOLECYSTECTOMY N/A 01/01/2014   Procedure: LAPAROSCOPIC CHOLECYSTECTOMY WITH INTRAOPERATIVE CHOLANGIOGRAM;  Surgeon: Wilmon Arms. Corliss Skains, MD;  Location: [facility] OR;  Service: General;  Laterality: N/A;  . TONSILLECTOMY AND ADENOIDECTOMY  1981   age 63  . VAGINAL DELIVERY     Prior to Admission medications   Medication Sig Start Date End Date Taking? Authorizing Provider  ALPRAZolam (NIRAVAM) 0.5 MG dissolvable tablet Take 1 tablet (0.5 mg total) by mouth 2 (two) times daily as needed for anxiety. 12/12/16   Sherren Mocha, MD  azithromycin (ZITHROMAX) 250 MG tablet Take 2 pills by mouth on day 1, then 1 pill by mouth per day on  days 2 through 5. 06/23/17   Shade FloodGreene, Jeffrey R, MD  desogestrel-ethinyl estradiol (KARIVA,AZURETTE,MIRCETTE) 0.15-0.02/0.01 MG (21/5) tablet Take 1 tablet by mouth daily.    [provider]  FLUoxetine (PROZAC) 10 MG tablet Take 1 tablet (10 mg total) by mouth daily. Office visit needed for refills 08/07/17   Sherren MochaShaw, Yuval Rubens N, MD  fluticasone [facility](FLONASE) 50 MCG/ACT nasal spray Place 2 sprays into both nostrils daily.    [provider]  levothyroxine (SYNTHROID, LEVOTHROID) 50 MCG tablet Take 50 mcg by mouth daily. 10/05/16   [provider]  meloxicam (MOBIC) 15 MG tablet Take 1 tablet (15 mg total) by mouth daily as needed for pain. 04/03/17   Sherren MochaShaw, Dois Juarbe N, MD  montelukast (SINGULAIR)  10 MG tablet TAKE 1 TABLET (10 MG TOTAL) BY MOUTH AT BEDTIME. 12/12/16   Sherren MochaShaw, Murel Shenberger N, MD  omeprazole (PRILOSEC) 20 MG capsule TAKE 1 CAPSULE BY MOUTH EVERY DAY 07/19/17   Sherren MochaShaw, Scarlet Abad N, MD   Allergies  Allergen Reactions  . Amoxicillin Itching and Rash   Family History  Problem Relation Age of Onset  . Alcohol abuse Mother   . Depression Mother   . Anxiety disorder Mother   . Sleep apnea Mother   . Alcoholism Mother   . Diabetes Mother   . Alcohol abuse Father   . Alcoholism Father   . Alcohol abuse Brother   . Alcoholism Brother   . Multiple sclerosis Paternal Uncle    Social History   Socioeconomic History  . Marital status: Single    Spouse name: None  . Number of children: 1  . Years of education: some col.  . Highest education level: None  Social Needs  . Financial resource strain: None  . Food insecurity - worry: None  . Food insecurity - inability: None  . Transportation needs - medical: None  . Transportation needs - non-medical: None  Occupational History    Employer: [employer]    Comment: [employer]  Tobacco Use  . Smoking status: Former Smoker    Start date: 09/05/2001  . Smokeless tobacco: Never Used  . Tobacco comment: 10 years ago  Substance and Sexual Activity  . Alcohol use: Yes    Comment: occasionally,one 3-7 oz of wine per week  . Drug use: No  . Sexual activity: Not Currently    Birth control/protection: Pill    Comment: kariva  Other Topics Concern  . None  Social History Narrative   Patient is single and lives at home, her daughter lives with her.   Patient has one child.   Patient is working full-time.   Patient has some college education.   Patient is left handed.   Patient does not drink any caffeine.   [facility] 06/23/2017 04/03/2017 01/12/2017 12/12/2016 09/02/2016  Decreased Interest 0 0 0 0 0  Down, Depressed, Hopeless 0 0 0 0 0  PHQ - 2 Score 0 0 0 0 0    Review of Systems  Constitutional:  Negative for chills, fatigue, fever and unexpected weight change.  Respiratory: Negative for cough.   Gastrointestinal: Negative for constipation, diarrhea, nausea and vomiting.  Musculoskeletal: Positive for myalgias.  Skin: Negative for rash and wound.  Neurological: Negative for dizziness, weakness and headaches.       Objective:   Physical Exam  Constitutional: She is oriented to person, place, and time. She appears well-developed and well-nourished. No distress.  HENT:  Head: Normocephalic and atraumatic.  Eyes:  EOM are normal. Pupils are equal, round, and reactive to light.  Neck: Neck supple.  Cardiovascular: Normal rate.  Pulmonary/Chest: Effort normal. No respiratory distress.  Musculoskeletal: Normal range of motion.  Neurological: She is alert and oriented to person, place, and time.  Reflex Scores:      Patellar reflexes are 2+ on the right side and 2+ on the left side.      Achilles reflexes are 2+ on the right side and 2+ on the left side. Skin: Skin is warm and dry.  Psychiatric: She has a normal mood and affect. Her behavior is normal.  Nursing note and vitals reviewed.   BP 118/76   Pulse (!) 108   Temp (!) 97 F (36.1 C)   Resp 16   Ht 5' 1.42" (1.56 m)   Wt 239 lb 12.8 oz (108.8 kg)   SpO2 97%   BMI 44.69 kg/m       Assessment & Plan:   1. Class 3 severe obesity due to excess calories without serious comorbidity with body mass index (BMI) of 45.0 to 49.9 in adult [facility]) - pt is being very dedicated about exercise - I have actually worked out several times with her and so can vouch for this 4-5x/wk interval intensity circuit training. However, she continues to gain weight but is aware that it is her poor diet choices though have improved some.  Very sensitive to med side effects and has significant baseline anxiety so I am concerned phenteramine would worsen that. Try wellbutrin which she tolerated well but only helped w/ appetite suppression for first few  wks - if she tolerates it but notes the appetite suppression waning - can gradualy wean up to 200mg  or even 300mg  qam. If then becomes tolerant, try changing to IR version.  Consider trial of low dose naltrexone since she does identify the "comfort-food' problem (naltrexone 50mg  dissolved in 50mL of water, store in fridge, shake and take 5 mL qhs).  2. DDD (degenerative disc disease), lumbar - rec updating xray imaging, consider PT, consider MRI - pt will RTC for this at next visit.  Cont meloxicam 15mg  qam  3. Acute right-sided low back pain with right-sided sciatica   4. Bilateral anterior knee pain - rec standing xrays - pt will RTC for this at next OV - patellofemoral??  Consider sports med referral?  5.      Generalized anxiety d/o - doing much better on prozac 10. Cont  Orders Placed This Encounter  Procedures  . Hemoglobin A1c    Standing Status:   Future    Standing Expiration Date:   08/26/2018    Meds ordered this encounter  Medications  . buPROPion (WELLBUTRIN SR) 100 MG 12 hr tablet    Sig: Take 1 tablet (100 mg total) by mouth daily.    Dispense:  30 tablet    Refill:  1  . meloxicam (MOBIC) 15 MG tablet    Sig: Take 1 tablet (15 mg total) by mouth daily as needed for pain.    Dispense:  30 tablet    Refill:  4  . FLUoxetine (PROZAC) 10 MG tablet    Sig: Take 1 tablet (10 mg total) by mouth daily.    Dispense:  90 tablet    Refill:  3    I personally performed the services described in this documentation, which was scribed in my presence. The recorded information has been reviewed and considered, and addended by me as needed.  Norberto SorensonEva Fern Canova, M.D.  Primary Care at [facility]  [place] [address] [place], [place] [address] [phone] phone [phone] fax  09/03/17 1:40 AM

## 2017-09-03 DIAGNOSIS — F411 Generalized anxiety disorder: Secondary | ICD-10-CM | POA: Insufficient documentation

## 2017-09-03 DIAGNOSIS — E669 Obesity, unspecified: Secondary | ICD-10-CM | POA: Insufficient documentation

## 2017-10-18 ENCOUNTER — Other Ambulatory Visit: Payer: Self-pay | Admitting: Family Medicine

## 2017-10-18 DIAGNOSIS — K219 Gastro-esophageal reflux disease without esophagitis: Secondary | ICD-10-CM

## 2017-10-25 ENCOUNTER — Other Ambulatory Visit: Payer: Self-pay

## 2017-10-25 ENCOUNTER — Encounter: Payer: Self-pay | Admitting: Family Medicine

## 2017-10-25 ENCOUNTER — Ambulatory Visit: Payer: BLUE CROSS/BLUE SHIELD | Admitting: Family Medicine

## 2017-10-25 VITALS — BP 118/80 | HR 97 | Temp 97.8°F | Resp 16 | Ht 61.42 in | Wt 224.2 lb

## 2017-10-25 DIAGNOSIS — J020 Streptococcal pharyngitis: Secondary | ICD-10-CM | POA: Diagnosis not present

## 2017-10-25 DIAGNOSIS — J029 Acute pharyngitis, unspecified: Secondary | ICD-10-CM | POA: Diagnosis not present

## 2017-10-25 LAB — POCT RAPID STREP A (OFFICE): Rapid Strep A Screen: POSITIVE — AB

## 2017-10-25 MED ORDER — GUAIFENESIN ER 1200 MG PO TB12: 1.0000 | ORAL_TABLET | Freq: Two times a day (BID) | ORAL | 1 refills | Status: DC | PRN

## 2017-10-25 MED ORDER — AZITHROMYCIN 250 MG PO TABS: ORAL_TABLET | ORAL | 0 refills | Status: DC

## 2017-10-25 NOTE — Progress Notes (Signed)
Subjective:  This chart was scribed for Sherren Mocha, MD by Veverly Fells, at Primary Care at [facility].  This patient was seen in room 1 and the patient's care was started at 10:59 AM.   Chief Complaint  Patient presents with  . Sore Throat    started last night fever 99-101 last nigt, cough x 7 days      Patient ID: Meagan [name], female    DOB: [birth year], 42 y.o.   MRN: [id number]  HPI HPI Comments: Meagan [name] is a 42 y.o. female who presents to Primary Care at [facility] complaining of a sore throat onset last night with associated symptoms of a fever (99-101), non productive cough, rhinorrhea (clear mucous), some muscle aches and sinus pressure.  Patient had a cold last week and states that she did feel a little better for a couple days but her symptoms came back. Patient has been taking Advil.  She has lost close to 20 lbs and has been going to [employer].    Past Medical History:  Diagnosis Date  . Allergy    Seasonal  . Anxiety   . Complication of anesthesia    post -op nausea and vomiting at age 17  . Constipation, chronic   . Depression   . Family history of anesthesia complication    cousin woke-up during procedure  . GERD (gastroesophageal reflux disease)   . Narcolepsy   . Ruptured lumbar disc    L5  . Sleep apnea    wears CPAP nightly  . Sleep paralysis    at times  . 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 Meanwhile  Medications have become ineffective and patient gained 100 pounds, developed OSA, now on CPAP 9 cm, MSLT to follow in 04-19-13.   . Vivid dream     Current Outpatient Medications on File Prior to Visit  Medication Sig Dispense Refill  . ALPRAZolam (NIRAVAM) 0.5 MG dissolvable tablet Take 1 tablet (0.5 mg total) by mouth 2 (two) times daily as needed for anxiety. 20 tablet 0  . desogestrel-ethinyl estradiol (KARIVA,AZURETTE,MIRCETTE) 0.15-0.02/0.01 MG (21/5) tablet Take 1 tablet by mouth daily.    Marland Kitchen FLUoxetine  (PROZAC) 10 MG tablet Take 1 tablet (10 mg total) by mouth daily. 90 tablet 3  . fluticasone (FLONASE) 50 MCG/ACT nasal spray Place 2 sprays into both nostrils daily.    Marland Kitchen levothyroxine (SYNTHROID, LEVOTHROID) 50 MCG tablet Take 50 mcg by mouth daily.  1  . meloxicam (MOBIC) 15 MG tablet Take 1 tablet (15 mg total) by mouth daily as needed for pain. 30 tablet 4  . montelukast (SINGULAIR) 10 MG tablet TAKE 1 TABLET (10 MG TOTAL) BY MOUTH AT BEDTIME. 90 tablet 3  . Multiple Vitamins-Minerals (MULTIVITAMIN ADULT PO) multivitamin    . omeprazole (PRILOSEC) 20 MG capsule TAKE 1 CAPSULE BY MOUTH EVERY DAY 90 capsule 0  . buPROPion (WELLBUTRIN SR) 100 MG 12 hr tablet Take 1 tablet (100 mg total) by mouth daily. (Patient not taking: Reported on 10/25/2017) 30 tablet 1   No current facility-administered medications on file prior to visit.     Allergies  Allergen Reactions  . Amoxicillin Itching and Rash   Past Surgical History:  Procedure Laterality Date  . CHOLECYSTECTOMY N/A 01/01/2014   Procedure: LAPAROSCOPIC CHOLECYSTECTOMY WITH INTRAOPERATIVE CHOLANGIOGRAM;  Surgeon: Wilmon Arms. Corliss Skains, MD;  Location: [facility] OR;  Service: General;  Laterality: N/A;  . TONSILLECTOMY AND ADENOIDECTOMY  1981  age 89  . VAGINAL DELIVERY     Family History  Problem Relation Age of Onset  . Alcohol abuse Mother   . Depression Mother   . Anxiety disorder Mother   . Sleep apnea Mother   . Alcoholism Mother   . Diabetes Mother   . Alcohol abuse Father   . Alcoholism Father   . Alcohol abuse Brother   . Alcoholism Brother   . Multiple sclerosis Paternal Uncle    Social History   Socioeconomic History  . Marital status: Single    Spouse name: None  . Number of children: 1  . Years of education: some col.  . Highest education level: None  Social Needs  . Financial resource strain: None  . Food insecurity - worry: None  . Food insecurity - inability: None  . Transportation needs - medical: None  .  Transportation needs - non-medical: None  Occupational History    Employer: [employer]    Comment: [employer]  Tobacco Use  . Smoking status: Former Smoker    Start date: 09/05/2001  . Smokeless tobacco: Never Used  . Tobacco comment: 10 years ago  Substance and Sexual Activity  . Alcohol use: Yes    Comment: occasionally,one 3-7 oz of wine per week  . Drug use: No  . Sexual activity: Not Currently    Birth control/protection: Pill    Comment: kariva  Other Topics Concern  . None  Social History Narrative   Patient is single and lives at home, her daughter lives with her.   Patient has one child.   Patient is working full-time.   Patient has some college education.   Patient is left handed.   Patient does not drink any caffeine.   Dep[facility] 10/25/2017 06/23/2017 04/03/2017 01/12/2017 12/12/2016  Decreased Interest 0 0 0 0 0  Down, Depressed, Hopeless 0 0 0 0 0  PHQ - 2 Score 0 0 0 0 0     Review of Systems  Constitutional: Positive for fever.  HENT: Positive for rhinorrhea, sinus pressure and sore throat.   Eyes: Negative for pain, redness and itching.  Respiratory: Positive for cough. Negative for choking and shortness of breath.   Gastrointestinal: Negative for nausea and vomiting.  Musculoskeletal: Positive for myalgias. Negative for neck pain and neck stiffness.  Neurological: Negative for syncope and speech difficulty.       Objective:   Physical Exam  Constitutional: She is oriented to person, place, and time. She appears well-developed and well-nourished. No distress.  HENT:  Head: Normocephalic and atraumatic.  Left ear is normal, right TM is retracted, injected, erythema, mid ear effusion. Some erythema in the oropharynx.   Neck:  positive sub mandibular and tonsillar adenopathy , anterior adenopathy- left worse than right.   Cardiovascular: S1 normal and S2 normal.  Tachycardic but regular rhythm.   Pulmonary/Chest: Effort  normal and breath sounds normal. No respiratory distress. She has no wheezes.  Lungs clear, good air movement.   Neurological: She is alert and oriented to person, place, and time.  Psychiatric: She has a normal mood and affect. Her behavior is normal.    Vitals:   10/25/17 1051  BP: 118/80  Pulse: 97  Resp: 16  Temp: 97.8 F (36.6 C)  SpO2: 99%  Weight: 224 lb 3.2 oz (101.7 kg)  Height: 5' 1.42" (1.56 m)   Results for orders placed or performed in visit on 10/25/17  POCT rapid strep  A  Result Value Ref Range   Rapid Strep A Screen Positive (A) Negative       Assessment & Plan:   1. Streptococcal pharyngitis   2. Sore throat   PCN allergy.  Orders Placed This Encounter  Procedures  . POCT rapid strep A    Meds ordered this encounter  Medications  . azithromycin (ZITHROMAX) 250 MG tablet    Sig: Take 2 tabs PO x 1 dose, then 1 tab PO QD x 4 days    Dispense:  6 tablet    Refill:  0  . Guaifenesin (MUCINEX MAXIMUM STRENGTH) 1200 MG TB12    Sig: Take 1 tablet (1,200 mg total) by mouth every 12 (twelve) hours as needed.    Dispense:  14 tablet    Refill:  1    I personally performed the services described in this documentation, which was scribed in my presence. The recorded information has been reviewed and considered, and addended by me as needed.   Norberto Sorenson, M.D.  Primary Care at [facility] [address] [place], [place] [address] [phone] phone [phone] fax  10/28/17 2:11 AM

## 2017-10-25 NOTE — Patient Instructions (Addendum)
IF you received an x-ray today, you will receive an invoice from [facility]. Please contact [facility] at [phone] with questions or concerns regarding your invoice.   IF you received labwork today, you will receive an invoice from [facility]. Please contact [facility] at [phone] with questions or concerns regarding your invoice.   Our billing staff will not be able to assist you with questions regarding bills from these companies.  You will be contacted with the lab results as soon as they are available. The fastest way to get your results is to activate your My Chart account. Instructions are located on the last page of this paperwork. If you have not heard from [place] regarding the results in 2 weeks, please contact this office.      Strep Throat Strep throat is a bacterial infection of the throat. Your health care provider may call the infection tonsillitis or pharyngitis, depending on whether there is swelling in the tonsils or at the back of the throat. Strep throat is most common during the cold months of the year in children who are 74-80 years of age, but it can happen during any season in people of any age. This infection is spread from person to person (contagious) through coughing, sneezing, or close contact. What are the causes? Strep throat is caused by the bacteria called Streptococcus pyogenes. What increases the risk? This condition is more likely to develop in:  People who spend time in crowded places where the infection can spread easily.  People who have close contact with someone who has strep throat.  What are the signs or symptoms? Symptoms of this condition include:  Fever or chills.  Redness, swelling, or pain in the tonsils or throat.  Pain or difficulty when swallowing.  White or yellow spots on the tonsils or throat.  Swollen, tender glands in the neck or under the jaw.  Red rash all over the body (rare).  How is this  diagnosed? This condition is diagnosed by performing a rapid strep test or by taking a swab of your throat (throat culture test). Results from a rapid strep test are usually ready in a few minutes, but throat culture test results are available after one or two days. How is this treated? This condition is treated with antibiotic medicine. Follow these instructions at home: Medicines  Take over-the-counter and prescription medicines only as told by your health care provider.  Take your antibiotic as told by your health care provider. Do not stop taking the antibiotic even if you start to feel better.  Have family members who also have a sore throat or fever tested for strep throat. They may need antibiotics if they have the strep infection. Eating and drinking  Do not share food, drinking cups, or personal items that could cause the infection to spread to other people.  If swallowing is difficult, try eating soft foods until your sore throat feels better.  Drink enough fluid to keep your urine clear or pale yellow. General instructions  Gargle with a salt-water mixture 3-4 times per day or as needed. To make a salt-water mixture, completely dissolve -1 tsp of salt in 1 cup of warm water.  Make sure that all household members wash their hands well.  Get plenty of rest.  Stay home from school or work until you have been taking antibiotics for 24 hours.  Keep all follow-up visits as told by your health care provider. This is important. Contact a health care  provider if:  The glands in your neck continue to get bigger.  You develop a rash, cough, or earache.  You cough up a thick liquid that is green, yellow-brown, or bloody.  You have pain or discomfort that does not get better with medicine.  Your problems seem to be getting worse rather than better.  You have a fever. Get help right away if:  You have new symptoms, such as vomiting, severe headache, stiff or painful neck,  chest pain, or shortness of breath.  You have severe throat pain, drooling, or changes in your voice.  You have swelling of the neck, or the skin on the neck becomes red and tender.  You have signs of dehydration, such as fatigue, dry mouth, and decreased urination.  You become increasingly sleepy, or you cannot wake up completely.  Your joints become red or painful. This information is not intended to replace advice given to you by your health care provider. Make sure you discuss any questions you have with your health care provider. Document Released: 08/19/2000 Document Revised: 04/20/2016 Document Reviewed: 12/15/2014 Elsevier Interactive Patient Education  [employer]2018 Elsevier Inc.

## 2017-10-26 ENCOUNTER — Encounter: Payer: Self-pay | Admitting: Family Medicine

## 2017-10-27 ENCOUNTER — Encounter: Payer: Self-pay | Admitting: Family Medicine

## 2018-01-23 ENCOUNTER — Ambulatory Visit: Payer: BLUE CROSS/BLUE SHIELD | Admitting: Family Medicine

## 2018-01-30 ENCOUNTER — Encounter: Payer: Self-pay | Admitting: Family Medicine

## 2018-01-31 ENCOUNTER — Encounter: Payer: Self-pay | Admitting: Family Medicine

## 2018-01-31 DIAGNOSIS — L659 Nonscarring hair loss, unspecified: Secondary | ICD-10-CM

## 2018-01-31 DIAGNOSIS — E7800 Pure hypercholesterolemia, unspecified: Secondary | ICD-10-CM

## 2018-01-31 DIAGNOSIS — Z6841 Body Mass Index (BMI) 40.0 and over, adult: Secondary | ICD-10-CM

## 2018-01-31 DIAGNOSIS — E039 Hypothyroidism, unspecified: Secondary | ICD-10-CM

## 2018-02-07 DIAGNOSIS — M25511 Pain in right shoulder: Secondary | ICD-10-CM | POA: Diagnosis not present

## 2018-02-12 ENCOUNTER — Other Ambulatory Visit: Payer: Self-pay | Admitting: Family Medicine

## 2018-02-12 DIAGNOSIS — L718 Other rosacea: Secondary | ICD-10-CM | POA: Diagnosis not present

## 2018-02-12 DIAGNOSIS — B070 Plantar wart: Secondary | ICD-10-CM | POA: Diagnosis not present

## 2018-02-24 ENCOUNTER — Other Ambulatory Visit: Payer: Self-pay

## 2018-02-24 ENCOUNTER — Encounter: Payer: Self-pay | Admitting: Family Medicine

## 2018-02-24 ENCOUNTER — Ambulatory Visit: Payer: BLUE CROSS/BLUE SHIELD | Admitting: Family Medicine

## 2018-02-24 VITALS — BP 122/82 | HR 87 | Temp 98.7°F | Resp 16 | Ht 61.42 in | Wt 202.0 lb

## 2018-02-24 DIAGNOSIS — Z3009 Encounter for other general counseling and advice on contraception: Secondary | ICD-10-CM | POA: Diagnosis not present

## 2018-02-24 DIAGNOSIS — L659 Nonscarring hair loss, unspecified: Secondary | ICD-10-CM | POA: Diagnosis not present

## 2018-02-24 DIAGNOSIS — E7800 Pure hypercholesterolemia, unspecified: Secondary | ICD-10-CM | POA: Diagnosis not present

## 2018-02-24 DIAGNOSIS — E039 Hypothyroidism, unspecified: Secondary | ICD-10-CM | POA: Diagnosis not present

## 2018-02-24 DIAGNOSIS — M5126 Other intervertebral disc displacement, lumbar region: Secondary | ICD-10-CM

## 2018-02-24 DIAGNOSIS — Z79899 Other long term (current) drug therapy: Secondary | ICD-10-CM

## 2018-02-24 DIAGNOSIS — E669 Obesity, unspecified: Secondary | ICD-10-CM

## 2018-02-24 DIAGNOSIS — J302 Other seasonal allergic rhinitis: Secondary | ICD-10-CM

## 2018-02-24 DIAGNOSIS — F411 Generalized anxiety disorder: Secondary | ICD-10-CM | POA: Diagnosis not present

## 2018-02-24 DIAGNOSIS — M25562 Pain in left knee: Secondary | ICD-10-CM | POA: Diagnosis not present

## 2018-02-24 DIAGNOSIS — K219 Gastro-esophageal reflux disease without esophagitis: Secondary | ICD-10-CM | POA: Diagnosis not present

## 2018-02-24 DIAGNOSIS — Z6841 Body Mass Index (BMI) 40.0 and over, adult: Secondary | ICD-10-CM | POA: Diagnosis not present

## 2018-02-24 MED ORDER — MONTELUKAST SODIUM 10 MG PO TABS: ORAL_TABLET | ORAL | 3 refills | Status: DC

## 2018-02-24 MED ORDER — MELOXICAM 15 MG PO TABS: 15.0000 mg | ORAL_TABLET | Freq: Every day | ORAL | 3 refills | Status: DC | PRN

## 2018-02-24 NOTE — Patient Instructions (Signed)
     IF you received an x-ray today, you will receive an invoice from [facility]. Please contact [facility] at [phone] with questions or concerns regarding your invoice.   IF you received labwork today, you will receive an invoice from [facility]. Please contact [facility] at [phone] with questions or concerns regarding your invoice.   Our billing staff will not be able to assist you with questions regarding bills from these companies.  You will be contacted with the lab results as soon as they are available. The fastest way to get your results is to activate your My Chart account. Instructions are located on the last page of this paperwork. If you have not heard from us regarding the results in 2 weeks, please contact this office.     

## 2018-02-24 NOTE — Progress Notes (Signed)
Subjective:    Patient ID: Meagan [name], female    DOB: 1976-03-19, 42 y.o.   MRN: [id number]   Chief Complaint  Patient presents with  . Chronic Condition    6 month follow up     HPI   Obesity Niece came home from [place] so lots of eating and drinking and gained 5 lbs this week. - Had 3 margararitas last night !!! - celebration for niece - as forgot about appt today but is fasting now. Has been trying to eat a lot better.  eating well since March - made big changes in diet now that feeling better from exercise.  Chronic lumbago 2/2 h/o L5 ruptured disc/DDD meloxicam is good enough - needs every day for L5 DDD and ice for pain flairs.   Right shoulder rotator cuff strain   last month - no better though has been modifying exercise. Recently started taking joint supplement - glucosamine-chrondroitin.  Left knee pain Has a lot of popping and cracking in left knee internal lower aspect.  When she does treadmill inclines is going to give way and on stairs it hurts in the medial aspect.   Anxiety doing great on Prozac, exercise has helped IMMENSELY.  Menses OCP working working well - eleminate periods   GERD No longer needed the prilosec now that she is eating healthy - sugar seemed to be main cause of gerd from.   Hair loss Pulling out numbers of hair all throughout the day - first noticed when started thyroid meds but then Balan told her it was sugar related but sugar has been under control since January but getting worse. Also does have female pattern hair loss in family she has noted. Taking Biotin complex supplement.   Past Medical History:  Diagnosis Date  . Allergy    Seasonal  . Anxiety   . Complication of anesthesia    post -op nausea and vomiting at age 89  . Constipation, chronic   . Depression   . Family history of anesthesia complication    cousin woke-up during procedure  . GERD (gastroesophageal reflux disease)   . Narcolepsy   . Ruptured lumbar disc    L5   . Sleep apnea    wears CPAP nightly  . Sleep paralysis    at times  . 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 Meanwhile  Medications have become ineffective and patient gained 100 pounds, developed OSA, now on CPAP 9 cm, MSLT to follow in 04-19-13.   . Vivid dream    Past Surgical History:  Procedure Laterality Date  . CHOLECYSTECTOMY N/A 01/01/2014   Procedure: LAPAROSCOPIC CHOLECYSTECTOMY WITH INTRAOPERATIVE CHOLANGIOGRAM;  Surgeon: Wilmon Arms. Corliss Skains, MD;  Location: [facility] OR;  Service: General;  Laterality: N/A;  . TONSILLECTOMY AND ADENOIDECTOMY  1981   age 71  . VAGINAL DELIVERY     Current Outpatient Medications on File Prior to Visit  Medication Sig Dispense Refill  . ALPRAZolam (NIRAVAM) 0.5 MG dissolvable tablet Take 1 tablet (0.5 mg total) by mouth 2 (two) times daily as needed for anxiety. 20 tablet 0  . desogestrel-ethinyl estradiol (KARIVA,AZURETTE,MIRCETTE) 0.15-0.02/0.01 MG (21/5) tablet Take 1 tablet by mouth daily.    Marland Kitchen FLUoxetine (PROZAC) 10 MG tablet Take 1 tablet (10 mg total) by mouth daily. 90 tablet 3  . fluticasone (FLONASE) 50 MCG/ACT nasal spray Place 2 sprays into both nostrils daily.    . Multiple Vitamins-Minerals (MULTIVITAMIN ADULT PO) multivitamin    .  omeprazole (PRILOSEC) 20 MG capsule TAKE 1 CAPSULE BY MOUTH EVERY DAY 90 capsule 0   No current facility-administered medications on file prior to visit.    Allergies  Allergen Reactions  . Amoxicillin Itching and Rash   Family History  Problem Relation Age of Onset  . Alcohol abuse Mother   . Depression Mother   . Anxiety disorder Mother   . Sleep apnea Mother   . Alcoholism Mother   . Diabetes Mother   . Alcohol abuse Father   . Alcoholism Father   . Alcohol abuse Brother   . Alcoholism Brother   . Multiple sclerosis Paternal Uncle    Social History   Socioeconomic History  . Marital status: Single    Spouse name: Not on file  . Number of children: 1  .  Years of education: some col.  . Highest education level: Not on file  Occupational History    Employer: [employer]    Comment: [employer]  Social Needs  . Financial resource strain: Not on file  . Food insecurity:    Worry: Not on file    Inability: Not on file  . Transportation needs:    Medical: Not on file    Non-medical: Not on file  Tobacco Use  . Smoking status: Former Smoker    Start date: 09/05/2001  . Smokeless tobacco: Never Used  . Tobacco comment: 10 years ago  Substance and Sexual Activity  . Alcohol use: Yes    Comment: occasionally,one 3-7 oz of wine per week  . Drug use: No  . Sexual activity: Not Currently    Birth control/protection: Pill    Comment: kariva  Lifestyle  . Physical activity:    Days per week: Not on file    Minutes per session: Not on file  . Stress: Not on file  Relationships  . Social connections:    Talks on phone: Not on file    Gets together: Not on file    Attends religious service: Not on file    Active member of club or organization: Not on file    Attends meetings of clubs or organizations: Not on file    Relationship status: Not on file  Other Topics Concern  . Not on file  Social History Narrative   Patient is single and lives at home, her daughter lives with her.   Patient has one child.   Patient is working full-time.   Patient has some college education.   Patient is left handed.   Patient does not drink any caffeine.   Depression screen [facility] 2/9 02/24/2018 10/25/2017 06/23/2017 04/03/2017 01/12/2017  Decreased Interest 0 0 0 0 0  Down, Depressed, Hopeless 0 0 0 0 0  PHQ - 2 Score 0 0 0 0 0     Review of Systems  Constitutional: Negative for activity change, appetite change, chills, diaphoresis, fatigue, fever and unexpected weight change.  HENT: Negative for trouble swallowing.   Cardiovascular: Negative for palpitations.  Gastrointestinal: Negative for constipation and diarrhea.  Genitourinary:  Negative for decreased urine volume and frequency.  Musculoskeletal: Positive for arthralgias (left knee audible crepitus and popping) and back pain. Negative for gait problem and myalgias.  Skin: Negative for color change, pallor and rash.  Neurological: Negative for tremors, syncope, weakness and numbness.  Hematological: Negative for adenopathy. Does not bruise/bleed easily.  Psychiatric/Behavioral: Negative for decreased concentration, dysphoric mood and sleep disturbance. The patient is not nervous/anxious.  Objective:   Physical Exam  Constitutional: She is oriented to person, place, and time. She appears well-developed and well-nourished. No distress.  HENT:  Head: Normocephalic and atraumatic.  Right Ear: External ear normal.  Left Ear: External ear normal.  Eyes: Conjunctivae are normal. No scleral icterus.  Neck: Normal range of motion. Neck supple. No thyromegaly present.  Cardiovascular: Normal rate, regular rhythm, normal heart sounds and intact distal pulses.  Pulmonary/Chest: Effort normal and breath sounds normal. No respiratory distress.  Musculoskeletal: She exhibits no edema.       Right knee: She exhibits abnormal patellar mobility (decreased patellar mid and lateral mobility w/ palpation, no pain). She exhibits normal range of motion, no swelling, no effusion, no ecchymosis, no erythema, normal alignment, no LCL laxity, no bony tenderness, normal meniscus and no MCL laxity. No tenderness found.       Left knee: She exhibits abnormal patellar mobility (very lax with moderate to large degree of both mid and lateral patellar mobility w/o pain). She exhibits normal range of motion, no swelling, no effusion, no erythema, normal alignment, no LCL laxity, no bony tenderness and no MCL laxity. No tenderness found.  Lymphadenopathy:    She has no cervical adenopathy.  Neurological: She is alert and oriented to person, place, and time.  Skin: Skin is warm and dry. She is  not diaphoretic. No erythema.  Scalp normal. Neg tug test. No discrete or focal areas of loss. No torn hairs nor new follicular growth - exclamation hairs - seen.   Psychiatric: She has a normal mood and affect. Her behavior is normal.    Left patellar laxity, no pain  BP 122/82   Pulse 87   Temp 98.7 F (37.1 C) (Oral)   Resp 16   Ht 5' 1.42" (1.56 m)   Wt 202 lb (91.6 kg)   SpO2 98%   BMI 37.65 kg/m      Assessment & Plan:   1. Hair loss - If labs all normal - do 1-2 mo trial off of biotin and yhwith iron and zinc other supp that I rec her and could consider more hormonal testing.  Do basic labs to r/o autoimmune or other def, thyroid abnml.   ADDENDUM: suspect low iron - start iron supp. Also high risk of female pattern hair loss due to +FHx and most notable in temples but feels that this is still out of proportion to that.  2. Obesity (BMI 35.0-39.9 without comorbidity) - lost 22 lbs in past 4 mos and 38 lbs in past 6 mos!!!!!  3. Pure hypercholesterolemia   4. Acquired hypothyroidism - on biotin which could potentially throw off tsh results but as on low dose levothyroxine and found other cause of hair loss w/ iron deficiency I don't think there is need to do trial off of biotin to recheck thyroid at this time and has seen Dr. Talmage Nap prior for this as well which she could cons rechecking w/ if sxs cont   5. Left medial knee pain - odd that left patella is SO much more lax than right Rt but not sure of sig and pain not reproducible on exam so will refer to [facility] w/ Dr. Darrick Penna and residents for further eval.  Is doing amazing high-intensity cross-training at [facility] so suspect strain -  as pt does not want to step back from her impressive new dedication to interval training of 15-20 min each of rowing, treadmill, & bodyweight exercises ~  5x/wk which has lead to AMAZING weight loss - so try biking to strengthen quad instead of treadmill    6. Encounter  for long-term (current) use of high-risk medication   7.      Generalized anxiety disorder - very well controlled w/ TLC so not used xanax at all but ok to refill if requests in next 6 mos. Cont prozac 10 - ok to refill x 6 mos if requested in Dec 8.      GERD - resolved now dec sugar in diet so no longer needing prilosec but ok to refill prn 9.      Family planning - ok to refill OCP x 1 yr if requested  Orders Placed This Encounter  Procedures  . Ambulatory referral to Sports Medicine    Referral Priority:   Routine    Referral Type:   Consultation    Number of Visits Requested:   1    Meds ordered this encounter  Medications  . meloxicam (MOBIC) 15 MG tablet    Sig: Take 1 tablet (15 mg total) by mouth daily as needed for pain.    Dispense:  90 tablet    Refill:  3  . montelukast (SINGULAIR) 10 MG tablet    Sig: TAKE 1 TABLET (10 MG TOTAL) BY MOUTH AT BEDTIME.    Dispense:  90 tablet    Refill:  3  . levothyroxine (SYNTHROID, LEVOTHROID) 50 MCG tablet    Sig: Take 1 tablet (50 mcg total) by mouth daily.    Dispense:  90 tablet    Refill:  3    I personally performed the services described in this documentation, which was scribed in my presence. The recorded information has been reviewed and considered, and addended by me as needed.   Norberto SorensonEva Prajwal Fellner, M.D.  Primary Care at [facility]  [facility] [address] [place], [place] [address] [phone] phone [phone] fax  03/01/18 11:25 PM

## 2018-02-25 LAB — COMPREHENSIVE METABOLIC PANEL
A/G RATIO: 1.8 (ref 1.2–2.2)
ALK PHOS: 56 IU/L (ref 39–117)
ALT: 11 IU/L (ref 0–32)
AST: 10 IU/L (ref 0–40)
Albumin: 4.2 g/dL (ref 3.5–5.5)
BUN/Creatinine Ratio: 13 (ref 9–23)
BUN: 12 mg/dL (ref 6–24)
CHLORIDE: 108 mmol/L — AB (ref 96–106)
CO2: 18 mmol/L — ABNORMAL LOW (ref 20–29)
Calcium: 8.9 mg/dL (ref 8.7–10.2)
Creatinine, Ser: 0.92 mg/dL (ref 0.57–1.00)
GFR calc Af Amer: 88 mL/min/{1.73_m2} (ref >59)
GFR calc non Af Amer: 77 mL/min/{1.73_m2} (ref >59)
GLUCOSE: 99 mg/dL (ref 65–99)
Globulin, Total: 2.3 g/dL (ref 1.5–4.5)
POTASSIUM: 4.7 mmol/L (ref 3.5–5.2)
Sodium: 138 mmol/L (ref 134–144)
Total Protein: 6.5 g/dL (ref 6.0–8.5)

## 2018-02-25 LAB — IRON,TIBC AND FERRITIN PANEL
Ferritin: 33 ng/mL (ref 15–150)
IRON SATURATION: 14 % — AB (ref 15–55)
IRON: 42 ug/dL (ref 27–159)
TIBC: 304 ug/dL (ref 250–450)
UIBC: 262 ug/dL (ref 131–425)

## 2018-02-25 LAB — HEMOGLOBIN A1C
ESTIMATED AVERAGE GLUCOSE: 108 mg/dL
Hgb A1c MFr Bld: 5.4 % (ref 4.8–5.6)

## 2018-02-25 LAB — CBC WITH DIFFERENTIAL/PLATELET
Basophils Absolute: 0 10*3/uL (ref 0.0–0.2)
Basos: 0 %
EOS (ABSOLUTE): 0.2 10*3/uL (ref 0.0–0.4)
EOS: 2 %
HEMATOCRIT: 37.8 % (ref 34.0–46.6)
Hemoglobin: 12.4 g/dL (ref 11.1–15.9)
IMMATURE GRANS (ABS): 0 10*3/uL (ref 0.0–0.1)
IMMATURE GRANULOCYTES: 0 %
Lymphocytes Absolute: 2.5 10*3/uL (ref 0.7–3.1)
Lymphs: 26 %
MCH: 28.2 pg (ref 26.6–33.0)
MCHC: 32.8 g/dL (ref 31.5–35.7)
MCV: 86 fL (ref 79–97)
Monocytes Absolute: 0.8 10*3/uL (ref 0.1–0.9)
Monocytes: 8 %
NEUTROS PCT: 64 %
Neutrophils Absolute: 6.2 10*3/uL (ref 1.4–7.0)
Platelets: 396 10*3/uL (ref 150–450)
RBC: 4.39 x10E6/uL (ref 3.77–5.28)
RDW: 13.6 % (ref 12.3–15.4)
WBC: 9.8 10*3/uL (ref 3.4–10.8)

## 2018-02-25 LAB — TSH+T4F+T3FREE
FREE T4: 1.54 ng/dL (ref 0.82–1.77)
T3 FREE: 3 pg/mL (ref 2.0–4.4)
TSH: 2.86 u[IU]/mL (ref 0.450–4.500)

## 2018-02-25 LAB — LIPID PANEL
CHOL/HDL RATIO: 2.4 ratio (ref 0.0–4.4)
Cholesterol, Total: 208 mg/dL — ABNORMAL HIGH (ref 100–199)
HDL: 88 mg/dL (ref >39)
LDL CALC: 108 mg/dL — AB (ref 0–99)
TRIGLYCERIDES: 62 mg/dL (ref 0–149)
VLDL CHOLESTEROL CAL: 12 mg/dL (ref 5–40)

## 2018-02-25 LAB — RPR: RPR: NONREACTIVE

## 2018-02-25 LAB — SEDIMENTATION RATE: Sed Rate: 14 mm/hr (ref 0–32)

## 2018-02-25 LAB — C-REACTIVE PROTEIN: CRP: 12 mg/L — ABNORMAL HIGH (ref 0–10)

## 2018-03-01 ENCOUNTER — Encounter: Payer: Self-pay | Admitting: Family Medicine

## 2018-03-01 MED ORDER — LEVOTHYROXINE SODIUM 50 MCG PO TABS: 50.0000 ug | ORAL_TABLET | Freq: Every day | ORAL | 3 refills | Status: DC

## 2018-03-01 NOTE — Telephone Encounter (Signed)
Message to Dr. Clelia CroftShaw

## 2018-03-02 ENCOUNTER — Encounter: Payer: Self-pay | Admitting: Family Medicine

## 2018-03-02 DIAGNOSIS — J302 Other seasonal allergic rhinitis: Secondary | ICD-10-CM | POA: Insufficient documentation

## 2018-03-02 DIAGNOSIS — M5126 Other intervertebral disc displacement, lumbar region: Secondary | ICD-10-CM | POA: Insufficient documentation

## 2018-03-02 DIAGNOSIS — E039 Hypothyroidism, unspecified: Secondary | ICD-10-CM

## 2018-03-02 DIAGNOSIS — K219 Gastro-esophageal reflux disease without esophagitis: Secondary | ICD-10-CM | POA: Insufficient documentation

## 2018-03-02 HISTORY — DX: Hypothyroidism, unspecified: E03.9

## 2018-03-02 NOTE — Progress Notes (Signed)
Faxed and got confirmation

## 2018-03-14 ENCOUNTER — Ambulatory Visit: Payer: BLUE CROSS/BLUE SHIELD | Admitting: Family Medicine

## 2018-03-14 VITALS — BP 122/88 | Ht 60.0 in | Wt 198.0 lb

## 2018-03-14 DIAGNOSIS — M25562 Pain in left knee: Secondary | ICD-10-CM | POA: Diagnosis not present

## 2018-03-14 DIAGNOSIS — G8929 Other chronic pain: Secondary | ICD-10-CM | POA: Diagnosis not present

## 2018-03-14 DIAGNOSIS — M25561 Pain in right knee: Secondary | ICD-10-CM | POA: Diagnosis not present

## 2018-03-14 NOTE — Patient Instructions (Signed)
Your pain is due to mild arthritis. These are the different medications you can take for this: Tylenol 500mg  1-2 tabs three times a day for pain. Capsaicin, aspercreme, or biofreeze topically up to four times a day may also help with pain. Some supplements that may help for arthritis: Boswellia extract, curcumin, pycnogenol Aleve 1-2 tabs twice a day with food Cortisone injections are an option if pain is severe. If cortisone injections do not help, there are different types of shots that may help but they take longer to take effect. It's important that you continue to stay active. Straight leg raises, knee extensions, hamstring curls, hamstring swings 3 sets of 10 once a day (add ankle weight if these become too easy). Consider physical therapy to strengthen muscles around the joint that hurts to take pressure off of the joint itself. Shoe inserts with good arch support may be helpful. Heat or ice 15 minutes at a time 3-4 times a day as needed to help with pain. Water aerobics and cycling with low resistance are the best two types of exercise for arthritis though any exercise is ok as long as it doesn't worsen the pain. Follow up with me as needed.

## 2018-03-14 NOTE — Progress Notes (Signed)
Meagan [name] is a 42 y.o. female who is here for bilateral knee pain.     HPI:  Meagan [name] is a [age]yr old female with hx of obesity, allergic rhinitis, GERD, and anxiety who comes to sports medicine for evaluation of bilateral knee pain x 6-8 months. She is in no pain currently. Reports her left knee becomes painful with certain movements, but that her right pain usually just "feels weak" rather than painful. Symptoms are intermittent because she tries to avoid exacerbating movements. Both knees will hurt on medial side when walking on an incline on the treadmill or sitting cross-legged. Knees feel like "they wants to buckle" when on the treadmill, though no episode of buckling or locking. Feels "crackling like Rice Krispies" in both knees frequently. No swelling or joint stiffness.  She injured her left knee in October while doing lateral bench stepovers [facility], with acute medial knee pain but no swelling, but thought this pain had completely resolved after one month of avoiding certain gym activities. No other hx of knee or ankle injuries. Has intermittent bilateral hip "aching" and reports L5 bulging disc. No foot pain.  Her goals are to continue exercising, and she would like to return to running. [facility] and has lost 50lbs. Her mom has "terrible knees" with arthritis and her grandmother has had knee replacements. She doesn't want to "cause more damage" by continuing to exercise.   Review of systems:  As stated above   Interval past medical history, surgical history, family history, and social history obtained and reviewed.  Patient Active Problem List   Diagnosis Date Noted  . Gastroesophageal reflux disease 03/02/2018  . Seasonal allergic rhinitis 03/02/2018  . Acquired hypothyroidism 03/02/2018  . RLD (ruptured lumbar disc) 03/02/2018  . Generalized anxiety disorder 09/03/2017  . Obesity 09/03/2017  . OSA on CPAP 02/09/2015  .  Uncontrolled narcolepsy 04/03/2013  . Hypersomnia, persistent 01/11/2013    Physical Exam:  BP 122/88   Ht 5' (1.524 m)   Wt 198 lb (89.8 kg)   BMI 38.67 kg/m    Physical Exam Physical exam: Vital signs are reviewed and are documented in the chart Gen.: Alert, oriented, appears stated age, in no apparent distress HEENT: Moist oral mucosa Respiratory: Normal respirations, able to speak in full sentences Cardiac: Regular rate, distal pulses 2+ Integumentary: No rashes on visible skin:  Neurologic: Strength 5/5 with bilateral knee flexion and extension, as well as left ankle dorsiflexion, plantar flexion, inversion, eversion. Sensation intact in bilateral lower extremities  Psych: Normal affect, mood is described as good Musculoskeletal: Right knee:  Inspection is unremarkable. No noted warmth, erythema, effusion, or ecchymoses. Tender to palpation on medial joint line. Otherwise knee is nontender, including no lateral joint line or patellar tendon tenderness. No patellar hypermobility. +Crepitus and patellar grinding sensation. Full flexion and extension without pain. No signs of ligamentous instability with negative Lachman, anterior/posterior drawer, and lever testes. No pain or instability with valgus and varus stress testing. Negative Thessaly's. Poor quad development.  Left knee:  Inspection is unremarkable. No noted warmth, erythema, effusion, or ecchymoses. Tender to palpation on medial joint line. Otherwise knee is nontender, including no lateral joint line or patellar tendon tenderness. No patellar hypermobility. +Crepitus and patellar grinding sensation. Full flexion and extension without pain. No signs of ligamentous instability with negative Lachman, anterior/posterior drawer, and lever testes. No pain or instability with valgus and varus stress testing. Negative  Thessaly's. Poor quad development.  Feet: Normal arch. No excessive pronation with walking. Normal gait.  Ultrasound  L knee: No significant effusion. Joint space narrowing and osteophytes in medial compartment.  Assessment/Plan: 42yr old female with obesity, allergic rhinitis, GERD, and OSA presents with bilateral knee pain and subjective instability x 6-77months without preceding injury. Exam is significant for bilateral knee crepitus and medial joint line tenderness but with no signs of ligamentous instability or effusions. No patellar hypermobility or concern for patellar subluxation. Ultrasound shows signs of arthritis in the medial compartment which is the likely cause of her current pain. Cannot completely rule out meniscal damage, but less likely with negative Thessaly's. No additional imaging is required today. No injection today - will try exercise modification and OTC medications first.  1) Bilateral knee pain, secondary to arthritis -encouraged elliptical, bike, and walking -discussed progression from walk to jog training -avoid painful activities -recommended leg strengthening (straight leg raises, leg extensions, hamstring curls and swings) -recommend supportive shoes -tylenol PRN; discussed topical options  Follow up PRN for new or worsening symptoms.   Annell Greening, MD, MS [facility] Primary Care Pediatrics PGY3

## 2018-03-17 ENCOUNTER — Encounter: Payer: Self-pay | Admitting: Family Medicine

## 2018-03-17 DIAGNOSIS — M25561 Pain in right knee: Secondary | ICD-10-CM | POA: Insufficient documentation

## 2018-03-17 DIAGNOSIS — M25562 Pain in left knee: Secondary | ICD-10-CM | POA: Insufficient documentation

## 2018-03-17 NOTE — Assessment & Plan Note (Signed)
secondary to arthritis -encouraged elliptical, bike, and walking -discussed progression from walk to jog training -avoid painful activities -recommended leg strengthening (straight leg raises, leg extensions, hamstring curls and swings) -recommend supportive shoes -tylenol PRN; discussed topical options

## 2018-04-06 ENCOUNTER — Ambulatory Visit: Payer: BLUE CROSS/BLUE SHIELD | Admitting: Family Medicine

## 2018-04-17 DIAGNOSIS — M25511 Pain in right shoulder: Secondary | ICD-10-CM | POA: Diagnosis not present

## 2018-04-18 ENCOUNTER — Other Ambulatory Visit: Payer: Self-pay | Admitting: Orthopedic Surgery

## 2018-04-18 DIAGNOSIS — G8929 Other chronic pain: Secondary | ICD-10-CM

## 2018-04-18 DIAGNOSIS — M25511 Pain in right shoulder: Principal | ICD-10-CM

## 2018-04-30 ENCOUNTER — Inpatient Hospital Stay
Admission: RE | Admit: 2018-04-30 | Discharge: 2018-04-30 | Disposition: A | Discharge status: Home / Self Care | Payer: BLUE CROSS/BLUE SHIELD | Source: Ambulatory Visit | Attending: Orthopedic Surgery | Admitting: Orthopedic Surgery

## 2018-04-30 ENCOUNTER — Other Ambulatory Visit: Payer: BLUE CROSS/BLUE SHIELD

## 2018-05-21 ENCOUNTER — Encounter: Payer: Self-pay | Admitting: Neurology

## 2018-05-21 ENCOUNTER — Ambulatory Visit: Payer: BLUE CROSS/BLUE SHIELD | Admitting: Neurology

## 2018-05-21 VITALS — BP 124/86 | HR 84 | Ht 60.0 in | Wt 196.0 lb

## 2018-05-21 DIAGNOSIS — M25561 Pain in right knee: Secondary | ICD-10-CM | POA: Diagnosis not present

## 2018-05-21 DIAGNOSIS — G4733 Obstructive sleep apnea (adult) (pediatric): Secondary | ICD-10-CM

## 2018-05-21 DIAGNOSIS — Z9989 Dependence on other enabling machines and devices: Secondary | ICD-10-CM

## 2018-05-21 DIAGNOSIS — G4710 Hypersomnia, unspecified: Secondary | ICD-10-CM | POA: Diagnosis not present

## 2018-05-21 NOTE — Progress Notes (Signed)
PATIENT: Meagan [name] DOB: [birth year]  REASON FOR VISIT: follow up HISTORY FROM: patient alone   HISTORY OF PRESENT ILLNESS:  Today is 52 September 2019 and I have the pleasure of meeting Meagan [name], and minimum 42 year old Caucasian right-handed female patient who has been diagnosed with sleep disorders since 2002.  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 Baird Lyons.  The patient changed to [facility] in 2013-2014 and was here diagnosed with obstructive sleep apnea.  HLA test for narcolepsy earlier was negative, but she did have some persistent hypersomnia until she changed her sleep habits significantly.  Since then she has no longer even needed Adderall.  She has become a highly compliant CPAP user with download dated for today shows 87% compliance also 3 days she was not able to use the machine for 4 hours or more.  CPAP is set at 9 cmH2O pressure was 2 cm EPR the average daily usage time is 5 hours 17 minutes, the residual AHI is 0.8.  She has minimal air leaks and no treatment emergent central apneas are noted.  The machine is now 42 years old and actually due to be replaced.  She endorsed the fatigue severity score at 14 points, the Epworth sleepiness score at 4 out of 24 possible points. I would like for the patient to keep her old machine as a reserve of the change her to a auto titration device. I have the previous sleep study available.     I had the pleasure of seeing Meagan [name] today, on 05/18/2017. Her last visit was on 02/09/2015. She was just told by her durable medical equipment company, namely [employer], that she needed a revisit. The patient has been a compliant CPAP user her compliance is 100% with an average of 7 hours 16 minutes, CPAP pressure is set at 9 cm water with 27 m EPR, residual AHI is 0.5 she does not have significant air leaks her Epworth sleepiness score is low at 2 points fatigue  severity is low at 21 points, she just needs new supplies.  Meagan [name] is a 42 year old female with history of obstructive sleep Apnea. She returns today for a 90 day compliance download.,  the patient used to machine 100% of the last 30 days and 97% of the 4 hour requirement. Averages 6 hours and 19 minutes the machine is set at 9 cm water pressure was 2 cm EPR her residual AHI is 0.9, this is an excellent result. Airleak is mild-to-moderate. She has started to change the interface more often and this seems to help with the air leakage that was reported before. She can only drive for about 20 minutes at a time due to falling asleep. She states drinking caffeine does not work. We discuss an  MLST- she couldn't sleep  wearing the mask , I will re order the test without CPAP in daytime. She states that she has tried various medications and has the best success with Adderall XR and i will refill the medication.  Her BP remains in the 951 systolic, she is asked not to use caffeine along site the Adderall.  We we address her compliant of pressure marks from her nasal mask.  I will write for a respironics dream wear mask.   HISTORY 04/03/13 (CD):narcolepsy / hypersomnia :  The patient had been diagnosed in 2002 by Dr. Baird Lyons. She reported taking Provigil but had no response  the original diagnosis seems to have been made in 2002. She tried Ritalin after that again without a positive response started on Adderall which helped significantly but she hasn't needed increasing doses of the medication. At the time being that she was taking 40 mrem of Adderall XR divided into doses. Problem with her insurance coverage became evident as they did not want to cover the medicines, and considered the dose of Adderall exceedingly too high for daytime use.  The patient also weaned off Zoloft in preparation for an MS LT. As the discussed at our initial visit I was suspicious that there may be an additional sleep  disorder and form of obstructive sleep apnea as the patient had gained over 100 pollens in the period between her to sleep studies now. Indeed she tested positive for obstructive sleep apnea and was placed on CPAP.   The study was read on 02/18/2013 her baseline AHI was 28.4, her RDI is 36.7 in supine her AHI reached 60 and during REM 23.5. She also had frequent limb movements some of this lead to arousal and fragmentation of sleep the index here was 3.8 PLM related arousals. Since being off the Zoloft, her sleep has changed. She noticed having more emotional responses , and she likes this.  Her sleep time and pattern is not changed, only more dreams are noted.  The patient had been told by her mother that as a child she was a restless, kicking sleeper. The knee today 14 days after CPAP initiation to look at the compliance the AHI has been significantly reduced to 1.4 apneas per hour of sleep her CPAP is set at 9 cm water pressure visit centimeter EPR but her average daily usage at this time is only 2 hours and 17 minutes. There is an up going trend noted in her user time and ankle which the patient now that she has received another month to continue using the CPAP for at least 4 hours at night.  As the her initial visit the patient endorses a high Epworth sleepiness score of that CPAP may have not had time yet to kick in. Today's Epworth score is 19 points and her fatigue severity score is 57 points the patient also has a symptom report of visit ringings sleep paralysis, intrusion of dreams, sleep hallucinations and narcolepsy   REVIEW OF SYSTEMS: Full 14 system review of systems performed and notable only for:  Constitutional: fatigue, body aches, obesity.  Eyes: eye itching Ear/Nose/Throat: ringing in ears  Sleep: restless leg, insomnia, apnea, frequent waking, daytime sleepiness, snoring   ALLERGIES: Allergies  Allergen Reactions  . Amoxicillin Itching and Rash    HOME  MEDICATIONS: Outpatient Medications Prior to Visit  Medication Sig Dispense Refill  . desogestrel-ethinyl estradiol (KARIVA,AZURETTE,MIRCETTE) 0.15-0.02/0.01 MG (21/5) tablet Take 1 tablet by mouth daily.    Marland Kitchen FLUoxetine (PROZAC) 10 MG tablet Take 1 tablet (10 mg total) by mouth daily. 90 tablet 3  . levothyroxine (SYNTHROID, LEVOTHROID) 50 MCG tablet Take 1 tablet (50 mcg total) by mouth daily. 90 tablet 3  . meloxicam (MOBIC) 15 MG tablet Take 1 tablet (15 mg total) by mouth daily as needed for pain. 90 tablet 3  . montelukast (SINGULAIR) 10 MG tablet TAKE 1 TABLET (10 MG TOTAL) BY MOUTH AT BEDTIME. 90 tablet 3  . Multiple Vitamins-Minerals (MULTIVITAMIN ADULT PO) multivitamin    . omeprazole (PRILOSEC) 20 MG capsule TAKE 1 CAPSULE BY MOUTH EVERY DAY (Patient taking differently: Take 20 mg by mouth daily  as needed. ) 90 capsule 0  . ALPRAZolam (NIRAVAM) 0.5 MG dissolvable tablet Take 1 tablet (0.5 mg total) by mouth 2 (two) times daily as needed for anxiety. 20 tablet 0  . fluticasone (FLONASE) 50 MCG/ACT nasal spray Place 2 sprays into both nostrils daily.     No facility-administered medications prior to visit.     PAST MEDICAL HISTORY: Past Medical History:  Diagnosis Date  . Acquired hypothyroidism 03/02/2018  . Allergy    Seasonal  . Anxiety   . Complication of anesthesia    post -op nausea and vomiting at age 71  . Constipation, chronic   . Depression   . Family history of anesthesia complication    cousin woke-up during procedure  . GERD (gastroesophageal reflux disease)   . Narcolepsy   . Ruptured lumbar disc    L5  . Sleep apnea    wears CPAP nightly  . Sleep paralysis    at times  . 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 Meanwhile  Medications have become ineffective and patient gained 100 pounds, developed OSA, now on CPAP 9 cm, MSLT to follow in 04-19-13.   . Vivid dream     PAST SURGICAL HISTORY: Past Surgical History:   Procedure Laterality Date  . CHOLECYSTECTOMY N/A 01/01/2014   Procedure: LAPAROSCOPIC CHOLECYSTECTOMY WITH INTRAOPERATIVE CHOLANGIOGRAM;  Surgeon: Imogene Burn. Georgette Dover, MD;  Location: [facility];  Service: General;  Laterality: N/A;  . TONSILLECTOMY AND ADENOIDECTOMY  1981   age 72  . VAGINAL DELIVERY      FAMILY HISTORY: Family History  Problem Relation Age of Onset  . Alcohol abuse Mother   . Depression Mother   . Anxiety disorder Mother   . Sleep apnea Mother   . Alcoholism Mother   . Diabetes Mother   . Alcohol abuse Father   . Alcoholism Father   . Alcohol abuse Brother   . Alcoholism Brother   . Multiple sclerosis Paternal Uncle     SOCIAL HISTORY: Social History   Socioeconomic History  . Marital status: Single    Spouse name: Not on file  . Number of children: 1  . Years of education: some col.  . Highest education level: Not on file  Occupational History    Employer: [employer]  Social Needs  . Financial resource strain: Not on file  . Food insecurity:    Worry: Not on file    Inability: Not on file  . Transportation needs:    Medical: Not on file    Non-medical: Not on file  Tobacco Use  . Smoking status: Former Smoker    Start date: 09/05/2001  . Smokeless tobacco: Never Used  . Tobacco comment: 10 years ago  Substance and Sexual Activity  . Alcohol use: Yes    Comment: occasionally,one 3-7 oz of wine per week  . Drug use: No  . Sexual activity: Not Currently    Birth control/protection: Pill    Comment: kariva  Lifestyle  . Physical activity:    Days per week: Not on file    Minutes per session: Not on file  . Stress: Not on file  Relationships  . Social connections:    Talks on phone: Not on file    Gets together: Not on file    Attends religious service: Not on file    Active member of club or organization: Not on file  Attends meetings of clubs or organizations: Not on file    Relationship status: Not  on file  . Intimate partner violence:    Fear of current or ex partner: Not on file    Emotionally abused: Not on file    Physically abused: Not on file    Forced sexual activity: Not on file  Other Topics Concern  . Not on file  Social History Narrative   Patient is single and lives at home, her daughter lives with her.   Patient has one child.   Patient is working full-time.   Patient has some college education.   Patient is left handed.   Patient does not drink any caffeine.      PHYSICAL EXAM  Vitals:   05/21/18 1535  BP: 124/86  Pulse: 84  Weight: 196 lb (88.9 kg)  Height: 5' (1.524 m)   Body mass index is 38.28 kg/m.  Generalized: Well developed, in no acute distress  Neck: circumference 16 inches, Mallampati 3-4, no braces and  no retainers.  Bruxism.    Neurological examination  Mentation: Alert oriented to time, place, history taking. Follows all commands speech and language fluent Cranial nerve - taste an smell have decreased- changes towards more hot sauce, dark chocolates. Smell is blunted.  Pupils were equal round reactive to light. Extraocular movements were full, visual field were full on confrontational test. Facial sensation and strength were normal.  Uvula and  Tongue move  midline. Head turning and shoulder shrug  were normal and symmetric. Motor:  symmetric motor tone and strength is noted throughout.  Sensory:all primary modalities intact. Coordination: normal finger-nose-finger , normal handwriting skills. Gait and station: Gait is normal.  Reflexes: Deep tendon reflexes are symmetric  bilaterally.     DIAGNOSTIC DATA (LABS, IMAGING, TESTING) - I reviewed patient records, labs, notes, testing and imaging myself where available.  Lab Results  Component Value Date   WBC 9.8 02/24/2018   HGB 12.4 02/24/2018   HCT 37.8 02/24/2018   MCV 86 02/24/2018   PLT 396 02/24/2018      Component Value Date/Time   NA 138 02/24/2018 1428   K 4.7  02/24/2018 1428   CL 108 (H) 02/24/2018 1428   CO2 18 (L) 02/24/2018 1428   GLUCOSE 99 02/24/2018 1428   GLUCOSE 111 (H) 11/17/2013 0540   BUN 12 02/24/2018 1428   CREATININE 0.92 02/24/2018 1428   CALCIUM 8.9 02/24/2018 1428   PROT 6.5 02/24/2018 1428   ALBUMIN 4.2 02/24/2018 1428   AST 10 02/24/2018 1428   ALT 11 02/24/2018 1428   ALKPHOS 56 02/24/2018 1428   BILITOT <0.2 02/24/2018 1428   GFRNONAA 77 02/24/2018 1428   GFRAA 88 02/24/2018 1428       ASSESSMENT AND PLAN:  1. OSA on CPAP, 100% compliance on 05-18-2017,   2)negative HLA test in 2016. No longer persistent hypersomnia - she is now endorsed the Epworth at 4 points.   3) sleep hygiene- She  implemented sleep routines, set bed time and rise time. Protected sleep  time. No longer on Adderall since 2017 .  She should follow-up in 12 months with me or NP .   Larey Seat, MD   05/21/2018, 4:07 PM [facility] [address] [place], [place] [address] [phone]  Note: This document was prepared with digital dictation and possible smart phrase technology. Any transcriptional errors that result from this process are unintentional.

## 2018-05-23 DIAGNOSIS — M222X2 Patellofemoral disorders, left knee: Secondary | ICD-10-CM | POA: Diagnosis not present

## 2018-05-23 DIAGNOSIS — M222X1 Patellofemoral disorders, right knee: Secondary | ICD-10-CM | POA: Diagnosis not present

## 2018-05-23 DIAGNOSIS — M25562 Pain in left knee: Secondary | ICD-10-CM | POA: Diagnosis not present

## 2018-05-23 DIAGNOSIS — M23303 Other meniscus derangements, unspecified medial meniscus, right knee: Secondary | ICD-10-CM | POA: Diagnosis not present

## 2018-05-28 DIAGNOSIS — N898 Other specified noninflammatory disorders of vagina: Secondary | ICD-10-CM | POA: Diagnosis not present

## 2018-05-30 DIAGNOSIS — M222X2 Patellofemoral disorders, left knee: Secondary | ICD-10-CM | POA: Diagnosis not present

## 2018-05-30 DIAGNOSIS — M25562 Pain in left knee: Secondary | ICD-10-CM | POA: Diagnosis not present

## 2018-05-30 DIAGNOSIS — M222X1 Patellofemoral disorders, right knee: Secondary | ICD-10-CM | POA: Diagnosis not present

## 2018-05-30 DIAGNOSIS — M23303 Other meniscus derangements, unspecified medial meniscus, right knee: Secondary | ICD-10-CM | POA: Diagnosis not present

## 2018-06-01 ENCOUNTER — Encounter: Payer: Self-pay | Admitting: Family Medicine

## 2018-06-01 ENCOUNTER — Other Ambulatory Visit: Payer: Self-pay

## 2018-06-01 ENCOUNTER — Ambulatory Visit: Payer: BLUE CROSS/BLUE SHIELD | Admitting: Family Medicine

## 2018-06-01 VITALS — BP 118/74 | HR 71 | Temp 98.5°F | Ht 60.0 in | Wt 198.2 lb

## 2018-06-01 DIAGNOSIS — M62838 Other muscle spasm: Secondary | ICD-10-CM | POA: Diagnosis not present

## 2018-06-01 DIAGNOSIS — W57XXXA Bitten or stung by nonvenomous insect and other nonvenomous arthropods, initial encounter: Secondary | ICD-10-CM | POA: Diagnosis not present

## 2018-06-01 DIAGNOSIS — S30861A Insect bite (nonvenomous) of abdominal wall, initial encounter: Secondary | ICD-10-CM

## 2018-06-01 MED ORDER — CYCLOBENZAPRINE HCL 5 MG PO TABS: ORAL_TABLET | ORAL | 0 refills | Status: DC

## 2018-06-01 NOTE — Patient Instructions (Addendum)
I do not see any remnant from the previous tick bite.  For itching okay to apply hydrocortisone cream over-the-counter up to twice per day as needed.  If any surrounding redness or new rash return for recheck immediately.  It is unlikely that the neck pains and muscle spasms are due to the tick bite.  Can try the muscle relaxant up to 3 times per day, okay to continue meloxicam for now.  If symptoms are not improving into next week, can consider doing some blood work including Lyme disease testing but that would be less likely.  Please return sooner if any new or worsening symptoms.  Thank you for coming in today.   Tick Bite Information, Adult Ticks are insects that can bite. Most ticks live in shrubs and grassy areas. They climb onto people and animals that go by. Then they bite. Some ticks carry germs that can make you sick. How can I prevent tick bites?  Use an insect repellent that has 20% or higher of the ingredients DEET, picaridin, or IR3535. Put this insect repellent on: ? Bare skin. ? The tops of your boots. ? Your pant legs. ? The ends of your sleeves.  If you use an insect repellent that has the ingredient permethrin, make sure to follow the instructions on the bottle. Treat the following: ? Clothing. ? Supplies. ? Boots. ? Tents.  Wear long sleeves, long pants, and light colors.  Tuck your pant legs into your socks.  Stay in the middle of the trail.  Try not to walk through long grass.  Before going inside your house, check your clothes, hair, and skin for ticks. Make sure to check your head, neck, armpits, waist, groin, and joint areas.  Check for ticks every day.  When you come indoors: ? Wash your clothes right away. ? Shower right away. ? Dry your clothes in a dryer on high heat for 60 minutes or more. What is the right way to remove a tick? Remove a tick from your skin as soon as possible.  To remove a tick that is crawling on your skin: ? Go outdoors and  brush the tick off. ? Use tape or a lint roller.  To remove a tick that is biting: ? Wash your hands. ? If you have latex gloves, put them on. ? Use tweezers, curved forceps, or a tick-removal tool to grasp the tick. Grasp the tick as close to your skin and as close to the tick's head as possible. ? Gently pull up until the tick lets go.  Try to keep the tick's head attached to its body.  Do not twist or jerk the tick.  Do not squeeze or crush the tick.  Do not try to remove a tick with heat, alcohol, petroleum jelly, or fingernail polish. How should I get rid of a tick? Here are some ways to get rid of a tick that is alive:  Place the tick in rubbing alcohol.  Place the tick in a bag or container you can close tightly.  Wrap the tick tightly in tape.  Flush the tick down the toilet.  Contact a doctor if:  You have symptoms of a disease, such as: ? Pain in a muscle, joint, or bone. ? Trouble walking or moving your legs. ? Numbness in your legs. ? Inability to move (paralysis). ? A red rash that makes a circle (bull's-eye rash). ? Redness and swelling where the tick bit you. ? A fever. ? Throwing up (  vomiting) over and over. ? Diarrhea. ? Weight loss. ? Tender and swollen lymph glands. ? Shortness of breath. ? Cough. ? Belly pain (abdominal pain). ? Headache. ? Being more tired than normal. ? A change in how alert (conscious) you are. ? Confusion. Get help right away if:  You cannot remove a tick.  A part of a tick breaks off and gets stuck in your skin.  You are feeling worse. Summary  Ticks may carry germs that can make you sick.  To prevent tick bites, wear long sleeves, long pants, and light colors. Use insect repellent. Follow the instructions on the bottle.  If the tick is biting, do not try to remove it with heat, alcohol, petroleum jelly, or fingernail polish.  Use tweezers, curved forceps, or a tick-removal tool to grasp the tick. Gently pull up  until the tick lets go. Do not twist or jerk the tick. Do not squeeze or crush the tick.  If you have symptoms, contact a doctor. This information is not intended to replace advice given to you by your health care provider. Make sure you discuss any questions you have with your health care provider. Document Released: 11/16/2009 Document Revised: 12/02/2016 Document Reviewed: 12/02/2016 Elsevier Interactive Patient Education  [employer].   If you have lab work done today you will be contacted with your lab results within the next 2 weeks.  If you have not heard from [place] then please contact us. The fastest way to get your results is to register for My Chart.   IF you received an x-ray today, you will receive an invoice from [facility]. Please contact [facility] at [phone] with questions or concerns regarding your invoice.   IF you received labwork today, you will receive an invoice from [place]. Please contact [facility] at [phone] with questions or concerns regarding your invoice.   Our billing staff will not be able to assist you with questions regarding bills from these companies.  You will be contacted with the lab results as soon as they are available. The fastest way to get your results is to activate your My Chart account. Instructions are located on the last page of this paperwork. If you have not heard from [place] regarding the results in 2 weeks, please contact this office.

## 2018-06-01 NOTE — Progress Notes (Signed)
Subjective:  By signing my name below, I, Meagan [name], attest that this documentation has been prepared under the direction and in the presence of [name], MD Electronically Signed: Charline Bills, ED Scribe 06/01/2018 at 9:05 AM.   Patient ID: Meagan [name], female    DOB: [birth year], 42 y.o.   MRN: [id number]  Chief Complaint  Patient presents with  . Tick Removal    3 weeks (on tummy)  . Neck Pain    10 days after bite   HPI Meagan [name] is a 42 y.o. female who presents to Primary Care at [facility] complaining of a tick bite 3 wks ago. Pt states that she noticed a tick to her stomach x 3 wks ago that she removed with tweezers within 24 hours. She noticed bilat neck stiffness 7-10 days after bite and itchiness to the area. Reports neck stiffness worsens as the day progresses. Pt has tried Mobic and massaging the area with some improvement. Denies rash, HA, fever, abdominal pain, new arthralgias (reports chronic R knee pain), new exercises. 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 Followed by [name] for R knee pain.  Patient Active Problem List   Diagnosis Date Noted  . Bilateral knee pain 03/17/2018  . Gastroesophageal reflux disease 03/02/2018  . Seasonal allergic rhinitis 03/02/2018  . Acquired hypothyroidism 03/02/2018  . RLD (ruptured lumbar disc) 03/02/2018  . Generalized anxiety disorder 09/03/2017  . Obesity 09/03/2017  . OSA on CPAP 02/09/2015  . Uncontrolled narcolepsy 04/03/2013  . Hypersomnia, persistent 01/11/2013   Past Medical History:  Diagnosis Date  . Acquired hypothyroidism 03/02/2018  . Allergy    Seasonal  . Anxiety   . Complication of anesthesia    post -op nausea and vomiting at age 38  . Constipation, chronic   . Depression   . Family history of anesthesia complication    cousin woke-up during procedure  . GERD (gastroesophageal reflux disease)   . Narcolepsy   . Ruptured lumbar disc    L5  . Sleep apnea    wears CPAP nightly  . Sleep  paralysis    at times  . 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 Meanwhile  Medications have become ineffective and patient gained 100 pounds, developed OSA, now on CPAP 9 cm, MSLT to follow in 04-19-13.   . Vivid dream    Past Surgical History:  Procedure Laterality Date  . CHOLECYSTECTOMY N/A 01/01/2014   Procedure: LAPAROSCOPIC CHOLECYSTECTOMY WITH INTRAOPERATIVE CHOLANGIOGRAM;  Surgeon: Wilmon Arms. Corliss Skains, MD;  Location: [facility] OR;  Service: General;  Laterality: N/A;  . TONSILLECTOMY AND ADENOIDECTOMY  1981   age 84  . VAGINAL DELIVERY     Allergies  Allergen Reactions  . Amoxicillin Itching and Rash   Prior to Admission medications   Medication Sig Start Date End Date Taking? Authorizing Provider  desogestrel-ethinyl estradiol (KARIVA,AZURETTE,MIRCETTE) 0.15-0.02/0.01 MG (21/5) tablet Take 1 tablet by mouth daily.   Yes [provider]  FLUoxetine (PROZAC) 10 MG tablet Take 1 tablet (10 mg total) by mouth daily. 08/26/17  Yes Sherren Mocha, MD  levothyroxine (SYNTHROID, LEVOTHROID) 50 MCG tablet Take 1 tablet (50 mcg total) by mouth daily. 03/01/18  Yes Sherren Mocha, MD  meloxicam (MOBIC) 15 MG tablet Take 1 tablet (15 mg total) by mouth daily as needed for pain. 02/24/18  Yes Sherren Mocha, MD  montelukast (SINGULAIR) 10 MG tablet TAKE 1 TABLET (10 MG TOTAL) BY MOUTH  AT BEDTIME. 02/24/18  Yes Sherren Mocha, MD  Multiple Vitamins-Minerals (MULTIVITAMIN ADULT PO) multivitamin   Yes [provider]  omeprazole (PRILOSEC) 20 MG capsule TAKE 1 CAPSULE BY MOUTH EVERY DAY Patient taking differently: Take 20 mg by mouth daily as needed.  10/18/17  Yes Sherren Mocha, MD   Social History   Socioeconomic History  . Marital status: Single    Spouse name: Not on file  . Number of children: 1  . Years of education: some col.  . Highest education level: Not on file  Occupational History    Employer: [employer]    Comment: [employer]    Social Needs  . Financial resource strain: Not on file  . Food insecurity:    Worry: Not on file    Inability: Not on file  . Transportation needs:    Medical: Not on file    Non-medical: Not on file  Tobacco Use  . Smoking status: Former Smoker    Start date: 09/05/2001  . Smokeless tobacco: Never Used  . Tobacco comment: 10 years ago  Substance and Sexual Activity  . Alcohol use: Yes    Comment: occasionally,one 3-7 oz of wine per week  . Drug use: No  . Sexual activity: Not Currently    Birth control/protection: Pill    Comment: kariva  Lifestyle  . Physical activity:    Days per week: Not on file    Minutes per session: Not on file  . Stress: Not on file  Relationships  . Social connections:    Talks on phone: Not on file    Gets together: Not on file    Attends religious service: Not on file    Active member of club or organization: Not on file    Attends meetings of clubs or organizations: Not on file    Relationship status: Not on file  . Intimate partner violence:    Fear of current or ex partner: Not on file    Emotionally abused: Not on file    Physically abused: Not on file    Forced sexual activity: Not on file  Other Topics Concern  . Not on file  Social History Narrative   Patient is single and lives at home, her daughter lives with her.   Patient has one child.   Patient is working full-time.   Patient has some college education.   Patient is left handed.   Patient does not drink any caffeine.   Review of Systems  Constitutional: Negative for fever.  Gastrointestinal: Negative for abdominal pain.  Musculoskeletal: Positive for arthralgias (chronic R knee pain) and neck stiffness.  Skin: Positive for wound (tick bite to abdomen). Negative for rash.  Neurological: Negative for headaches.      Objective:   Physical Exam  Constitutional: She is oriented to person, place, and time. She appears well-developed and well-nourished. No distress.  HENT:   Head: Normocephalic and atraumatic.  Mouth/Throat: Oropharynx is clear and moist. No oral lesions.  No rash.  Eyes: Conjunctivae and EOM are normal.  Neck: Neck supple. No tracheal deviation present.  Cardiovascular: Normal rate.  Pulmonary/Chest: Effort normal. No respiratory distress.  Musculoskeletal: Normal range of motion.  Discomfort in upper trapezius and paraspinals of neck. No midline bony tenderness. Lacking ~10 degrees of rotation bilat. Lacking 10-15 degrees of extension. No rash.  Lymphadenopathy:    She has no cervical adenopathy.  Neurological: She is alert and oriented to person, place,  and time.  Skin: Skin is warm and dry. No rash noted.  Small healing wound ~3 mm across of mid abdomen, slightly excoriated. No remnant tick parts. No surrounding rash or erythema.  Psychiatric: She has a normal mood and affect. Her behavior is normal.  Nursing note and vitals reviewed.  Vitals:   06/01/18 0854  BP: 118/74  Pulse: 71  Temp: 98.5 F (36.9 C)  TempSrc: Oral  SpO2: 96%  Weight: 198 lb 3.2 oz (89.9 kg)  Height: 5' (1.524 m)      Assessment & Plan:    GRAYLEE ARUTYUNYAN is a 42 y.o. female Tick bite of abdomen, initial encounter  -Removed appropriately, no sign of remnant tick parts.  Hydrocortisone over-the-counter as needed for itching.  RTC precautions if rash  Muscle spasms of neck - Plan: cyclobenzaprine (FLEXERIL) 5 MG tablet  -Unlikely related to tick, discussed Lyme disease testing, reportedly may have had that when much younger.  Current knee pain present prior to take bite and has been recurrent issue.  No rash, no fever, no other concerning symptoms, she decided to defer Lyme testing today.  Will try initial Flexeril for muscle spasm and RTC precautions if not improving.  Continue Mobic, heat, massage, range of motion as tolerated.   Meds ordered this encounter  Medications  . cyclobenzaprine (FLEXERIL) 5 MG tablet    Sig: 1 pill by mouth up to every 8 hours  as needed. Start with one pill by mouth each bedtime as needed due to sedation    Dispense:  15 tablet    Refill:  0   Patient Instructions    I do not see any remnant from the previous tick bite.  For itching okay to apply hydrocortisone cream over-the-counter up to twice per day as needed.  If any surrounding redness or new rash return for recheck immediately.  It is unlikely that the neck pains and muscle spasms are due to the tick bite.  Can try the muscle relaxant up to 3 times per day, okay to continue meloxicam for now.  If symptoms are not improving into next week, can consider doing some blood work including Lyme disease testing but that would be less likely.  Please return sooner if any new or worsening symptoms.  Thank you for coming in today.   Tick Bite Information, Adult Ticks are insects that can bite. Most ticks live in shrubs and grassy areas. They climb onto people and animals that go by. Then they bite. Some ticks carry germs that can make you sick. How can I prevent tick bites?  Use an insect repellent that has 20% or higher of the ingredients DEET, picaridin, or IR3535. Put this insect repellent on: ? Bare skin. ? The tops of your boots. ? Your pant legs. ? The ends of your sleeves.  If you use an insect repellent that has the ingredient permethrin, make sure to follow the instructions on the bottle. Treat the following: ? Clothing. ? Supplies. ? Boots. ? Tents.  Wear long sleeves, long pants, and light colors.  Tuck your pant legs into your socks.  Stay in the middle of the trail.  Try not to walk through long grass.  Before going inside your house, check your clothes, hair, and skin for ticks. Make sure to check your head, neck, armpits, waist, groin, and joint areas.  Check for ticks every day.  When you come indoors: ? Wash your clothes right away. ? Shower right away. ?  Dry your clothes in a dryer on high heat for 60 minutes or more. What is the  right way to remove a tick? Remove a tick from your skin as soon as possible.  To remove a tick that is crawling on your skin: ? Go outdoors and brush the tick off. ? Use tape or a lint roller.  To remove a tick that is biting: ? Wash your hands. ? If you have latex gloves, put them on. ? Use tweezers, curved forceps, or a tick-removal tool to grasp the tick. Grasp the tick as close to your skin and as close to the tick's head as possible. ? Gently pull up until the tick lets go.  Try to keep the tick's head attached to its body.  Do not twist or jerk the tick.  Do not squeeze or crush the tick.  Do not try to remove a tick with heat, alcohol, petroleum jelly, or fingernail polish. How should I get rid of a tick? Here are some ways to get rid of a tick that is alive:  Place the tick in rubbing alcohol.  Place the tick in a bag or container you can close tightly.  Wrap the tick tightly in tape.  Flush the tick down the toilet.  Contact a doctor if:  You have symptoms of a disease, such as: ? Pain in a muscle, joint, or bone. ? Trouble walking or moving your legs. ? Numbness in your legs. ? Inability to move (paralysis). ? A red rash that makes a circle (bull's-eye rash). ? Redness and swelling where the tick bit you. ? A fever. ? Throwing up (vomiting) over and over. ? Diarrhea. ? Weight loss. ? Tender and swollen lymph glands. ? Shortness of breath. ? Cough. ? Belly pain (abdominal pain). ? Headache. ? Being more tired than normal. ? A change in how alert (conscious) you are. ? Confusion. Get help right away if:  You cannot remove a tick.  A part of a tick breaks off and gets stuck in your skin.  You are feeling worse. Summary  Ticks may carry germs that can make you sick.  To prevent tick bites, wear long sleeves, long pants, and light colors. Use insect repellent. Follow the instructions on the bottle.  If the tick is biting, do not try to remove it  with heat, alcohol, petroleum jelly, or fingernail polish.  Use tweezers, curved forceps, or a tick-removal tool to grasp the tick. Gently pull up until the tick lets go. Do not twist or jerk the tick. Do not squeeze or crush the tick.  If you have symptoms, contact a doctor. This information is not intended to replace advice given to you by your health care provider. Make sure you discuss any questions you have with your health care provider. Document Released: 11/16/2009 Document Revised: 12/02/2016 Document Reviewed: 12/02/2016 Elsevier Interactive Patient Education  [employer].   If you have lab work done today you will be contacted with your lab results within the next 2 weeks.  If you have not heard from [place] then please contact us. The fastest way to get your results is to register for My Chart.   IF you received an x-ray today, you will receive an invoice from [facility]. Please contact [facility] at [phone] with questions or concerns regarding your invoice.   IF you received labwork today, you will receive an invoice from [place]. Please contact [facility] at [phone] with questions or concerns regarding  your invoice.   Our billing staff will not be able to assist you with questions regarding bills from these companies.  You will be contacted with the lab results as soon as they are available. The fastest way to get your results is to activate your My Chart account. Instructions are located on the last page of this paperwork. If you have not heard from [place] regarding the results in 2 weeks, please contact this office.       I personally performed the services described in this documentation, which was scribed in my presence. The recorded information has been reviewed and considered for accuracy and completeness, addended by me as needed, and agree with information above.  Signed,   Meredith Staggers, MD Primary Care at [facility].  06/01/18 9:20 AM

## 2018-06-04 DIAGNOSIS — M25562 Pain in left knee: Secondary | ICD-10-CM | POA: Diagnosis not present

## 2018-06-04 DIAGNOSIS — M222X1 Patellofemoral disorders, right knee: Secondary | ICD-10-CM | POA: Diagnosis not present

## 2018-06-04 DIAGNOSIS — M23303 Other meniscus derangements, unspecified medial meniscus, right knee: Secondary | ICD-10-CM | POA: Diagnosis not present

## 2018-06-04 DIAGNOSIS — M222X2 Patellofemoral disorders, left knee: Secondary | ICD-10-CM | POA: Diagnosis not present

## 2018-06-06 DIAGNOSIS — M222X2 Patellofemoral disorders, left knee: Secondary | ICD-10-CM | POA: Diagnosis not present

## 2018-06-06 DIAGNOSIS — M222X1 Patellofemoral disorders, right knee: Secondary | ICD-10-CM | POA: Diagnosis not present

## 2018-06-06 DIAGNOSIS — M23303 Other meniscus derangements, unspecified medial meniscus, right knee: Secondary | ICD-10-CM | POA: Diagnosis not present

## 2018-06-06 DIAGNOSIS — M25562 Pain in left knee: Secondary | ICD-10-CM | POA: Diagnosis not present

## 2018-06-12 DIAGNOSIS — M222X1 Patellofemoral disorders, right knee: Secondary | ICD-10-CM | POA: Diagnosis not present

## 2018-06-12 DIAGNOSIS — M23303 Other meniscus derangements, unspecified medial meniscus, right knee: Secondary | ICD-10-CM | POA: Diagnosis not present

## 2018-06-12 DIAGNOSIS — M25562 Pain in left knee: Secondary | ICD-10-CM | POA: Diagnosis not present

## 2018-06-12 DIAGNOSIS — M222X2 Patellofemoral disorders, left knee: Secondary | ICD-10-CM | POA: Diagnosis not present

## 2018-06-13 DIAGNOSIS — M222X1 Patellofemoral disorders, right knee: Secondary | ICD-10-CM | POA: Diagnosis not present

## 2018-06-13 DIAGNOSIS — M25562 Pain in left knee: Secondary | ICD-10-CM | POA: Diagnosis not present

## 2018-06-13 DIAGNOSIS — M222X2 Patellofemoral disorders, left knee: Secondary | ICD-10-CM | POA: Diagnosis not present

## 2018-06-13 DIAGNOSIS — M23303 Other meniscus derangements, unspecified medial meniscus, right knee: Secondary | ICD-10-CM | POA: Diagnosis not present

## 2018-06-18 ENCOUNTER — Ambulatory Visit (INDEPENDENT_AMBULATORY_CARE_PROVIDER_SITE_OTHER): Payer: BLUE CROSS/BLUE SHIELD | Admitting: Neurology

## 2018-06-18 DIAGNOSIS — M23303 Other meniscus derangements, unspecified medial meniscus, right knee: Secondary | ICD-10-CM | POA: Diagnosis not present

## 2018-06-18 DIAGNOSIS — M25562 Pain in left knee: Secondary | ICD-10-CM | POA: Diagnosis not present

## 2018-06-18 DIAGNOSIS — M222X1 Patellofemoral disorders, right knee: Secondary | ICD-10-CM | POA: Diagnosis not present

## 2018-06-18 DIAGNOSIS — M222X2 Patellofemoral disorders, left knee: Secondary | ICD-10-CM | POA: Diagnosis not present

## 2018-06-18 DIAGNOSIS — G4733 Obstructive sleep apnea (adult) (pediatric): Secondary | ICD-10-CM | POA: Diagnosis not present

## 2018-06-18 DIAGNOSIS — G4710 Hypersomnia, unspecified: Secondary | ICD-10-CM

## 2018-06-18 DIAGNOSIS — Z9989 Dependence on other enabling machines and devices: Principal | ICD-10-CM

## 2018-06-20 ENCOUNTER — Encounter: Payer: Self-pay | Admitting: Neurology

## 2018-06-20 DIAGNOSIS — M222X1 Patellofemoral disorders, right knee: Secondary | ICD-10-CM | POA: Diagnosis not present

## 2018-06-20 DIAGNOSIS — M222X2 Patellofemoral disorders, left knee: Secondary | ICD-10-CM | POA: Diagnosis not present

## 2018-06-20 DIAGNOSIS — M23303 Other meniscus derangements, unspecified medial meniscus, right knee: Secondary | ICD-10-CM | POA: Diagnosis not present

## 2018-06-20 DIAGNOSIS — M25562 Pain in left knee: Secondary | ICD-10-CM | POA: Diagnosis not present

## 2018-06-23 ENCOUNTER — Encounter: Payer: Self-pay | Admitting: Neurology

## 2018-06-23 NOTE — Procedures (Signed)
[facility] Sleep _0  [facility] [address] [place], [place] [address] NAME: Meagan [name]                                                                 DOB: 14-Oct-1975 MEDICAL RECORD [id number]                                          DOS:  06/19/2018 REFERRING PHYSICIAN: Delman Cheadle, MD STUDY PERFORMED: Home Sleep Study on apnea link.  HISTORY: 21 May 2018 visit with Ms. [name], a 42 year old Caucasian right-handed female patient who has been diagnosed with sleep disorders since 2002.  Her original work-up was for narcolepsy/ excessive daytime sleepiness with Dr. Baird Lyons.  The patient transferred to [facility] in 2013-2014 and was here diagnosed with obstructive sleep apnea.  HLA test for narcolepsy earlier was negative, but she did have some persistent hypersomnia until she changed her sleep habits significantly.  Since then she has no longer needed Adderall.  She has become a highly compliant CPAP user - today shows 87% compliance.  CPAP is set at 9 cmH2O pressure with 2 cm EPR, the average daily usage time is 5 hours 17 minutes, the residual AHI is 0.8.  She has minimal air leaks. The machine is now due to be replaced.  She endorsed the fatigue severity score at 14 points, the Epworth sleepiness score at 4 out of 24 possible points. I would like for the patient to change to an auto titration device. I have the previous sleep study available.  BMI: 38.28  STUDY RESULTS:  Total Recording Time: 9 hours 59 minutes; Valid test time 9 hand 54 min  Total Apnea/Hypopnea Index (AHI): all hypopneas at 2.7 /h; RDI:  5.7 /h;  REM AHI cannot be interpreted by this device. Average Oxygen Saturation:  92%; Lowest Oxygen Desaturation: 86 %.  Total Time Oxygen Saturation below 89 %:  8.0 min., below 90% 35 min. Average Heart Rate:  70 bpm (between 56 and 112 bpm). IMPRESSION: This HST recorded an AHI too low to justify continued CPAP therapy.  RECOMMENDATION: This result is  contradicted by the patient's experience of not sleeping well without. Her baseline AHI in 2014 was 28.4/h and RDI was 36.7/h. with an Epworth sleepiness Score at 19/24 points. I like to order an attended sleep study to confirm that apnea is no longer present. If the AHI exceeds 5/h, will titrate.   I certify that I have reviewed the raw data recording prior to the issuance of this report in accordance with the standards of the American Academy of Sleep Medicine (AASM). Larey Seat, M.D.    06-22-2018   Medical Director of [facility] at [facility], accredited by the AASM. Diplomat of the ABPN and ABSM.

## 2018-06-23 NOTE — Addendum Note (Signed)
Addended by: Melvyn Novas on: 06/23/2018 02:57 PM   Modules accepted: Orders

## 2018-07-01 ENCOUNTER — Encounter: Payer: Self-pay | Admitting: Neurology

## 2018-07-13 ENCOUNTER — Ambulatory Visit: Payer: BLUE CROSS/BLUE SHIELD | Admitting: Family Medicine

## 2018-07-13 VITALS — BP 125/81 | HR 100 | Temp 98.0°F | Resp 16 | Ht 60.0 in | Wt 202.0 lb

## 2018-07-13 DIAGNOSIS — K219 Gastro-esophageal reflux disease without esophagitis: Secondary | ICD-10-CM

## 2018-07-13 DIAGNOSIS — L929 Granulomatous disorder of the skin and subcutaneous tissue, unspecified: Secondary | ICD-10-CM | POA: Diagnosis not present

## 2018-07-13 DIAGNOSIS — B070 Plantar wart: Secondary | ICD-10-CM

## 2018-07-13 DIAGNOSIS — E039 Hypothyroidism, unspecified: Secondary | ICD-10-CM

## 2018-07-13 DIAGNOSIS — N898 Other specified noninflammatory disorders of vagina: Secondary | ICD-10-CM

## 2018-07-13 DIAGNOSIS — Z20828 Contact with and (suspected) exposure to other viral communicable diseases: Secondary | ICD-10-CM

## 2018-07-13 DIAGNOSIS — F411 Generalized anxiety disorder: Secondary | ICD-10-CM

## 2018-07-13 MED ORDER — TERBINAFINE HCL 250 MG PO TABS: 250.0000 mg | ORAL_TABLET | Freq: Every day | ORAL | 0 refills | Status: DC

## 2018-07-13 MED ORDER — FLUOXETINE HCL 10 MG PO TABS: 10.0000 mg | ORAL_TABLET | Freq: Every day | ORAL | 3 refills | Status: DC

## 2018-07-13 NOTE — Patient Instructions (Addendum)
If you have lab work done today you will be contacted with your lab results within the next 2 weeks.  If you have not heard from [place] then please contact us. The fastest way to get your results is to register for My Chart.   IF you received an x-ray today, you will receive an invoice from [facility]. Please contact [facility] at [phone] with questions or concerns regarding your invoice.   IF you received labwork today, you will receive an invoice from [place]. Please contact [facility] at [phone] with questions or concerns regarding your invoice.   Our billing staff will not be able to assist you with questions regarding bills from these companies.  You will be contacted with the lab results as soon as they are available. The fastest way to get your results is to activate your My Chart account. Instructions are located on the last page of this paperwork. If you have not heard from [place] regarding the results in 2 weeks, please contact this office.     Plantar Warts Warts are small growths on the skin. They can occur on various areas of the body. When they occur on the underside (sole) of the foot, they are called plantar warts. Plantar warts often occur in groups, with several small warts around a larger growth. They tend to develop over areas of pressure, such as the heel or the ball of the foot. Most warts are not painful, and they usually do not cause problems. However, plantar warts may cause pain when you walk because pressure is applied to them. Warts often go away on their own in time. Various treatments may be done if needed. Sometimes, warts go away and then they come back again. What are the causes? Plantar warts are caused by a type of virus that is called human papillomavirus (HPV). HPV attacks a break in the skin of the foot. Walking barefoot can lead to exposure to the virus. These warts may spread to other areas of the sole. They spread to other areas  of the body only through direct contact. What increases the risk? Plantar warts are more likely to develop in:  People who are 35-37 years of age.  People who use public showers or locker rooms.  People who have a weakened body defense system (immune system).  What are the signs or symptoms? Plantar warts may be flat or slightly raised. They may grow into the deeper layers of skin or rise above the surface of the skin. Most plantar warts have a rough surface. They may cause pain when you use your foot to support your body weight. How is this diagnosed? A plantar wart can usually be diagnosed from its appearance. In some cases, a tissue sample may be removed (biopsy) to be looked at under a microscope. How is this treated? In many cases, warts do not need treatment. Without treatment, they often go away over a period of many months to a couple years. If treatment is needed, options may include:  Applying medicated solutions, creams, or patches to the wart. These may be over-the-counter or prescription medicines that make the skin soft so that layers will gradually shed away. In many cases, the medicine is applied one or two times per day and covered with a bandage.  Putting duct tape over the top of the wart (occlusion). You will leave the tape in place for as long as told by your health care provider, then you will replace it  with a new strip of tape. This is done until the wart goes away.  Freezing the wart with liquid nitrogen (cryotherapy).  Burning the wart with: ? Laser treatment. ? An electrified probe (electrocautery).  Injection of a medicine (Candida antigen) into the wart to help the body's immune system to fight off the wart.  Surgery to remove the wart.  Follow these instructions at home:  Apply medicated creams or solutions only as told by your health care provider. This may involve: ? Soaking the affected area in warm water. ? Removing the top layer of softened skin  before you apply the medicine. A pumice stone works well for removing the tissue. ? Applying a bandage over the affected area after you apply the medicine. ? Repeating the process daily or as told by your health care provider.  Do not scratch or pick at a wart.  Wash your hands after you touch a wart.  If a wart is painful, try applying a bandage with a hole in the middle over the wart. The helps to take pressure off the wart.  Keep all follow-up visits as told by your health care provider. This is important. How is this prevented? Take these actions to help prevent warts:  Wear shoes and socks. Change your socks daily.  Keep your feet clean and dry.  Check your feet regularly.  Avoid direct contact with warts on other people.  Contact a health care provider if:  Your warts do not improve after treatment.  You have redness, swelling, or pain at the site of a wart.  You have bleeding from a wart that does not stop with light pressure.  You have diabetes and you develop a wart. This information is not intended to replace advice given to you by your health care provider. Make sure you discuss any questions you have with your health care provider. Document Released: 11/12/2003 Document Revised: 01/28/2016 Document Reviewed: 11/17/2014 Elsevier Interactive Patient Education  [employer].

## 2018-07-13 NOTE — Progress Notes (Signed)
Subjective:    Patient: Meagan [name]  DOB: [birth year]; 42 y.o.   MRN: [id number]  Chief Complaint  Patient presents with  . Verrucous Vulgaris    removal of warts on left foot, pt also has a mole on lt arms she wants looked at    HPI   Medical History Past Medical History:  Diagnosis Date  . Acquired hypothyroidism 03/02/2018  . Allergy    Seasonal  . Anxiety   . Complication of anesthesia    post -op nausea and vomiting at age 21  . Constipation, chronic   . Depression   . Family history of anesthesia complication    cousin woke-up during procedure  . GERD (gastroesophageal reflux disease)   . Narcolepsy   . Ruptured lumbar disc    L5  . Sleep apnea    wears CPAP nightly  . Sleep paralysis    at times  . 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 Meanwhile  Medications have become ineffective and patient gained 100 pounds, developed OSA, now on CPAP 9 cm, MSLT to follow in 04-19-13.   . Vivid dream    Past Surgical History:  Procedure Laterality Date  . CHOLECYSTECTOMY N/A 01/01/2014   Procedure: LAPAROSCOPIC CHOLECYSTECTOMY WITH INTRAOPERATIVE CHOLANGIOGRAM;  Surgeon: Wilmon Arms. Corliss Skains, MD;  Location: [facility] OR;  Service: General;  Laterality: N/A;  . TONSILLECTOMY AND ADENOIDECTOMY  1981   age 67  . VAGINAL DELIVERY     Current Outpatient Medications on File Prior to Visit  Medication Sig Dispense Refill  . cyclobenzaprine (FLEXERIL) 5 MG tablet 1 pill by mouth up to every 8 hours as needed. Start with one pill by mouth each bedtime as needed due to sedation 15 tablet 0  . desogestrel-ethinyl estradiol (KARIVA,AZURETTE,MIRCETTE) 0.15-0.02/0.01 MG (21/5) tablet Take 1 tablet by mouth daily.    Marland Kitchen FLUoxetine (PROZAC) 10 MG tablet Take 1 tablet (10 mg total) by mouth daily. 90 tablet 3  . levothyroxine (SYNTHROID, LEVOTHROID) 50 MCG tablet Take 1 tablet (50 mcg total) by mouth daily. 90 tablet 3  . meloxicam (MOBIC) 15 MG tablet Take 1 tablet  (15 mg total) by mouth daily as needed for pain. 90 tablet 3  . montelukast (SINGULAIR) 10 MG tablet TAKE 1 TABLET (10 MG TOTAL) BY MOUTH AT BEDTIME. 90 tablet 3  . Multiple Vitamins-Minerals (MULTIVITAMIN ADULT PO) multivitamin    . omeprazole (PRILOSEC) 20 MG capsule TAKE 1 CAPSULE BY MOUTH EVERY DAY (Patient taking differently: Take 20 mg by mouth daily as needed. ) 90 capsule 0   No current facility-administered medications on file prior to visit.    Allergies  Allergen Reactions  . Amoxicillin Itching and Rash   Family History  Problem Relation Age of Onset  . Alcohol abuse Mother   . Depression Mother   . Anxiety disorder Mother   . Sleep apnea Mother   . Alcoholism Mother   . Diabetes Mother   . Alcohol abuse Father   . Alcoholism Father   . Alcohol abuse Brother   . Alcoholism Brother   . Multiple sclerosis Paternal Uncle    Social History   Socioeconomic History  . Marital status: Single    Spouse name: Not on file  . Number of children: 1  . Years of education: some col.  . Highest education level: Not on file  Occupational History    Employer: [employer]    Comment: [employer]  Social Needs  . Financial resource strain: Not on file  . Food insecurity:    Worry: Not on file    Inability: Not on file  . Transportation needs:    Medical: Not on file    Non-medical: Not on file  Tobacco Use  . Smoking status: Former Smoker    Start date: 09/05/2001  . Smokeless tobacco: Never Used  . Tobacco comment: 10 years ago  Substance and Sexual Activity  . Alcohol use: Yes    Comment: occasionally,one 3-7 oz of wine per week  . Drug use: No  . Sexual activity: Not Currently    Birth control/protection: Pill    Comment: kariva  Lifestyle  . Physical activity:    Days per week: Not on file    Minutes per session: Not on file  . Stress: Not on file  Relationships  . Social connections:    Talks on phone: Not on file    Gets together: Not on  file    Attends religious service: Not on file    Active member of club or organization: Not on file    Attends meetings of clubs or organizations: Not on file    Relationship status: Not on file  Other Topics Concern  . Not on file  Social History Narrative   Patient is single and lives at home, her daughter lives with her.   Patient has one child.   Patient is working full-time.   Patient has some college education.   Patient is left handed.   Patient does not drink any caffeine.   Depression screen [facility] 2/9 07/13/2018 06/01/2018 02/24/2018 10/25/2017 06/23/2017  Decreased Interest 0 0 0 0 0  Down, Depressed, Hopeless 0 0 0 0 0  PHQ - 2 Score 0 0 0 0 0    ROS As noted in HPI  Objective:  BP 125/81   Pulse 100   Temp 98 F (36.7 C) (Oral)   Resp 16   Ht 5' (1.524 m)   Wt 202 lb (91.6 kg)   LMP 06/05/2018   SpO2 96%   BMI 39.45 kg/m  Physical Exam  Constitutional: She is oriented to person, place, and time. She appears well-developed and well-nourished. No distress.  HENT:  Head: Normocephalic and atraumatic.  Right Ear: External ear normal.  Left Ear: External ear normal.  Eyes: Conjunctivae are normal. No scleral icterus.  Neck: Normal range of motion. Neck supple. No thyromegaly present.  Cardiovascular: Normal rate, regular rhythm, normal heart sounds and intact distal pulses.  Pulmonary/Chest: Effort normal and breath sounds normal. No respiratory distress.  Musculoskeletal: She exhibits no edema.  Lymphadenopathy:    She has no cervical adenopathy.  Neurological: She is alert and oriented to person, place, and time.  Skin: Skin is warm and dry. She is not diaphoretic. No erythema.  Psychiatric: She has a normal mood and affect. Her behavior is normal.       Assessment & Plan:   1. Plantar wart - s/p cryosurgery today to trx - very small, almost resolved so no need for debridement  2. Gastroesophageal reflux disease without esophagitis   3. Granuloma of  skin - reassred - pt declined intervention  4. 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 Vaginal pruritus - chronically but worsening - seen gyn mult times and has always failed trx w/ otc "azoles" top and oral fluconazole x 2. has appt coming up so declines exam - right at introitus only. Try terbinafine for poss better candidiasis coverage. Warned of side effects and d/c immed if any abd pain or other concerns/worsening  6. Acquired hypothyroidism - prev followed by Dr. Talmage Nap but very stable so will transfer rx from here - ok to refil whenever needed  7. Generalized anxiety disorder - doing MUCH better on prozac 10 - cont     Patient will continue on current chronic medications other than changes noted above, so ok to refill when needed.   See after visit summary for patient specific instructions.   Meds ordered this encounter  Medications  . FLUoxetine (PROZAC) 10 MG tablet    Sig: Take 1 tablet (10 mg total) by mouth daily.    Dispense:  90 tablet    Refill:  3  . terbinafine (LAMISIL) 250 MG tablet    Sig: Take 1 tablet (250 mg total) by mouth daily.    Dispense:  30 tablet    Refill:  0    Patient verbalized to me that they understand the following: diagnosis, what is being done for them, what to expect and what should be done at home.  Their questions have been answered. They understand that I am unable to predict every possible medication interaction or adverse outcome and that if any unexpected symptoms arise, they should contact us and their pharmacist, as well as never hesitate to seek urgent/emergent care at [facility] or ER if they think it might be warranted.    Norberto Sorenson, MD, MPH Primary Care at  [facility] [address] [place], [place]  [address] [phone] Office phone  [phone] Office fax  07/13/18 2:05 PM

## 2018-07-14 ENCOUNTER — Encounter: Payer: Self-pay | Admitting: Family Medicine

## 2018-07-15 ENCOUNTER — Encounter: Payer: Self-pay | Admitting: Neurology

## 2018-07-19 ENCOUNTER — Ambulatory Visit (INDEPENDENT_AMBULATORY_CARE_PROVIDER_SITE_OTHER): Payer: BLUE CROSS/BLUE SHIELD

## 2018-07-19 ENCOUNTER — Encounter: Payer: Self-pay | Admitting: Family Medicine

## 2018-07-19 ENCOUNTER — Ambulatory Visit: Payer: BLUE CROSS/BLUE SHIELD | Admitting: Family Medicine

## 2018-07-19 ENCOUNTER — Other Ambulatory Visit: Payer: Self-pay

## 2018-07-19 VITALS — BP 117/83 | HR 88 | Temp 99.8°F | Resp 16 | Ht 60.0 in | Wt 199.0 lb

## 2018-07-19 DIAGNOSIS — R059 Cough, unspecified: Secondary | ICD-10-CM

## 2018-07-19 DIAGNOSIS — R05 Cough: Secondary | ICD-10-CM | POA: Diagnosis not present

## 2018-07-19 DIAGNOSIS — J181 Lobar pneumonia, unspecified organism: Secondary | ICD-10-CM

## 2018-07-19 DIAGNOSIS — R509 Fever, unspecified: Secondary | ICD-10-CM

## 2018-07-19 DIAGNOSIS — N912 Amenorrhea, unspecified: Secondary | ICD-10-CM | POA: Diagnosis not present

## 2018-07-19 DIAGNOSIS — J189 Pneumonia, unspecified organism: Secondary | ICD-10-CM

## 2018-07-19 LAB — POCT URINE PREGNANCY: Preg Test, Ur: NEGATIVE

## 2018-07-19 MED ORDER — DOXYCYCLINE HYCLATE 100 MG PO TABS: 100.0000 mg | ORAL_TABLET | Freq: Two times a day (BID) | ORAL | 0 refills | Status: DC

## 2018-07-19 MED ORDER — BENZONATATE 200 MG PO CAPS: 200.0000 mg | ORAL_CAPSULE | Freq: Three times a day (TID) | ORAL | 0 refills | Status: DC | PRN

## 2018-07-19 NOTE — Patient Instructions (Addendum)
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 There is minimal patchy density at the LEFT lung base, consistent with infectious infiltrate. No pulmonary edema. Surgical clips are present in the UPPER abdomen. IMPRESSION: Focal LEFT LOWER lobe infiltrate. Followup PA and lateral chest X-ray is recommended in 3-4 weeks following trial of antibiotic therapy to ensure resolution and exclude underlying malignancy. Electronically Signed   By: Norva Pavlov M.D.   On: 07/19/2018 09:35     If you have lab work done today you will be contacted with your lab results within the next 2 weeks.  If you have not heard from [place] then please contact us. The fastest way to get your results is to register for My Chart.   IF you received an x-ray today, you will receive an invoice from [facility]. Please contact [facility] at [phone] with questions or concerns regarding your invoice.   IF you received labwork today, you will receive an invoice from [facility]. Please contact [facility] at [phone] with questions or concerns regarding your invoice.   Our billing staff will not be able to assist you with questions regarding bills from these companies.  You will be contacted with the lab results as soon as they are available. The fastest way to get your results is to activate your My Chart account. Instructions are located on the last page of this paperwork. If you have not heard from [place] regarding the results in 2 weeks, please contact this office.      Community-Acquired Pneumonia, Adult Pneumonia is an infection of the lungs. There are different types of pneumonia. One type can develop while a person is in a hospital. A different type, called community-acquired pneumonia, develops in people who are not, or have not recently  been, in the hospital or other health care facility. What are the causes? Pneumonia may be caused by bacteria, viruses, or funguses. Community-acquired pneumonia is often caused by Streptococcus pneumonia bacteria. These bacteria are often passed from one person to another by breathing in droplets from the cough or sneeze of an infected person. What increases the risk? The condition is more likely to develop in:  People who havechronic diseases, such as chronic obstructive pulmonary disease (COPD), asthma, congestive heart failure, cystic fibrosis, diabetes, or kidney disease.  People who haveearly-stage or late-stage HIV.  People who havesickle cell disease.  People who havehad their spleen removed (splenectomy).  People who havepoor Administrator.  People who havemedical conditions that increase the risk of breathing in (aspirating) secretions their own mouth and nose.  People who havea weakened immune system (immunocompromised).  People who smoke.  People whotravel to areas where pneumonia-causing germs commonly exist.  People whoare around animal habitats or animals that have pneumonia-causing germs, including birds, bats, rabbits, cats, and farm animals.  What are the signs or symptoms? Symptoms of this condition include:  Adry cough.  A wet (productive) cough.  Fever.  Sweating.  Chest pain, especially when breathing deeply or coughing.  Rapid breathing or difficulty breathing.  Shortness of breath.  Shaking chills.  Fatigue.  Muscle aches.  How is this diagnosed? Your health care provider will take a medical history and perform a physical exam. You may also have other tests, including:  Imaging studies of your chest, including X-rays.  Tests  to check your blood oxygen level and other blood gases.  Other tests on blood, mucus (sputum), fluid around your lungs (pleural fluid), and urine.  If your pneumonia is severe, other tests may be done to  identify the specific cause of your illness. How is this treated? The type of treatment that you receive depends on many factors, such as the cause of your pneumonia, the medicines you take, and other medical conditions that you have. For most adults, treatment and recovery from pneumonia may occur at home. In some cases, treatment must happen in a hospital. Treatment may include:  Antibiotic medicines, if the pneumonia was caused by bacteria.  Antiviral medicines, if the pneumonia was caused by a virus.  Medicines that are given by mouth or through an IV tube.  Oxygen.  Respiratory therapy.  Although rare, treating severe pneumonia may include:  Mechanical ventilation. This is done if you are not breathing well on your own and you cannot maintain a safe blood oxygen level.  Thoracentesis. This procedureremoves fluid around one lung or both lungs to help you breathe better.  Follow these instructions at home:  Take over-the-counter and prescription medicines only as told by your health care provider. ? Only takecough medicine if you are losing sleep. Understand that cough medicine can prevent your body's natural ability to remove mucus from your lungs. ? If you were prescribed an antibiotic medicine, take it as told by your health care provider. Do not stop taking the antibiotic even if you start to feel better.  Sleep in a semi-upright position at night. Try sleeping in a reclining chair, or place a few pillows under your head.  Do not use tobacco products, including cigarettes, chewing tobacco, and e-cigarettes. If you need help quitting, ask your health care provider.  Drink enough water to keep your urine clear or pale yellow. This will help to thin out mucus secretions in your lungs. How is this prevented? There are ways that you can decrease your risk of developing community-acquired pneumonia. Consider getting a pneumococcal vaccine if:  You are older than 43 years of  age.  You are older than 43 years of age and are undergoing cancer treatment, have chronic lung disease, or have other medical conditions that affect your immune system. Ask your health care provider if this applies to you.  There are different types and schedules of pneumococcal vaccines. Ask your health care provider which vaccination option is best for you. You may also prevent community-acquired pneumonia if you take these actions:  Get an influenza vaccine every year. Ask your health care provider which type of influenza vaccine is best for you.  Go to the dentist on a regular basis.  Wash your hands often. Use hand sanitizer if soap and water are not available.  Contact a health care provider if:  You have a fever.  You are losing sleep because you cannot control your cough with cough medicine. Get help right away if:  You have worsening shortness of breath.  You have increased chest pain.  Your sickness becomes worse, especially if you are an older adult or have a weakened immune system.  You cough up blood. This information is not intended to replace advice given to you by your health care provider. Make sure you discuss any questions you have with your health care provider. Document Released: 08/22/2005 Document Revised: 12/31/2015 Document Reviewed: 12/17/2014 Elsevier Interactive Patient Education  [employer]2018 [employer]

## 2018-07-19 NOTE — Progress Notes (Signed)
Subjective:    Patient: Meagan [name]  DOB: Feb 15, 1976; 42 y.o.   MRN: [id number]  Chief Complaint  Patient presents with  . Cough    with some mucus and chest congestion     HPI Started Sat with severe myalgias/arthralgias, fevers (not check temp), chills, sweats. Went back to work on Tuesday as feeling better but then last fever was Wed morning.  Just tthis morning started coughing stuff up (not sure what it looked like). Yest had pleuritic sxs but not today. 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  No pharyngitisl;   Medical History Past Medical History:  Diagnosis Date  . Acquired hypothyroidism 03/02/2018  . Allergy    Seasonal  . Anxiety   . Complication of anesthesia    post -op nausea and vomiting at age 89  . Constipation, chronic   . Depression   . Family history of anesthesia complication    cousin woke-up during procedure  . GERD (gastroesophageal reflux disease)   . Narcolepsy   . Ruptured lumbar disc    L5  . Sleep apnea    wears CPAP nightly  . Sleep paralysis    at times  . Uncontrolled narcolepsy 04/03/2013   Diagnosed 2002 , by Dr Jetty Duhamellinton Young. Meanwhile  Medications have become ineffective and patient gained 100 pounds, developed OSA, now on CPAP 9 cm, MSLT to follow in 04-19-13.   . Vivid dream    Past Surgical History:  Procedure Laterality Date  . CHOLECYSTECTOMY N/A 01/01/2014   Procedure: LAPAROSCOPIC CHOLECYSTECTOMY WITH INTRAOPERATIVE CHOLANGIOGRAM;  Surgeon: Wilmon ArmsMatthew K. Corliss Skainssuei, MD;  Location: [facility] OR;  Service: General;  Laterality: N/A;  . TONSILLECTOMY AND ADENOIDECTOMY  1981   age 89  . VAGINAL DELIVERY     Current Outpatient Medications on File Prior to Visit  Medication Sig Dispense Refill  . cyclobenzaprine (FLEXERIL) 5 MG tablet 1 pill by mouth up to every 8 hours as needed. Start with one pill by mouth  each bedtime as needed due to sedation 15 tablet 0  . desogestrel-ethinyl estradiol (KARIVA,AZURETTE,MIRCETTE) 0.15-0.02/0.01 MG (21/5) tablet Take 1 tablet by mouth daily.    Marland Kitchen. FLUoxetine (PROZAC) 10 MG tablet Take 1 tablet (10 mg total) by mouth daily. 90 tablet 3  . levothyroxine (SYNTHROID, LEVOTHROID) 50 MCG tablet Take 1 tablet (50 mcg total) by mouth daily. 90 tablet 3  . meloxicam (MOBIC) 15 MG tablet Take 1 tablet (15 mg total) by mouth daily as needed for pain. 90 tablet 3  . montelukast (SINGULAIR) 10 MG tablet TAKE 1 TABLET (10 MG TOTAL) BY MOUTH AT BEDTIME. 90 tablet 3  . Multiple Vitamins-Minerals (MULTIVITAMIN ADULT PO) multivitamin    . omeprazole (PRILOSEC) 20 MG capsule TAKE 1 CAPSULE BY MOUTH EVERY DAY (Patient taking differently: Take 20 mg by mouth daily as needed. ) 90 capsule 0  . terbinafine (LAMISIL) 250 MG tablet Take 1 tablet (250 mg total) by mouth daily. 30 tablet 0   No current facility-administered medications on file prior to visit.    Allergies  Allergen Reactions  . Amoxicillin Itching and Rash   Family History  Problem Relation Age of Onset  . Alcohol abuse Mother   . Depression Mother   . Anxiety disorder Mother   . Sleep apnea Mother   . Alcoholism Mother   . Diabetes  Mother   . Alcohol abuse Father   . Alcoholism Father   . Alcohol abuse Brother   . Alcoholism Brother   . Multiple sclerosis Paternal Uncle    Social History   Socioeconomic History  . Marital status: Single    Spouse name: Not on file  . Number of children: 1  . Years of education: some col.  . Highest education level: Not on file  Occupational History    Employer: [employer]    Comment: [employer]  Social Needs  . Financial resource strain: Not on file  . Food insecurity:    Worry: Not on file    Inability: Not on file  . Transportation needs:    Medical: Not on file    Non-medical: Not on file  Tobacco Use  . Smoking status: Former Smoker     Start date: 09/05/2001  . Smokeless tobacco: Never Used  . Tobacco comment: 10 years ago  Substance and Sexual Activity  . Alcohol use: Yes    Comment: occasionally,one 3-7 oz of wine per week  . Drug use: No  . Sexual activity: Not Currently    Birth control/protection: Pill    Comment: kariva  Lifestyle  . Physical activity:    Days per week: Not on file    Minutes per session: Not on file  . Stress: Not on file  Relationships  . Social connections:    Talks on phone: Not on file    Gets together: Not on file    Attends religious service: Not on file    Active member of club or organization: Not on file    Attends meetings of clubs or organizations: Not on file    Relationship status: Not on file  Other Topics Concern  . Not on file  Social History Narrative   Patient is single and lives at home, her daughter lives with her.   Patient has one child.   Patient is working full-time.   Patient has some college education.   Patient is left handed.   Patient does not drink any caffeine.   Depression screen [facility] 2/9 07/19/2018 07/13/2018 06/01/2018 02/24/2018 10/25/2017  Decreased Interest 0 0 0 0 0  Down, Depressed, Hopeless 0 0 0 0 0  PHQ - 2 Score 0 0 0 0 0    ROS As noted in HPI  Objective:  BP 117/83   Pulse 88   Temp 99.8 F (37.7 C) (Oral)   Resp 16   Ht 5' (1.524 m)   Wt 199 lb (90.3 kg)   SpO2 96%   BMI 38.86 kg/m  Physical Exam  Constitutional: She is oriented to person, place, and time. She appears well-developed and well-nourished. She appears lethargic. She appears ill. No distress.  HENT:  Head: Normocephalic and atraumatic.  Right Ear: Tympanic membrane, external ear and ear canal normal.  Left Ear: Tympanic membrane, external ear and ear canal normal.  Nose: Rhinorrhea present. No mucosal edema. Right sinus exhibits no maxillary sinus tenderness. Left sinus exhibits no maxillary sinus tenderness.  Mouth/Throat: Uvula is midline and mucous membranes are  normal. Posterior oropharyngeal erythema present. No oropharyngeal exudate or posterior oropharyngeal edema.  Eyes: Conjunctivae are normal. Right eye exhibits no discharge. Left eye exhibits no discharge. No scleral icterus.  Neck: Normal range of motion. Neck supple.  Cardiovascular: Normal rate, regular rhythm, normal heart sounds and intact distal pulses.  Pulmonary/Chest: Effort normal. No tachypnea. No respiratory distress. She has  no decreased breath sounds. She has no wheezes. She has no rhonchi. She has rales (Lt >Rt bibasilar).  Lymphadenopathy:    She has no cervical adenopathy.  Neurological: She is oriented to person, place, and time. She appears lethargic.  Skin: Skin is warm and dry. She is not diaphoretic. No erythema.  Psychiatric: She has a normal mood and affect. Her behavior is normal.    POC TESTING Office Visit on 07/19/2018  Component Date Value Ref Range Status  . 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 There is minimal patchy density at the LEFT lung base, consistent with infectious infiltrate. No pulmonary edema. Surgical clips are present in the UPPER abdomen. IMPRESSION: Focal LEFT LOWER lobe infiltrate. Followup PA and lateral chest X-ray is recommended in 3-4 weeks following trial of antibiotic therapy to ensure resolution and exclude underlying malignancy. Electronically Signed   By: Norva Pavlov M.D.   On: 07/19/2018 09:35    Assessment & Plan:   1. Amenorrhea   2. Cough   3. Fever, unspecified   4. Community acquired pneumonia of left lower lobe of lung (HCC)   Recheck in 1 mos for repeat CXR to document clearance. RTC immed if worsening at all.  Patient will continue on current chronic medications other than changes noted above,  so ok to refill when needed.   See after visit summary for patient specific instructions.  Orders Placed This Encounter  Procedures  . DG Chest 2 View    Standing Status:   Future    Number of Occurrences:   1    Standing Expiration Date:   07/19/2019    Order Specific Question:   Reason for Exam (SYMPTOM  OR DIAGNOSIS REQUIRED)    Answer:   LLL rales, cough and fever x 5d, + sick contacts w/ pna as works at [employer] Question:   Is the patient pregnant?    Answer:   No    Order Specific Question:   Preferred imaging location?    Answer:   External  . POCT urine pregnancy    Meds ordered this encounter  Medications  . benzonatate (TESSALON) 200 MG capsule    Sig: Take 1 capsule (200 mg total) by mouth 3 (three) times daily as needed for cough.    Dispense:  30 capsule    Refill:  0  . doxycycline (VIBRA-TABS) 100 MG tablet    Sig: Take 1 tablet (100 mg total) by mouth 2 (two) times daily.    Dispense:  14 tablet    Refill:  0    Patient verbalized to me that they understand the following: diagnosis, what is being done for them, what to expect and what should be done at home.  Their questions have been answered. They understand that I am unable to predict every possible medication interaction or adverse outcome and that if any unexpected symptoms arise, they should contact us and their pharmacist, as well as never hesitate to seek urgent/emergent care at [facility] or ER if they think it might be warranted.    Norberto Sorenson, MD, MPH [facility] [address] [place], [place]  [address] [phone] Office  phone  [phone] Office fax  07/19/18 8:51 AM

## 2018-08-03 ENCOUNTER — Encounter: Payer: Self-pay | Admitting: Family Medicine

## 2018-08-20 ENCOUNTER — Other Ambulatory Visit: Payer: BLUE CROSS/BLUE SHIELD

## 2018-08-24 DIAGNOSIS — E079 Disorder of thyroid, unspecified: Secondary | ICD-10-CM | POA: Diagnosis not present

## 2018-08-24 DIAGNOSIS — Z1231 Encounter for screening mammogram for malignant neoplasm of breast: Secondary | ICD-10-CM | POA: Diagnosis not present

## 2018-08-24 DIAGNOSIS — Z124 Encounter for screening for malignant neoplasm of cervix: Secondary | ICD-10-CM | POA: Diagnosis not present

## 2018-08-24 DIAGNOSIS — N898 Other specified noninflammatory disorders of vagina: Secondary | ICD-10-CM | POA: Diagnosis not present

## 2018-08-24 DIAGNOSIS — Z01419 Encounter for gynecological examination (general) (routine) without abnormal findings: Secondary | ICD-10-CM | POA: Diagnosis not present

## 2018-08-24 DIAGNOSIS — Z6838 Body mass index (BMI) 38.0-38.9, adult: Secondary | ICD-10-CM | POA: Diagnosis not present

## 2018-08-27 ENCOUNTER — Ambulatory Visit: Payer: BLUE CROSS/BLUE SHIELD | Admitting: Family Medicine

## 2018-08-31 ENCOUNTER — Encounter: Payer: Self-pay | Admitting: Family Medicine

## 2018-08-31 ENCOUNTER — Inpatient Hospital Stay
Admission: RE | Admit: 2018-08-31 | Discharge: 2018-08-31 | Disposition: A | Discharge status: Home / Self Care | Payer: BLUE CROSS/BLUE SHIELD | Source: Ambulatory Visit | Attending: Orthopedic Surgery | Admitting: Orthopedic Surgery

## 2018-08-31 ENCOUNTER — Other Ambulatory Visit: Payer: BLUE CROSS/BLUE SHIELD

## 2018-09-03 ENCOUNTER — Encounter: Payer: Self-pay | Admitting: Family Medicine

## 2018-09-20 ENCOUNTER — Ambulatory Visit: Payer: BLUE CROSS/BLUE SHIELD | Admitting: Family Medicine

## 2018-11-03 ENCOUNTER — Telehealth: Payer: Self-pay | Admitting: Family Medicine

## 2018-11-03 NOTE — Telephone Encounter (Signed)
mychart message sent to pt about their appointment with Dr Shaw °

## 2018-11-15 ENCOUNTER — Ambulatory Visit: Payer: BLUE CROSS/BLUE SHIELD | Admitting: Family Medicine

## 2018-12-04 IMAGING — DX DG CHEST 2V
2 series · 2 of 2 positions shown · non-contrast
Comparison: 04/10/2006

CLINICAL DATA: LLL rales, cough and fever x 5d, + sick contacts w/
pna as works at school

EXAM:
CHEST - 2 VIEW

[chest pa]
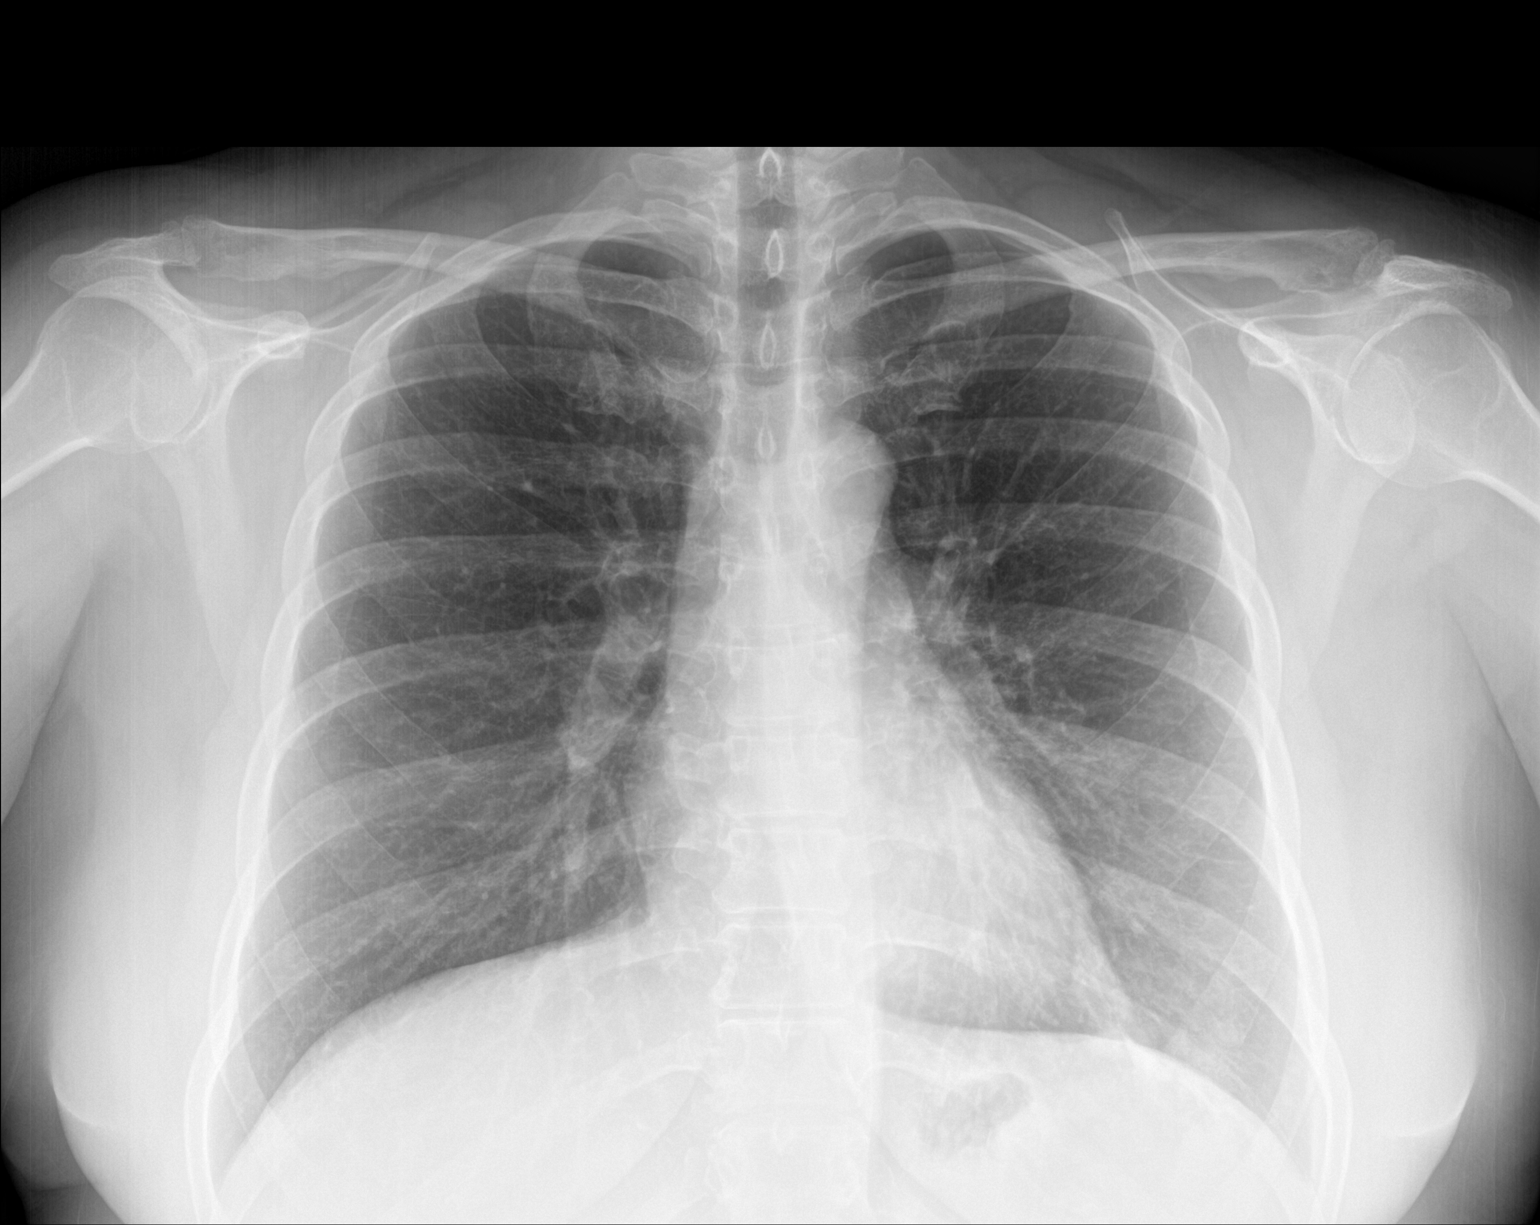

[chest lat]
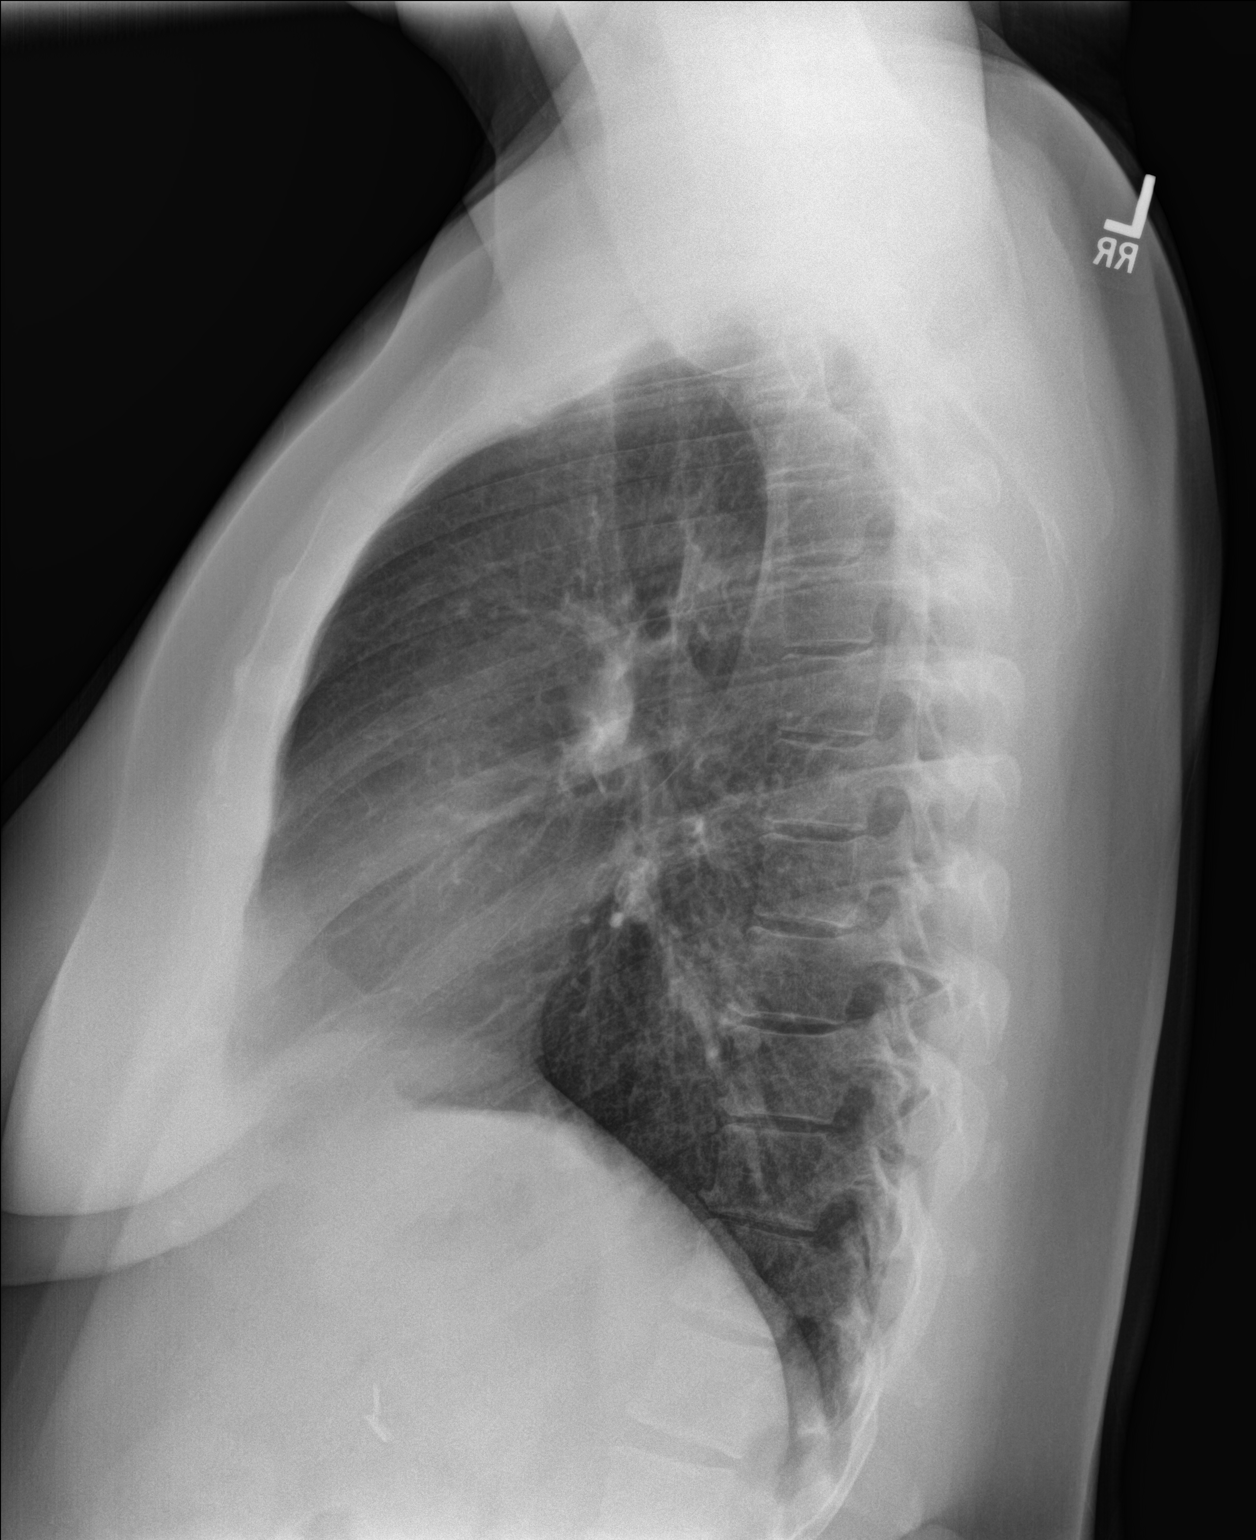

[2 of 2 positions shown; findings below may reference images not displayed]

FINDINGS: Heart size is normal. There is minimal patchy density at the LEFT
lung base, consistent with infectious infiltrate. No pulmonary
edema. Surgical clips are present in the UPPER abdomen.
IMPRESSION: Focal LEFT LOWER lobe infiltrate. Followup PA and lateral chest
X-ray is recommended in 3-4 weeks following trial of antibiotic
therapy to ensure resolution and exclude underlying malignancy.

## 2019-01-10 ENCOUNTER — Encounter: Payer: BLUE CROSS/BLUE SHIELD | Admitting: Family Medicine

## 2019-04-10 ENCOUNTER — Encounter: Payer: Self-pay | Admitting: Family Medicine

## 2019-04-10 ENCOUNTER — Other Ambulatory Visit: Payer: Self-pay

## 2019-04-10 ENCOUNTER — Ambulatory Visit: Payer: BLUE CROSS/BLUE SHIELD | Admitting: Family Medicine

## 2019-04-10 VITALS — BP 127/85 | HR 91 | Temp 98.5°F | Resp 14 | Wt 216.4 lb

## 2019-04-10 DIAGNOSIS — E039 Hypothyroidism, unspecified: Secondary | ICD-10-CM

## 2019-04-10 DIAGNOSIS — K219 Gastro-esophageal reflux disease without esophagitis: Secondary | ICD-10-CM | POA: Diagnosis not present

## 2019-04-10 DIAGNOSIS — G8929 Other chronic pain: Secondary | ICD-10-CM

## 2019-04-10 DIAGNOSIS — F411 Generalized anxiety disorder: Secondary | ICD-10-CM

## 2019-04-10 DIAGNOSIS — Z13220 Encounter for screening for lipoid disorders: Secondary | ICD-10-CM

## 2019-04-10 DIAGNOSIS — J309 Allergic rhinitis, unspecified: Secondary | ICD-10-CM

## 2019-04-10 DIAGNOSIS — Z114 Encounter for screening for human immunodeficiency virus [HIV]: Secondary | ICD-10-CM

## 2019-04-10 DIAGNOSIS — M545 Low back pain: Secondary | ICD-10-CM

## 2019-04-10 MED ORDER — MONTELUKAST SODIUM 10 MG PO TABS: ORAL_TABLET | ORAL | 3 refills | Status: DC

## 2019-04-10 MED ORDER — LEVOTHYROXINE SODIUM 50 MCG PO TABS: 50.0000 ug | ORAL_TABLET | Freq: Every day | ORAL | 3 refills | Status: DC

## 2019-04-10 MED ORDER — MELOXICAM 15 MG PO TABS: 15.0000 mg | ORAL_TABLET | Freq: Every day | ORAL | 0 refills | Status: DC | PRN

## 2019-04-10 MED ORDER — FLUOXETINE HCL 10 MG PO TABS: 10.0000 mg | ORAL_TABLET | Freq: Every day | ORAL | 3 refills | Status: DC

## 2019-04-10 MED ORDER — OMEPRAZOLE 20 MG PO CPDR: 20.0000 mg | DELAYED_RELEASE_CAPSULE | Freq: Every day | ORAL | 1 refills | Status: DC | PRN

## 2019-04-10 NOTE — Progress Notes (Signed)
Subjective:    Patient ID: Meagan [name], female    DOB: Aug 28, 1976, 43 y.o.   MRN: [id number]  HPI Meagan [name] is a 43 y.o. female Presents today for: Chief Complaint  Patient presents with  . Establish Care    Patient need refill on medication levothyroxin, mobic, singulair  . hsv    would like a medication for cold sore   Here to establish care, prior pt of Dr. Brigitte Pulse.   HSV?  Husband with cold sores, thinks he gave it to her - thought she may have had infection in back of throat about a week  Ago - only sore for 48 hrs, then resolved - no further sx's and no blisters/rash. No prior fever blister.   Hypothyroidism: Lab Results  Component Value Date   TSH 2.860 02/24/2018  prior endocrinology - Dr. Chalmers Cater.  Synthroid 38mcg QD,  No new skin/hair changes. Weight gain with decreased exercise with Covid. No heart palpitations.  Marrianne Mood worked well prior for exercise . Has home exercise option.   Allergic Rhinitis: singulair 10mg  qd - working well.  flonase prior if needed - less nosebleeds with improved technique.   Generalized Anxiety: Controlled on prozac 10mg  qd.  Able to manage anxiety if flares - five senses works well.   GERD Improved after weight loss, some increased weight since last visit - needing more. 3 days per week now - relief with omeprazole.  Has had some bile reflux? Since gallbladder surgery - 2015.   Back pain: Bulging disc, helps with exercise and losing weight. mobic daily prior - ran out 1 month ago.  strength training and stretches have helped manage as well.   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 Married 6/20.  Patient Active Problem List   Diagnosis Date Noted  . Bilateral knee pain 03/17/2018  . Gastroesophageal reflux disease 03/02/2018  . Seasonal allergic rhinitis 03/02/2018  . Acquired hypothyroidism 03/02/2018  . RLD (ruptured lumbar disc) 03/02/2018  . Generalized anxiety disorder 09/03/2017  . Obesity 09/03/2017  . OSA on  CPAP 02/09/2015  . Uncontrolled narcolepsy 04/03/2013  . Hypersomnia, persistent 01/11/2013   Past Medical History:  Diagnosis Date  . Acquired hypothyroidism 03/02/2018  . Allergy    Seasonal  . Anxiety   . Complication of anesthesia    post -op nausea and vomiting at age 23  . Constipation, chronic   . Depression   . Family history of anesthesia complication    cousin woke-up during procedure  . GERD (gastroesophageal reflux disease)   . Narcolepsy   . Ruptured lumbar disc    L5  . Sleep apnea    wears CPAP nightly  . Sleep paralysis    at times  . 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 Meanwhile  Medications have become ineffective and patient gained 100 pounds, developed OSA, now on CPAP 9 cm, MSLT to follow in 04-19-13.   . Vivid dream    Past Surgical History:  Procedure Laterality Date  . CHOLECYSTECTOMY N/A 01/01/2014   Procedure: LAPAROSCOPIC CHOLECYSTECTOMY WITH INTRAOPERATIVE CHOLANGIOGRAM;  Surgeon: Imogene Burn. Georgette Dover, MD;  Location: [facility];  Service: General;  Laterality: N/A;  . TONSILLECTOMY AND ADENOIDECTOMY  1981   age 61  . VAGINAL DELIVERY     Allergies  Allergen Reactions  . Amoxicillin Itching and Rash   Prior to Admission medications   Medication Sig Start Date End Date Taking? Authorizing Provider  desogestrel-ethinyl  estradiol (KARIVA,AZURETTE,MIRCETTE) 0.15-0.02/0.01 MG (21/5) tablet Take 1 tablet by mouth daily.   Yes [provider]  FLUoxetine (PROZAC) 10 MG tablet Take 1 tablet (10 mg total) by mouth daily. 07/13/18  Yes Sherren MochaShaw, Eva N, MD  levothyroxine (SYNTHROID, LEVOTHROID) 50 MCG tablet Take 1 tablet (50 mcg total) by mouth daily. 03/01/18  Yes Sherren MochaShaw, Eva N, MD  meloxicam (MOBIC) 15 MG tablet Take 1 tablet (15 mg total) by mouth daily as needed for pain. 02/24/18  Yes Sherren MochaShaw, Eva N, MD  montelukast (SINGULAIR) 10 MG tablet TAKE 1 TABLET (10 MG TOTAL) BY MOUTH AT BEDTIME. 02/24/18  Yes Sherren MochaShaw, Eva N, MD  Multiple  Vitamins-Minerals (MULTIVITAMIN ADULT PO) multivitamin   Yes [provider]  omeprazole (PRILOSEC) 20 MG capsule TAKE 1 CAPSULE BY MOUTH EVERY DAY Patient taking differently: Take 20 mg by mouth daily as needed.  10/18/17  Yes Sherren MochaShaw, Eva N, MD   Social History   Socioeconomic History  . Marital status: Single    Spouse name: Not on file  . Number of children: 1  . Years of education: some col.  . Highest education level: Not on file  Occupational History    Employer: [employer]    Comment: [employer]  Social Needs  . Financial resource strain: Not on file  . Food insecurity    Worry: Not on file    Inability: Not on file  . Transportation needs    Medical: Not on file    Non-medical: Not on file  Tobacco Use  . Smoking status: Former Smoker    Start date: 09/05/2001  . Smokeless tobacco: Never Used  . Tobacco comment: 10 years ago  Substance and Sexual Activity  . Alcohol use: Yes    Comment: occasionally,one 3-7 oz of wine per week  . Drug use: No  . Sexual activity: Not Currently    Birth control/protection: Pill    Comment: kariva  Lifestyle  . Physical activity    Days per week: Not on file    Minutes per session: Not on file  . Stress: Not on file  Relationships  . Social Musicianconnections    Talks on phone: Not on file    Gets together: Not on file    Attends religious service: Not on file    Active member of club or organization: Not on file    Attends meetings of clubs or organizations: Not on file    Relationship status: Not on file  . Intimate partner violence    Fear of current or ex partner: Not on file    Emotionally abused: Not on file    Physically abused: Not on file    Forced sexual activity: Not on file  Other Topics Concern  . Not on file  Social History Narrative   Patient is single and lives at home, her daughter lives with her.   Patient has one child.   Patient is working full-time.   Patient has some college  education.   Patient is left handed.   Patient does not drink any caffeine.    Review of Systems Per HPI.     Objective:   Physical Exam Vitals signs reviewed.  Constitutional:      Appearance: She is well-developed.  HENT:     Head: Normocephalic and atraumatic.  Eyes:     Conjunctiva/sclera: Conjunctivae normal.     Pupils: Pupils are equal, round, and reactive to light.  Neck:  Vascular: No carotid bruit.  Cardiovascular:     Rate and Rhythm: Normal rate and regular rhythm.     Heart sounds: Normal heart sounds.  Pulmonary:     Effort: Pulmonary effort is normal.     Breath sounds: Normal breath sounds.  Abdominal:     Palpations: Abdomen is soft. There is no pulsatile mass.     Tenderness: There is no abdominal tenderness.  Skin:    General: Skin is warm and dry.  Neurological:     Mental Status: She is alert and oriented to person, place, and time.  Psychiatric:        Behavior: Behavior normal.    Vitals:   04/10/19 1626  BP: 127/85  Pulse: 91  Resp: 14  Temp: 98.5 F (36.9 C)  TempSrc: Oral  SpO2: 97%  Weight: 216 lb 6.4 oz (98.2 kg)       Assessment & Plan:  .  Jonette MateJoy K Sloop is a 43 y.o. female Hypothyroidism, unspecified type - Plan: levothyroxine (SYNTHROID) 50 MCG tablet, TSH  -Check TSH, continue same dose Synthroid for now  Gastroesophageal reflux disease without esophagitis - Plan: omeprazole (PRILOSEC) 20 MG capsule  -Trigger avoidance, omeprazole as needed up to once per day but consider B12/vitamin D/ferritin  monitoring if daily need.  Chronic low back pain, unspecified back pain laterality, unspecified whether sciatica present - Plan: meloxicam (MOBIC) 15 MG tablet  -Potential short-term/long-term risks of NSAIDs and Mobic discussed.  Refilled for now, but plan to follow-up in the next 6 weeks to discuss previous treatments, future treatment options in more detail.  Screening for hyperlipidemia - Plan: Comprehensive metabolic panel,  Lipid Panel  Screening for HIV (human immunodeficiency virus) - Plan: HIV Antibody (routine testing w rflx)  Generalized anxiety disorder - Plan: FLUoxetine (PROZAC) 10 MG tablet  -Stable with low-dose fluoxetine, continue same.  Allergic rhinitis, unspecified seasonality, unspecified trigger - Plan: montelukast (SINGULAIR) 10 MG tablet  -Overall stable with use of Singulair, refilled  Meds ordered this encounter  Medications  . levothyroxine (SYNTHROID) 50 MCG tablet    Sig: Take 1 tablet (50 mcg total) by mouth daily.    Dispense:  90 tablet    Refill:  3  . meloxicam (MOBIC) 15 MG tablet    Sig: Take 1 tablet (15 mg total) by mouth daily as needed for pain.    Dispense:  90 tablet    Refill:  0  . montelukast (SINGULAIR) 10 MG tablet    Sig: TAKE 1 TABLET (10 MG TOTAL) BY MOUTH AT BEDTIME.    Dispense:  90 tablet    Refill:  3  . omeprazole (PRILOSEC) 20 MG capsule    Sig: Take 1 capsule (20 mg total) by mouth daily as needed.    Dispense:  90 capsule    Refill:  1  . FLUoxetine (PROZAC) 10 MG tablet    Sig: Take 1 tablet (10 mg total) by mouth daily.    Dispense:  90 tablet    Refill:  3   Patient Instructions   No med changes today, but follow up in 6 weeks to discuss back pain. Mobic if needed for now.    Food Choices for Gastroesophageal Reflux Disease, Adult When you have gastroesophageal reflux disease (GERD), the foods you eat and your eating habits are very important. Choosing the right foods can help ease your discomfort. Think about working with a nutrition specialist (dietitian) to help you make good choices. What are tips  for following this plan?  Meals  Choose healthy foods that are low in fat, such as fruits, vegetables, whole grains, low-fat dairy products, and lean meat, fish, and poultry.  Eat small meals often instead of 3 large meals a day. Eat your meals slowly, and in a place where you are relaxed. Avoid bending over or lying down until 2-3 hours  after eating.  Avoid eating meals 2-3 hours before bed.  Avoid drinking a lot of liquid with meals.  Cook foods using methods other than frying. Bake, grill, or broil food instead.  Avoid or limit: ? Chocolate. ? Peppermint or spearmint. ? Alcohol. ? Pepper. ? Black and decaffeinated coffee. ? Black and decaffeinated tea. ? Bubbly (carbonated) soft drinks. ? Caffeinated energy drinks and soft drinks.  Limit high-fat foods such as: ? Fatty meat or fried foods. ? Whole milk, cream, butter, or ice cream. ? Nuts and nut butters. ? Pastries, donuts, and sweets made with butter or shortening.  Avoid foods that cause symptoms. These foods may be different for everyone. Common foods that cause symptoms include: ? Tomatoes. ? Oranges, lemons, and limes. ? Peppers. ? Spicy food. ? Onions and garlic. ? Vinegar. Lifestyle  Maintain a healthy weight. Ask your doctor what weight is healthy for you. If you need to lose weight, work with your doctor to do so safely.  Exercise for at least 30 minutes for 5 or more days each week, or as told by your doctor.  Wear loose-fitting clothes.  Do not smoke. If you need help quitting, ask your doctor.  Sleep with the head of your bed higher than your feet. Use a wedge under the mattress or blocks under the bed frame to raise the head of the bed. Summary  When you have gastroesophageal reflux disease (GERD), food and lifestyle choices are very important in easing your symptoms.  Eat small meals often instead of 3 large meals a day. Eat your meals slowly, and in a place where you are relaxed.  Limit high-fat foods such as fatty meat or fried foods.  Avoid bending over or lying down until 2-3 hours after eating.  Avoid peppermint and spearmint, caffeine, alcohol, and chocolate. This information is not intended to replace advice given to you by your health care provider. Make sure you discuss any questions you have with your health care  provider. Document Released: 02/21/2012 Document Revised: 12/13/2018 Document Reviewed: 09/27/2016 Elsevier Patient Education  2020 Elsevier Inc.    Living With Anxiety  After being diagnosed with an anxiety disorder, you may be relieved to know why you have felt or behaved a certain way. It is natural to also feel overwhelmed about the treatment ahead and what it will mean for your life. With care and support, you can manage this condition and recover from it. How to cope with anxiety Dealing with stress Stress is your body's reaction to life changes and events, both good and bad. Stress can last just a few hours or it can be ongoing. Stress can play a major role in anxiety, so it is important to learn both how to cope with stress and how to think about it differently. Talk with your health care provider or a counselor to learn more about stress reduction. He or she may suggest some stress reduction techniques, such as:  Music therapy. This can include creating or listening to music that you enjoy and that inspires you.  Mindfulness-based meditation. This involves being aware of your normal breaths,  rather than trying to control your breathing. It can be done while sitting or walking.  Centering prayer. This is a kind of meditation that involves focusing on a word, phrase, or sacred image that is meaningful to you and that brings you peace.  Deep breathing. To do this, expand your stomach and inhale slowly through your nose. Hold your breath for 3-5 seconds. Then exhale slowly, allowing your stomach muscles to relax.  Self-talk. This is a skill where you identify thought patterns that lead to anxiety reactions and correct those thoughts.  Muscle relaxation. This involves tensing muscles then relaxing them. Choose a stress reduction technique that fits your lifestyle and personality. Stress reduction techniques take time and practice. Set aside 5-15 minutes a day to do them. Therapists can  offer training in these techniques. The training may be covered by some insurance plans. Other things you can do to manage stress include:  Keeping a stress diary. This can help you learn what triggers your stress and ways to control your response.  Thinking about how you respond to certain situations. You may not be able to control everything, but you can control your reaction.  Making time for activities that help you relax, and not feeling guilty about spending your time in this way. Therapy combined with coping and stress-reduction skills provides the best chance for successful treatment. Medicines Medicines can help ease symptoms. Medicines for anxiety include:  Anti-anxiety drugs.  Antidepressants.  Beta-blockers. Medicines may be used as the main treatment for anxiety disorder, along with therapy, or if other treatments are not working. Medicines should be prescribed by a health care provider. Relationships Relationships can play a big part in helping you recover. Try to spend more time connecting with trusted friends and family members. Consider going to couples counseling, taking family education classes, or going to family therapy. Therapy can help you and others better understand the condition. How to recognize changes in your condition Everyone has a different response to treatment for anxiety. Recovery from anxiety happens when symptoms decrease and stop interfering with your daily activities at home or work. This may mean that you will start to:  Have better concentration and focus.  Sleep better.  Be less irritable.  Have more energy.  Have improved memory. It is important to recognize when your condition is getting worse. Contact your health care provider if your symptoms interfere with home or work and you do not feel like your condition is improving. Where to find help and support: You can get help and support from these sources:  Self-help groups.  Online and  [employer].  A trusted spiritual leader.  Couples counseling.  Family education classes.  Family therapy. Follow these instructions at home:  Eat a healthy diet that includes plenty of vegetables, fruits, whole grains, low-fat dairy products, and lean protein. Do not eat a lot of foods that are high in solid fats, added sugars, or salt.  Exercise. Most adults should do the following: ? Exercise for at least 150 minutes each week. The exercise should increase your heart rate and make you sweat (moderate-intensity exercise). ? Strengthening exercises at least twice a week.  Cut down on caffeine, tobacco, alcohol, and other potentially harmful substances.  Get the right amount and quality of sleep. Most adults need 7-9 hours of sleep each night.  Make choices that simplify your life.  Take over-the-counter and prescription medicines only as told by your health care provider.  Avoid caffeine, alcohol, and certain  over-the-counter cold medicines. These may make you feel worse. Ask your pharmacist which medicines to avoid.  Keep all follow-up visits as told by your health care provider. This is important. Questions to ask your health care provider  Would I benefit from therapy?  How often should I follow up with a health care provider?  How long do I need to take medicine?  Are there any long-term side effects of my medicine?  Are there any alternatives to taking medicine? Contact a health care provider if:  You have a hard time staying focused or finishing daily tasks.  You spend many hours a day feeling worried about everyday life.  You become exhausted by worry.  You start to have headaches, feel tense, or have nausea.  You urinate more than normal.  You have diarrhea. Get help right away if:  You have a racing heart and shortness of breath.  You have thoughts of hurting yourself or others. If you ever feel like you may hurt yourself or others, or  have thoughts about taking your own life, get help right away. You can go to your nearest emergency department or call:  Your local emergency services (911 in the [place]  A suicide crisis helpline, such as the National Suicide Prevention Lifeline at 765-501-87011-937 186 0858. This is open 24-hours a day. Summary  Taking steps to deal with stress can help calm you.  Medicines cannot cure anxiety disorders, but they can help ease symptoms.  Family, friends, and partners can play a big part in helping you recover from an anxiety disorder. This information is not intended to replace advice given to you by your health care provider. Make sure you discuss any questions you have with your health care provider. Document Released: 08/16/2016 Document Revised: 08/04/2017 Document Reviewed: 08/16/2016 Elsevier Patient Education  [employer]2020 Elsevier Inc.    If you have lab work done today you will be contacted with your lab results within the next 2 weeks.  If you have not heard from us then please contact us. The fastest way to get your results is to register for My Chart.   IF you received an x-ray today, you will receive an invoice from [facility]. Please contact [facility] at [phone] with questions or concerns regarding your invoice.   IF you received labwork today, you will receive an invoice from [facility]. Please contact [facility] at [phone] with questions or concerns regarding your invoice.   Our billing staff will not be able to assist you with questions regarding bills from these companies.  You will be contacted with the lab results as soon as they are available. The fastest way to get your results is to activate your My Chart account. Instructions are located on the last page of this paperwork. If you have not heard from us regarding the results in 2 weeks, please contact this office.       Signed,   Meredith StaggersJeffrey Cheryllynn Sarff, MD [facility].   04/14/19 1:45 PM

## 2019-04-10 NOTE — Patient Instructions (Addendum)
No med changes today, but follow up in 6 weeks to discuss back pain. Mobic if needed for now.    Food Choices for Gastroesophageal Reflux Disease, Adult When you have gastroesophageal reflux disease (GERD), the foods you eat and your eating habits are very important. Choosing the right foods can help ease your discomfort. Think about working with a nutrition specialist (dietitian) to help you make good choices. What are tips for following this plan?  Meals  Choose healthy foods that are low in fat, such as fruits, vegetables, whole grains, low-fat dairy products, and lean meat, fish, and poultry.  Eat small meals often instead of 3 large meals a day. Eat your meals slowly, and in a place where you are relaxed. Avoid bending over or lying down until 2-3 hours after eating.  Avoid eating meals 2-3 hours before bed.  Avoid drinking a lot of liquid with meals.  Cook foods using methods other than frying. Bake, grill, or broil food instead.  Avoid or limit: ? Chocolate. ? Peppermint or spearmint. ? Alcohol. ? Pepper. ? Black and decaffeinated coffee. ? Black and decaffeinated tea. ? Bubbly (carbonated) soft drinks. ? Caffeinated energy drinks and soft drinks.  Limit high-fat foods such as: ? Fatty meat or fried foods. ? Whole milk, cream, butter, or ice cream. ? Nuts and nut butters. ? Pastries, donuts, and sweets made with butter or shortening.  Avoid foods that cause symptoms. These foods may be different for everyone. Common foods that cause symptoms include: ? Tomatoes. ? Oranges, lemons, and limes. ? Peppers. ? Spicy food. ? Onions and garlic. ? Vinegar. Lifestyle  Maintain a healthy weight. Ask your doctor what weight is healthy for you. If you need to lose weight, work with your doctor to do so safely.  Exercise for at least 30 minutes for 5 or more days each week, or as told by your doctor.  Wear loose-fitting clothes.  Do not smoke. If you need help quitting, ask  your doctor.  Sleep with the head of your bed higher than your feet. Use a wedge under the mattress or blocks under the bed frame to raise the head of the bed. Summary  When you have gastroesophageal reflux disease (GERD), food and lifestyle choices are very important in easing your symptoms.  Eat small meals often instead of 3 large meals a day. Eat your meals slowly, and in a place where you are relaxed.  Limit high-fat foods such as fatty meat or fried foods.  Avoid bending over or lying down until 2-3 hours after eating.  Avoid peppermint and spearmint, caffeine, alcohol, and chocolate. This information is not intended to replace advice given to you by your health care provider. Make sure you discuss any questions you have with your health care provider. Document Released: 02/21/2012 Document Revised: 12/13/2018 Document Reviewed: 09/27/2016 Elsevier Patient Education  2020 Elsevier Inc.    Living With Anxiety  After being diagnosed with an anxiety disorder, you may be relieved to know why you have felt or behaved a certain way. It is natural to also feel overwhelmed about the treatment ahead and what it will mean for your life. With care and support, you can manage this condition and recover from it. How to cope with anxiety Dealing with stress Stress is your body's reaction to life changes and events, both good and bad. Stress can last just a few hours or it can be ongoing. Stress can play a major role in anxiety, so it  is important to learn both how to cope with stress and how to think about it differently. Talk with your health care provider or a counselor to learn more about stress reduction. He or she may suggest some stress reduction techniques, such as:  Music therapy. This can include creating or listening to music that you enjoy and that inspires you.  Mindfulness-based meditation. This involves being aware of your normal breaths, rather than trying to control your  breathing. It can be done while sitting or walking.  Centering prayer. This is a kind of meditation that involves focusing on a word, phrase, or sacred image that is meaningful to you and that brings you peace.  Deep breathing. To do this, expand your stomach and inhale slowly through your nose. Hold your breath for 3-5 seconds. Then exhale slowly, allowing your stomach muscles to relax.  Self-talk. This is a skill where you identify thought patterns that lead to anxiety reactions and correct those thoughts.  Muscle relaxation. This involves tensing muscles then relaxing them. Choose a stress reduction technique that fits your lifestyle and personality. Stress reduction techniques take time and practice. Set aside 5-15 minutes a day to do them. Therapists can offer training in these techniques. The training may be covered by some insurance plans. Other things you can do to manage stress include:  Keeping a stress diary. This can help you learn what triggers your stress and ways to control your response.  Thinking about how you respond to certain situations. You may not be able to control everything, but you can control your reaction.  Making time for activities that help you relax, and not feeling guilty about spending your time in this way. Therapy combined with coping and stress-reduction skills provides the best chance for successful treatment. Medicines Medicines can help ease symptoms. Medicines for anxiety include:  Anti-anxiety drugs.  Antidepressants.  Beta-blockers. Medicines may be used as the main treatment for anxiety disorder, along with therapy, or if other treatments are not working. Medicines should be prescribed by a health care provider. Relationships Relationships can play a big part in helping you recover. Try to spend more time connecting with trusted friends and family members. Consider going to couples counseling, taking family education classes, or going to family  therapy. Therapy can help you and others better understand the condition. How to recognize changes in your condition Everyone has a different response to treatment for anxiety. Recovery from anxiety happens when symptoms decrease and stop interfering with your daily activities at home or work. This may mean that you will start to:  Have better concentration and focus.  Sleep better.  Be less irritable.  Have more energy.  Have improved memory. It is important to recognize when your condition is getting worse. Contact your health care provider if your symptoms interfere with home or work and you do not feel like your condition is improving. Where to find help and support: You can get help and support from these sources:  Self-help groups.  Online and [employer].  A trusted spiritual leader.  Couples counseling.  Family education classes.  Family therapy. Follow these instructions at home:  Eat a healthy diet that includes plenty of vegetables, fruits, whole grains, low-fat dairy products, and lean protein. Do not eat a lot of foods that are high in solid fats, added sugars, or salt.  Exercise. Most adults should do the following: ? Exercise for at least 150 minutes each week. The exercise should increase your heart  rate and make you sweat (moderate-intensity exercise). ? Strengthening exercises at least twice a week.  Cut down on caffeine, tobacco, alcohol, and other potentially harmful substances.  Get the right amount and quality of sleep. Most adults need 7-9 hours of sleep each night.  Make choices that simplify your life.  Take over-the-counter and prescription medicines only as told by your health care provider.  Avoid caffeine, alcohol, and certain over-the-counter cold medicines. These may make you feel worse. Ask your pharmacist which medicines to avoid.  Keep all follow-up visits as told by your health care provider. This is important. Questions to  ask your health care provider  Would I benefit from therapy?  How often should I follow up with a health care provider?  How long do I need to take medicine?  Are there any long-term side effects of my medicine?  Are there any alternatives to taking medicine? Contact a health care provider if:  You have a hard time staying focused or finishing daily tasks.  You spend many hours a day feeling worried about everyday life.  You become exhausted by worry.  You start to have headaches, feel tense, or have nausea.  You urinate more than normal.  You have diarrhea. Get help right away if:  You have a racing heart and shortness of breath.  You have thoughts of hurting yourself or others. If you ever feel like you may hurt yourself or others, or have thoughts about taking your own life, get help right away. You can go to your nearest emergency department or call:  Your local emergency services (911 in the [place]  A suicide crisis helpline, such as the National Suicide Prevention Lifeline at 442-515-45071-(954)059-5606. This is open 24-hours a day. Summary  Taking steps to deal with stress can help calm you.  Medicines cannot cure anxiety disorders, but they can help ease symptoms.  Family, friends, and partners can play a big part in helping you recover from an anxiety disorder. This information is not intended to replace advice given to you by your health care provider. Make sure you discuss any questions you have with your health care provider. Document Released: 08/16/2016 Document Revised: 08/04/2017 Document Reviewed: 08/16/2016 Elsevier Patient Education  [employer]2020 Else[employer] Inc.    If you have lab work done today you will be contacted with your lab results within the next 2 weeks.  If you have not heard from us then please contact us. The fastest way to get your results is to register for My Chart.   IF you received an x-ray today, you will receive an invoice from [facility].  Please contact [facility] at [phone] with questions or concerns regarding your invoice.   IF you received labwork today, you will receive an invoice from [facility]. Please contact [facility] at [phone] with questions or concerns regarding your invoice.   Our billing staff will not be able to assist you with questions regarding bills from these companies.  You will be contacted with the lab results as soon as they are available. The fastest way to get your results is to activate your My Chart account. Instructions are located on the last page of this paperwork. If you have not heard from us regarding the results in 2 weeks, please contact this office.

## 2019-04-11 DIAGNOSIS — N926 Irregular menstruation, unspecified: Secondary | ICD-10-CM | POA: Diagnosis not present

## 2019-04-14 ENCOUNTER — Encounter: Payer: Self-pay | Admitting: Family Medicine

## 2019-04-22 ENCOUNTER — Encounter: Payer: Self-pay | Admitting: Family Medicine

## 2019-04-24 ENCOUNTER — Other Ambulatory Visit: Payer: Self-pay

## 2019-04-24 DIAGNOSIS — Z20822 Contact with and (suspected) exposure to covid-19: Secondary | ICD-10-CM

## 2019-04-25 LAB — NOVEL CORONAVIRUS, NAA: SARS-CoV-2, NAA: NOT DETECTED

## 2019-05-22 ENCOUNTER — Ambulatory Visit: Payer: BLUE CROSS/BLUE SHIELD | Admitting: Family Medicine

## 2019-05-28 DIAGNOSIS — N941 Unspecified dyspareunia: Secondary | ICD-10-CM | POA: Diagnosis not present

## 2019-06-12 ENCOUNTER — Other Ambulatory Visit: Payer: Self-pay

## 2019-06-12 DIAGNOSIS — Z20828 Contact with and (suspected) exposure to other viral communicable diseases: Secondary | ICD-10-CM | POA: Diagnosis not present

## 2019-06-12 DIAGNOSIS — Z20822 Contact with and (suspected) exposure to covid-19: Secondary | ICD-10-CM

## 2019-06-14 LAB — NOVEL CORONAVIRUS, NAA: SARS-CoV-2, NAA: NOT DETECTED

## 2019-08-23 DIAGNOSIS — R05 Cough: Secondary | ICD-10-CM | POA: Diagnosis not present

## 2019-08-23 DIAGNOSIS — Z20828 Contact with and (suspected) exposure to other viral communicable diseases: Secondary | ICD-10-CM | POA: Diagnosis not present

## 2019-08-25 ENCOUNTER — Encounter: Payer: Self-pay | Admitting: Family Medicine

## 2019-09-26 DIAGNOSIS — F411 Generalized anxiety disorder: Secondary | ICD-10-CM | POA: Diagnosis not present

## 2019-10-01 ENCOUNTER — Other Ambulatory Visit: Payer: Self-pay

## 2019-10-01 ENCOUNTER — Telehealth: Payer: Self-pay | Admitting: Adult Health Nurse Practitioner

## 2019-10-01 NOTE — Patient Instructions (Signed)
° ° ° °  If you have lab work done today you will be contacted with your lab results within the next 2 weeks.  If you have not heard from us then please contact us. The fastest way to get your results is to register for My Chart. ° ° °IF you received an x-ray today, you will receive an invoice from [facility]. Please contact  Radiology at [phone] with questions or concerns regarding your invoice.  ° °IF you received labwork today, you will receive an invoice from [facility]. Please contact [facility] at [phone] with questions or concerns regarding your invoice.  ° °Our billing staff will not be able to assist you with questions regarding bills from these companies. ° °You will be contacted with the lab results as soon as they are available. The fastest way to get your results is to activate your My Chart account. Instructions are located on the last page of this paperwork. If you have not heard from us regarding the results in 2 weeks, please contact this office. °  ° ° ° °

## 2019-10-04 ENCOUNTER — Encounter: Payer: Self-pay | Admitting: Family Medicine

## 2019-10-04 ENCOUNTER — Encounter: Payer: Self-pay | Admitting: Adult Health Nurse Practitioner

## 2019-10-05 ENCOUNTER — Other Ambulatory Visit: Payer: Self-pay

## 2019-10-05 DIAGNOSIS — Z20822 Contact with and (suspected) exposure to covid-19: Secondary | ICD-10-CM

## 2019-10-06 LAB — NOVEL CORONAVIRUS, NAA: SARS-CoV-2, NAA: NOT DETECTED

## 2019-10-08 DIAGNOSIS — F411 Generalized anxiety disorder: Secondary | ICD-10-CM | POA: Diagnosis not present

## 2019-10-10 ENCOUNTER — Other Ambulatory Visit: Payer: Self-pay | Admitting: Family Medicine

## 2019-10-10 DIAGNOSIS — K219 Gastro-esophageal reflux disease without esophagitis: Secondary | ICD-10-CM

## 2019-10-11 DIAGNOSIS — Z01419 Encounter for gynecological examination (general) (routine) without abnormal findings: Secondary | ICD-10-CM | POA: Diagnosis not present

## 2019-10-11 DIAGNOSIS — Z6841 Body Mass Index (BMI) 40.0 and over, adult: Secondary | ICD-10-CM | POA: Diagnosis not present

## 2019-10-11 DIAGNOSIS — Z1231 Encounter for screening mammogram for malignant neoplasm of breast: Secondary | ICD-10-CM | POA: Diagnosis not present

## 2019-10-11 DIAGNOSIS — N9411 Superficial (introital) dyspareunia: Secondary | ICD-10-CM | POA: Diagnosis not present

## 2019-10-11 DIAGNOSIS — N898 Other specified noninflammatory disorders of vagina: Secondary | ICD-10-CM | POA: Diagnosis not present

## 2019-11-04 DIAGNOSIS — F411 Generalized anxiety disorder: Secondary | ICD-10-CM | POA: Diagnosis not present

## 2019-11-14 DIAGNOSIS — F411 Generalized anxiety disorder: Secondary | ICD-10-CM | POA: Diagnosis not present

## 2019-11-21 DIAGNOSIS — F411 Generalized anxiety disorder: Secondary | ICD-10-CM | POA: Diagnosis not present

## 2019-12-05 DIAGNOSIS — F411 Generalized anxiety disorder: Secondary | ICD-10-CM | POA: Diagnosis not present

## 2019-12-12 DIAGNOSIS — F411 Generalized anxiety disorder: Secondary | ICD-10-CM | POA: Diagnosis not present

## 2019-12-19 DIAGNOSIS — F411 Generalized anxiety disorder: Secondary | ICD-10-CM | POA: Diagnosis not present

## 2019-12-31 ENCOUNTER — Ambulatory Visit: Payer: BC Managed Care – PPO | Admitting: Adult Health

## 2020-01-02 DIAGNOSIS — F411 Generalized anxiety disorder: Secondary | ICD-10-CM | POA: Diagnosis not present

## 2020-01-08 ENCOUNTER — Other Ambulatory Visit: Payer: Self-pay | Admitting: Family Medicine

## 2020-01-08 DIAGNOSIS — F411 Generalized anxiety disorder: Secondary | ICD-10-CM

## 2020-01-08 NOTE — Telephone Encounter (Signed)
Requested medication (s) are due for refill today:   Yes  Requested medication (s) are on the active medication list:   Yes  Future visit scheduled:   No   Last ordered: 04/10/2019  #90  3 refills  Clinic note:   Returned because does not have a valid encounter within the last 6 months.      Requested Prescriptions  Pending Prescriptions Disp Refills   FLUoxetine (PROZAC) 10 MG tablet [Pharmacy Med Name: FLUOXETINE HCL 10 MG TABLET] 90 tablet 3    Sig: TAKE 1 TABLET BY MOUTH EVERY DAY      Psychiatry:  Antidepressants - SSRI Failed - 01/08/2020  2:31 PM      Failed - Valid encounter within last 6 months    Recent Outpatient Visits           3 months ago    [facility] at [facility], Lonna Cobb, NP   9 months ago Hypothyroidism, unspecified type   [facility] at [facility], Asencion Partridge, MD   1 year ago Amenorrhea   [facility], Levell July, MD   1 year ago Plantar wart   [facility], Levell July, MD   1 year ago Tick bite of abdomen, initial encounter   [facility] at [facility], Asencion Partridge, MD

## 2020-01-09 NOTE — Telephone Encounter (Signed)
No further refills without office visit 

## 2020-01-14 NOTE — Telephone Encounter (Signed)
Called pt and made appt for med refill on 02/05/20

## 2020-01-16 DIAGNOSIS — F411 Generalized anxiety disorder: Secondary | ICD-10-CM | POA: Diagnosis not present

## 2020-01-23 DIAGNOSIS — F411 Generalized anxiety disorder: Secondary | ICD-10-CM | POA: Diagnosis not present

## 2020-02-05 ENCOUNTER — Ambulatory Visit: Payer: BC Managed Care – PPO | Admitting: Family Medicine

## 2020-02-05 ENCOUNTER — Encounter: Payer: Self-pay | Admitting: Family Medicine

## 2020-02-05 ENCOUNTER — Other Ambulatory Visit: Payer: Self-pay | Admitting: Family Medicine

## 2020-02-05 ENCOUNTER — Other Ambulatory Visit: Payer: Self-pay

## 2020-02-05 VITALS — BP 117/82 | HR 98 | Temp 98.2°F | Ht 60.0 in | Wt 240.0 lb

## 2020-02-05 DIAGNOSIS — F411 Generalized anxiety disorder: Secondary | ICD-10-CM | POA: Diagnosis not present

## 2020-02-05 DIAGNOSIS — J309 Allergic rhinitis, unspecified: Secondary | ICD-10-CM | POA: Diagnosis not present

## 2020-02-05 DIAGNOSIS — R05 Cough: Secondary | ICD-10-CM | POA: Diagnosis not present

## 2020-02-05 DIAGNOSIS — E039 Hypothyroidism, unspecified: Secondary | ICD-10-CM

## 2020-02-05 DIAGNOSIS — R059 Cough, unspecified: Secondary | ICD-10-CM

## 2020-02-05 MED ORDER — MONTELUKAST SODIUM 10 MG PO TABS: ORAL_TABLET | ORAL | 3 refills | Status: DC

## 2020-02-05 MED ORDER — FLUOXETINE HCL 10 MG PO TABS: 10.0000 mg | ORAL_TABLET | Freq: Every day | ORAL | 3 refills | Status: DC

## 2020-02-05 MED ORDER — LEVOTHYROXINE SODIUM 50 MCG PO TABS: 50.0000 ug | ORAL_TABLET | Freq: Every day | ORAL | 3 refills | Status: DC

## 2020-02-05 MED ORDER — ALBUTEROL SULFATE HFA 108 (90 BASE) MCG/ACT IN AERS: 1.0000 | INHALATION_SPRAY | RESPIRATORY_TRACT | 0 refills | Status: DC | PRN

## 2020-02-05 NOTE — Telephone Encounter (Signed)
Patient is requesting a refill of the following medications: Requested Prescriptions   Pending Prescriptions Disp Refills  . FLUoxetine (PROZAC) 10 MG tablet [Pharmacy Med Name: FLUOXETINE HCL 10 MG TABLET] 90 tablet 1    Sig: TAKE 1 TABLET BY MOUTH EVERY DAY    Date of patient request: 02/05/2020 Last office visit: 10/01/2019 telemed Date of last refill: 01/09/2020 Last refill amount: 30 tablets Follow up time period per chart: 02/05/2020

## 2020-02-05 NOTE — Patient Instructions (Addendum)
  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  Can try albuterol 1 to 2 puffs up to every 4 hours if needed if cough returns, but I would also recommend restarting the omeprazole as heartburn can also trigger cough.  If any worsening symptoms, chest pains or shortness of breath, be seen right away.  Continue fluoxetine, synthroid, singular same doses for now.    If you have lab work done today you will be contacted with your lab results within the next 2 weeks.  If you have not heard from [place] then please contact us. The fastest way to get your results is to register for My Chart.   IF you received an x-ray today, you will receive an invoice from [facility]. Please contact [facility] at [phone] with questions or concerns regarding your invoice.   IF you received labwork today, you will receive an invoice from [facility]. Please contact [facility] at [phone] with questions or concerns regarding your invoice.   Our billing staff will not be able to assist you with questions regarding bills from these companies.  You will be contacted with the lab results as soon as they are available. The fastest way to get your results is to activate your My Chart account. Instructions are located on the last page of this paperwork. If you have not heard from [place] regarding the results in 2 weeks, please contact this office.
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
## 2020-02-06 DIAGNOSIS — F411 Generalized anxiety disorder: Secondary | ICD-10-CM | POA: Diagnosis not present

## 2020-02-06 LAB — TSH: TSH: 2.33 u[IU]/mL (ref 0.450–4.500)

## 2020-02-10 ENCOUNTER — Other Ambulatory Visit: Payer: Self-pay | Admitting: Family Medicine

## 2020-02-10 DIAGNOSIS — F411 Generalized anxiety disorder: Secondary | ICD-10-CM

## 2020-02-13 DIAGNOSIS — F411 Generalized anxiety disorder: Secondary | ICD-10-CM | POA: Diagnosis not present

## 2020-02-27 DIAGNOSIS — F411 Generalized anxiety disorder: Secondary | ICD-10-CM | POA: Diagnosis not present

## 2020-03-04 ENCOUNTER — Ambulatory Visit: Payer: BC Managed Care – PPO | Admitting: Adult Health

## 2020-03-04 DIAGNOSIS — Z113 Encounter for screening for infections with a predominantly sexual mode of transmission: Secondary | ICD-10-CM | POA: Diagnosis not present

## 2020-03-05 DIAGNOSIS — F411 Generalized anxiety disorder: Secondary | ICD-10-CM | POA: Diagnosis not present

## 2020-03-16 DIAGNOSIS — F411 Generalized anxiety disorder: Secondary | ICD-10-CM | POA: Diagnosis not present

## 2020-03-20 ENCOUNTER — Other Ambulatory Visit: Payer: Self-pay | Admitting: Family Medicine

## 2020-03-20 DIAGNOSIS — F411 Generalized anxiety disorder: Secondary | ICD-10-CM

## 2020-03-26 DIAGNOSIS — F411 Generalized anxiety disorder: Secondary | ICD-10-CM | POA: Diagnosis not present

## 2020-03-29 ENCOUNTER — Other Ambulatory Visit: Payer: Self-pay | Admitting: Family Medicine

## 2020-03-29 DIAGNOSIS — E039 Hypothyroidism, unspecified: Secondary | ICD-10-CM

## 2020-04-02 DIAGNOSIS — F411 Generalized anxiety disorder: Secondary | ICD-10-CM | POA: Diagnosis not present

## 2020-04-07 ENCOUNTER — Other Ambulatory Visit: Payer: Self-pay | Admitting: Family Medicine

## 2020-04-07 ENCOUNTER — Encounter: Payer: Self-pay | Admitting: Family Medicine

## 2020-04-07 DIAGNOSIS — F411 Generalized anxiety disorder: Secondary | ICD-10-CM

## 2020-04-08 ENCOUNTER — Other Ambulatory Visit: Payer: Self-pay | Admitting: Family Medicine

## 2020-04-08 DIAGNOSIS — F411 Generalized anxiety disorder: Secondary | ICD-10-CM

## 2020-04-08 NOTE — Telephone Encounter (Signed)
I spoke with pt to let her know that she has 3 refills on the rx so I advised her to contact the pharmacy and if she does not get any results from them to send me a message back thru MyChart and I will take care of it.

## 2020-04-09 ENCOUNTER — Encounter: Payer: Self-pay | Admitting: Family Medicine

## 2020-04-09 DIAGNOSIS — F411 Generalized anxiety disorder: Secondary | ICD-10-CM | POA: Diagnosis not present

## 2020-04-14 DIAGNOSIS — F411 Generalized anxiety disorder: Secondary | ICD-10-CM | POA: Diagnosis not present

## 2020-04-28 DIAGNOSIS — F411 Generalized anxiety disorder: Secondary | ICD-10-CM | POA: Diagnosis not present

## 2020-05-04 ENCOUNTER — Other Ambulatory Visit: Payer: Self-pay | Admitting: Family Medicine

## 2020-05-04 DIAGNOSIS — J309 Allergic rhinitis, unspecified: Secondary | ICD-10-CM

## 2020-05-14 ENCOUNTER — Encounter: Payer: Self-pay | Admitting: Family Medicine

## 2020-05-14 ENCOUNTER — Other Ambulatory Visit: Payer: Self-pay

## 2020-05-14 ENCOUNTER — Ambulatory Visit: Payer: BC Managed Care – PPO | Admitting: Family Medicine

## 2020-05-14 VITALS — BP 130/86 | HR 88 | Temp 97.6°F | Ht 60.0 in | Wt 238.0 lb

## 2020-05-14 DIAGNOSIS — M542 Cervicalgia: Secondary | ICD-10-CM | POA: Diagnosis not present

## 2020-05-14 DIAGNOSIS — F411 Generalized anxiety disorder: Secondary | ICD-10-CM | POA: Diagnosis not present

## 2020-05-14 DIAGNOSIS — R002 Palpitations: Secondary | ICD-10-CM | POA: Diagnosis not present

## 2020-05-14 DIAGNOSIS — K219 Gastro-esophageal reflux disease without esophagitis: Secondary | ICD-10-CM | POA: Diagnosis not present

## 2020-05-14 NOTE — Progress Notes (Signed)
Subjective:  Patient ID: Meagan [name], female    DOB: [birth year]  Age: 44 y.o. MRN: [id number]  CC:  Chief Complaint  Patient presents with  . neck pain and heart fluters    reports feeling some neck pain w/ exercise and fluters at times  . Gastroesophageal Reflux  . Anxiety    HPI Meagan [name] presents for   Palpitations Reports symptoms of heart fluttering - few years. No recent changes. May be dehydration - better at drinking water.   Started workouts at [employer] in May/June. When heart rate increases on the treadmill - has shooting left neck pain.no chest pains. No back of neck pain, no arm pain.  Stopped workouts since July.  Notices left neck pain if HR up over 150 with walking as well.  No FH of early heart disease. Saw cardiology Dr. Tresa Endo in 2018.  Plan for ETT, but not done.  Hx of OSA, prior CPAP, off since normal study in 2019. Has gained about 50#'s since that time. Does not feel sleepy during the day. No known snoring.  No current statin. Mild LDL elevation prior.  Lab Results  Component Value Date   CHOL 208 (H) 02/24/2018   HDL 88 02/24/2018   LDLCALC 108 (H) 02/24/2018   TRIG 62 02/24/2018   CHOLHDL 2.4 02/24/2018   Anxiety: History of anxiety, well controlled in June on fluoxetine 10 mg daily.  Also history of hypothyroidism with stable TSH in June on Synthroid 50 mcg daily. More anxiety past few months. Talking with therapist - 2 times per month.  Does not want to increase fluoxetine - feels like higher dose changes her ability to feel/emotions. Buspar was not tolerated in past. Does not want  Exercise helped manage anxiety better before.   GAD 7 : Generalized Anxiety Score 05/14/2020  Nervous, Anxious, on Edge 3  Control/stop worrying 1  Worry too much - different things 3  Trouble relaxing 3  Restless 3  Easily annoyed or irritable 3  Afraid - awful might happen 1  Total GAD 7 Score 17  Anxiety Difficulty Not difficult at all     Lab Results   Component Value Date   TSH 2.330 02/05/2020   Cough: Discussed at June visit, possible reactive airway/bronchospasm versus laryngeal pharyngeal reflux. Off omeprazole at that time. Has heartburn 3 times per week. Has forgotten to take omeprazole. Cough with heartburn after eating.   History Patient Active Problem List   Diagnosis Date Noted  . Bilateral knee pain 03/17/2018  . Gastroesophageal reflux disease 03/02/2018  . Seasonal allergic rhinitis 03/02/2018  . Acquired hypothyroidism 03/02/2018  . RLD (ruptured lumbar disc) 03/02/2018  . Generalized anxiety disorder 09/03/2017  . Obesity 09/03/2017  . OSA on CPAP 02/09/2015  . Uncontrolled narcolepsy 04/03/2013  . Hypersomnia, persistent 01/11/2013   Past Medical History:  Diagnosis Date  . Acquired hypothyroidism 03/02/2018  . Allergy    Seasonal  . Anxiety   . Complication of anesthesia    post -op nausea and vomiting at age 49  . Constipation, chronic   . Depression   . Family history of anesthesia complication    cousin woke-up during procedure  . GERD (gastroesophageal reflux disease)   . Narcolepsy   . Ruptured lumbar disc    L5  . Sleep apnea    wears CPAP nightly  . Sleep paralysis    at times  . Uncontrolled narcolepsy 04/03/2013   Diagnosed 2002 , by Dr Joni Fears  Young. Meanwhile  Medications have become ineffective and patient gained 100 pounds, developed OSA, now on CPAP 9 cm, MSLT to follow in 04-19-13.   . Vivid dream    Past Surgical History:  Procedure Laterality Date  . CHOLECYSTECTOMY N/A 01/01/2014   Procedure: LAPAROSCOPIC CHOLECYSTECTOMY WITH INTRAOPERATIVE CHOLANGIOGRAM;  Surgeon: Wilmon Arms. Corliss Skains, MD;  Location: [facility] OR;  Service: General;  Laterality: N/A;  . TONSILLECTOMY AND ADENOIDECTOMY  1981   age 25  . VAGINAL DELIVERY     Allergies  Allergen Reactions  . Amoxicillin Itching and Rash   Prior to Admission medications   Medication Sig Start Date End Date Taking? Authorizing Provider    albuterol (VENTOLIN HFA) 108 (90 Base) MCG/ACT inhaler Inhale 1-2 puffs into the lungs every 4 (four) hours as needed for wheezing or shortness of breath. 02/05/20  Yes [name], MD  desogestrel-ethinyl estradiol (KARIVA) 0.15-0.02/0.01 MG (21/5) tablet Kariva (28) 0.15 mg-0.02 mg (21)/0.01 mg (5) tablet  Take 1 tablet every day by oral route.   Yes [provider]  FLUoxetine (PROZAC) 10 MG tablet Take 1 tablet (10 mg total) by mouth daily. 02/05/20  Yes [name], MD  levothyroxine (SYNTHROID) 50 MCG tablet Take 1 tablet (50 mcg total) by mouth daily. 02/05/20  Yes [name], MD  montelukast (SINGULAIR) 10 MG tablet TAKE 1 TABLET BY MOUTH EVERYDAY AT BEDTIME 05/04/20  Yes [name], MD  Multiple Vitamins-Minerals (MULTIVITAMIN ADULT PO) multivitamin   Yes [provider]  omeprazole (PRILOSEC) 20 MG capsule TAKE 1 CAPSULE (20 MG TOTAL) BY MOUTH DAILY AS NEEDED. 10/10/19  Yes [name], MD   Social History   Socioeconomic History  . Marital status: Single    Spouse name: Not on file  . Number of children: 1  . Years of education: some col.  . Highest education level: Not on file  Occupational History    Employer: [employer]    Comment: [employer]  Tobacco Use  . Smoking status: Former Smoker    Start date: 09/05/2001  . Smokeless tobacco: Never Used  . Tobacco comment: 10 years ago  Substance and Sexual Activity  . Alcohol use: Yes    Comment: occasionally,one 3-7 oz of wine per week  . Drug use: No  . Sexual activity: Not Currently    Birth control/protection: Pill    Comment: kariva  Other Topics Concern  . Not on file  Social History Narrative   Patient is single and lives at home, her daughter lives with her.   Patient has one child.   Patient is working full-time.   Patient has some college education.   Patient is left handed.   Patient does not drink any caffeine.   Social Determinants of Health    Financial Resource Strain:   . Difficulty of Paying Living Expenses: Not on file  Food Insecurity:   . Worried About Programme researcher, broadcasting/film/video in the Last Year: Not on file  . Ran Out of Food in the Last Year: Not on file  Transportation Needs:   . Lack of Transportation (Medical): Not on file  . Lack of Transportation (Non-Medical): Not on file  Physical Activity:   . Days of Exercise per Week: Not on file  . Minutes of Exercise per Session: Not on file  Stress:   . Feeling of Stress : Not on file  Social Connections:   . Frequency of Communication with Friends and Family:  Not on file  . Frequency of Social Gatherings with Friends and Family: Not on file  . Attends Religious Services: Not on file  . Active Member of Clubs or Organizations: Not on file  . Attends Banker Meetings: Not on file  . Marital Status: Not on file  Intimate Partner Violence:   . Fear of Current or Ex-Partner: Not on file  . Emotionally Abused: Not on file  . Physically Abused: Not on file  . Sexually Abused: Not on file    Review of Systems  Respiratory: Positive for cough. Negative for shortness of breath.   Cardiovascular: Positive for palpitations. Negative for chest pain.  Gastrointestinal:       Heartburn  Psychiatric/Behavioral: Negative for sleep disturbance. The patient is nervous/anxious.      Objective:   Vitals:   05/14/20 0933  BP: 130/86  Pulse: 88  Temp: 97.6 F (36.4 C)  SpO2: 98%  Weight: 238 lb (108 kg)  Height: 5' (1.524 m)     Physical Exam Vitals reviewed.  Constitutional:      Appearance: She is well-developed.  HENT:     Head: Normocephalic and atraumatic.  Eyes:     Conjunctiva/sclera: Conjunctivae normal.     Pupils: Pupils are equal, round, and reactive to light.  Neck:     Vascular: No carotid bruit.  Cardiovascular:     Rate and Rhythm: Normal rate and regular rhythm.     Heart sounds: Normal heart sounds.  Pulmonary:     Effort: Pulmonary  effort is normal.     Breath sounds: Normal breath sounds.  Abdominal:     Palpations: Abdomen is soft. There is no pulsatile mass.     Tenderness: There is no abdominal tenderness.  Musculoskeletal:     Comments: Cervical range of motion intact, does not reproduce left neck symptoms.  Pain-free range of motion of shoulders.   Skin:    General: Skin is warm and dry.  Neurological:     General: No focal deficit present.     Mental Status: She is alert and oriented to person, place, and time.  Psychiatric:        Mood and Affect: Mood normal.        Behavior: Behavior normal.        Thought Content: Thought content normal.     EKG: Sinus rhythm, rate 85, no acute ST/T wave changes apparent, no significant change compared to 05/09/2017.  40 minutes spent during visit, greater than 50% counseling and assimilation of information, chart review, and discussion of plan.   Assessment & Plan:  Meagan [name] is a 44 y.o. female . Palpitations - Plan: EKG 12-Lead, Ambulatory referral to Cardiology Neck pain - Plan: Ambulatory referral to Cardiology  -Chronic palpitations, thought to be related to fluid intake, may also be component of anxiety.  Has not had significant change in the symptoms, but does note left neck shooting pain with exertional exercise, including with heart rate above 150.  Did not have stress testing few years ago.  No chest pains.  Less likely cardiac cause but will have evaluated initially with cardiology to determine if stress testing indicated.  Low intensity exercise discussed for now as it has been beneficial for anxiety.  Not fasting this morning.  Consider fasting lipid panel at future visit.  Also consider repeat sleep study with weight gain, but denies symptoms.  Generalized anxiety disorder  -Increased with social stressors.  Declined medication changes.  Exercise has been helpful in managing before, but has held off given above symptoms.  Low intensity okay with  walking, handout given on managing anxiety.  Continue follow-up with therapist.  Gastroesophageal reflux disease without esophagitis  -PPI or H2 blocker discussed.  Recheck 1 month  No orders of the defined types were placed in this encounter.  Patient Instructions   pepcid or omeprazole for heartburn, follow up if cough not improving.  Continue meeting with therapist. I will refer you to cardiology to evaluate the neck symptoms and clear you to return to exercise. Low intensity exercise is fine for now.   Return to the clinic or go to the nearest emergency room if any of your symptoms worsen or new symptoms occur.   Palpitations Palpitations are feelings that your heartbeat is irregular or is faster than normal. It may feel like your heart is fluttering or skipping a beat. Palpitations are usually not a serious problem. They may be caused by many things, including smoking, caffeine, alcohol, stress, and certain medicines or drugs. Most causes of palpitations are not serious. However, some palpitations can be a sign of a serious problem. You may need further tests to rule out serious medical problems. Follow these instructions at home:     Pay attention to any changes in your condition. Take these actions to help manage your symptoms: Eating and drinking  Avoid foods and drinks that may cause palpitations. These may include: ? Caffeinated coffee, tea, soft drinks, diet pills, and energy drinks. ? Chocolate. ? Alcohol. Lifestyle  Take steps to reduce your stress and anxiety. Things that can help you relax include: ? Yoga. ? Mind-body activities, such as deep breathing, meditation, or using words and images to create positive thoughts (guided imagery). ? Physical activity, such as swimming, jogging, or walking. Tell your health care provider if your palpitations increase with activity. If you have chest pain or shortness of breath with activity, do not continue the activity until you  are seen by your health care provider. ? Biofeedback. This is a method that helps you learn to use your mind to control things in your body, such as your heartbeat.  Do not use drugs, including cocaine or ecstasy. Do not use marijuana.  Get plenty of rest and sleep. Keep a regular bed time. General instructions  Take over-the-counter and prescription medicines only as told by your health care provider.  Do not use any products that contain nicotine or tobacco, such as cigarettes and e-cigarettes. If you need help quitting, ask your health care provider.  Keep all follow-up visits as told by your health care provider. This is important. These may include visits for further testing if palpitations do not go away or get worse. Contact a health care provider if you:  Continue to have a fast or irregular heartbeat after 24 hours.  Notice that your palpitations occur more often. Get help right away if you:  Have chest pain or shortness of breath.  Have a severe headache.  Feel dizzy or you faint. Summary  Palpitations are feelings that your heartbeat is irregular or is faster than normal. It may feel like your heart is fluttering or skipping a beat.  Palpitations may be caused by many things, including smoking, caffeine, alcohol, stress, certain medicines, and drugs.  Although most causes of palpitations are not serious, some causes can be a sign of a serious medical problem.  Get help right away if you faint or have chest pain, shortness of  breath, a severe headache, or dizziness. This information is not intended to replace advice given to you by your health care provider. Make sure you discuss any questions you have with your health care provider. Document Revised: 10/04/2017 Document Reviewed: 10/04/2017 Elsevier Patient Education  2020 Elsevier Inc.    Managing Anxiety, Adult After being diagnosed with an anxiety disorder, you may be relieved to know why you have felt or  behaved a certain way. You may also feel overwhelmed about the treatment ahead and what it will mean for your life. With care and support, you can manage this condition and recover from it. How to manage lifestyle changes Managing stress and anxiety  Stress is your body's reaction to life changes and events, both good and bad. Most stress will last just a few hours, but stress can be ongoing and can lead to more than just stress. Although stress can play a major role in anxiety, it is not the same as anxiety. Stress is usually caused by something external, such as a deadline, test, or competition. Stress normally passes after the triggering event has ended.  Anxiety is caused by something internal, such as imagining a terrible outcome or worrying that something will go wrong that will devastate you. Anxiety often does not go away even after the triggering event is over, and it can become long-term (chronic) worry. It is important to understand the differences between stress and anxiety and to manage your stress effectively so that it does not lead to an anxious response. Talk with your health care provider or a counselor to learn more about reducing anxiety and stress. He or she may suggest tension reduction techniques, such as:  Music therapy. This can include creating or listening to music that you enjoy and that inspires you.  Mindfulness-based meditation. This involves being aware of your normal breaths while not trying to control your breathing. It can be done while sitting or walking.  Centering prayer. This involves focusing on a word, phrase, or sacred image that means something to you and brings you peace.  Deep breathing. To do this, expand your stomach and inhale slowly through your nose. Hold your breath for 3-5 seconds. Then exhale slowly, letting your stomach muscles relax.  Self-talk. This involves identifying thought patterns that lead to anxiety reactions and changing those  patterns.  Muscle relaxation. This involves tensing muscles and then relaxing them. Choose a tension reduction technique that suits your lifestyle and personality. These techniques take time and practice. Set aside 5-15 minutes a day to do them. Therapists can offer counseling and training in these techniques. The training to help with anxiety may be covered by some insurance plans. Other things you can do to manage stress and anxiety include:  Keeping a stress/anxiety diary. This can help you learn what triggers your reaction and then learn ways to manage your response.  Thinking about how you react to certain situations. You may not be able to control everything, but you can control your response.  Making time for activities that help you relax and not feeling guilty about spending your time in this way.  Visual imagery and yoga can help you stay calm and relax.  Medicines Medicines can help ease symptoms. Medicines for anxiety include:  Anti-anxiety drugs.  Antidepressants. Medicines are often used as a primary treatment for anxiety disorder. Medicines will be prescribed by a health care provider. When used together, medicines, psychotherapy, and tension reduction techniques may be the most effective treatment. Relationships  Relationships can play a big part in helping you recover. Try to spend more time connecting with trusted friends and family members. Consider going to couples counseling, taking family education classes, or going to family therapy. Therapy can help you and others better understand your condition. How to recognize changes in your anxiety Everyone responds differently to treatment for anxiety. Recovery from anxiety happens when symptoms decrease and stop interfering with your daily activities at home or work. This may mean that you will start to:  Have better concentration and focus. Worry will interfere less in your daily thinking.  Sleep better.  Be less  irritable.  Have more energy.  Have improved memory. It is important to recognize when your condition is getting worse. Contact your health care provider if your symptoms interfere with home or work and you feel like your condition is not improving. Follow these instructions at home: Activity  Exercise. Most adults should do the following: ? Exercise for at least 150 minutes each week. The exercise should increase your heart rate and make you sweat (moderate-intensity exercise). ? Strengthening exercises at least twice a week.  Get the right amount and quality of sleep. Most adults need 7-9 hours of sleep each night. Lifestyle   Eat a healthy diet that includes plenty of vegetables, fruits, whole grains, low-fat dairy products, and lean protein. Do not eat a lot of foods that are high in solid fats, added sugars, or salt.  Make choices that simplify your life.  Do not use any products that contain nicotine or tobacco, such as cigarettes, e-cigarettes, and chewing tobacco. If you need help quitting, ask your health care provider.  Avoid caffeine, alcohol, and certain over-the-counter cold medicines. These may make you feel worse. Ask your pharmacist which medicines to avoid. General instructions  Take over-the-counter and prescription medicines only as told by your health care provider.  Keep all follow-up visits as told by your health care provider. This is important. Where to find support You can get help and support from these sources:  Self-help groups.  Online and [employer]community organizations.  A trusted spiritual leader.  Couples counseling.  Family education classes.  Family therapy. Where to find more information You may find that joining a support group helps you deal with your anxiety. The following sources can help you locate counselors or support groups near you:  Mental Health America: www.mentalhealthamerica.net  Anxiety and Depression Association of [place]  (ADAA): ProgramCam.dewww.adaa.org  The [employer] on Mental Illness (NAMI): www.nami.org Contact a health care provider if you:  Have a hard time staying focused or finishing daily tasks.  Spend many hours a day feeling worried about everyday life.  Become exhausted by worry.  Start to have headaches, feel tense, or have nausea.  Urinate more than normal.  Have diarrhea. Get help right away if you have:  A racing heart and shortness of breath.  Thoughts of hurting yourself or others. If you ever feel like you may hurt yourself or others, or have thoughts about taking your own life, get help right away. You can go to your nearest emergency department or call:  Your local emergency services (911 in the [place]  A suicide crisis helpline, such as the National Suicide Prevention Lifeline at 316-139-92691-872-429-1489. This is open 24 hours a day. Summary  Taking steps to learn and use tension reduction techniques can help calm you and help prevent triggering an anxiety reaction.  When used together, medicines, psychotherapy, and tension reduction techniques may be the  most effective treatment.  Family, friends, and partners can play a big part in helping you recover from an anxiety disorder. This information is not intended to replace advice given to you by your health care provider. Make sure you discuss any questions you have with your health care provider. Document Revised: 01/22/2019 Document Reviewed: 01/22/2019 Elsevier Patient Education  [employer].    If you have lab work done today you will be contacted with your lab results within the next 2 weeks.  If you have not heard from [place] then please contact us. The fastest way to get your results is to register for My Chart.   IF you received an x-ray today, you will receive an invoice from [facility]. Please contact [facility] at [phone] with questions or concerns regarding your invoice.   IF you received labwork  today, you will receive an invoice from [facility]. Please contact [facility] at [phone] with questions or concerns regarding your invoice.   Our billing staff will not be able to assist you with questions regarding bills from these companies.  You will be contacted with the lab results as soon as they are available. The fastest way to get your results is to activate your My Chart account. Instructions are located on the last page of this paperwork. If you have not heard from [place] regarding the results in 2 weeks, please contact this office.          Signed, Meredith Staggers, MD [facility]

## 2020-05-14 NOTE — Patient Instructions (Addendum)
pepcid or omeprazole for heartburn, follow up if cough not improving.  Continue meeting with therapist. I will refer you to cardiology to evaluate the neck symptoms and clear you to return to exercise. Low intensity exercise is fine for now.   Return to the clinic or go to the nearest emergency room if any of your symptoms worsen or new symptoms occur.   Palpitations Palpitations are feelings that your heartbeat is irregular or is faster than normal. It may feel like your heart is fluttering or skipping a beat. Palpitations are usually not a serious problem. They may be caused by many things, including smoking, caffeine, alcohol, stress, and certain medicines or drugs. Most causes of palpitations are not serious. However, some palpitations can be a sign of a serious problem. You may need further tests to rule out serious medical problems. Follow these instructions at home:     Pay attention to any changes in your condition. Take these actions to help manage your symptoms: Eating and drinking  Avoid foods and drinks that may cause palpitations. These may include: ? Caffeinated coffee, tea, soft drinks, diet pills, and energy drinks. ? Chocolate. ? Alcohol. Lifestyle  Take steps to reduce your stress and anxiety. Things that can help you relax include: ? Yoga. ? Mind-body activities, such as deep breathing, meditation, or using words and images to create positive thoughts (guided imagery). ? Physical activity, such as swimming, jogging, or walking. Tell your health care provider if your palpitations increase with activity. If you have chest pain or shortness of breath with activity, do not continue the activity until you are seen by your health care provider. ? Biofeedback. This is a method that helps you learn to use your mind to control things in your body, such as your heartbeat.  Do not use drugs, including cocaine or ecstasy. Do not use marijuana.  Get plenty of rest and sleep. Keep a  regular bed time. General instructions  Take over-the-counter and prescription medicines only as told by your health care provider.  Do not use any products that contain nicotine or tobacco, such as cigarettes and e-cigarettes. If you need help quitting, ask your health care provider.  Keep all follow-up visits as told by your health care provider. This is important. These may include visits for further testing if palpitations do not go away or get worse. Contact a health care provider if you:  Continue to have a fast or irregular heartbeat after 24 hours.  Notice that your palpitations occur more often. Get help right away if you:  Have chest pain or shortness of breath.  Have a severe headache.  Feel dizzy or you faint. Summary  Palpitations are feelings that your heartbeat is irregular or is faster than normal. It may feel like your heart is fluttering or skipping a beat.  Palpitations may be caused by many things, including smoking, caffeine, alcohol, stress, certain medicines, and drugs.  Although most causes of palpitations are not serious, some causes can be a sign of a serious medical problem.  Get help right away if you faint or have chest pain, shortness of breath, a severe headache, or dizziness. This information is not intended to replace advice given to you by your health care provider. Make sure you discuss any questions you have with your health care provider. Document Revised: 10/04/2017 Document Reviewed: 10/04/2017 Elsevier Patient Education  2020 Elsevier Inc.    Managing Anxiety, Adult After being diagnosed with an anxiety disorder, you may be  relieved to know why you have felt or behaved a certain way. You may also feel overwhelmed about the treatment ahead and what it will mean for your life. With care and support, you can manage this condition and recover from it. How to manage lifestyle changes Managing stress and anxiety  Stress is your body's  reaction to life changes and events, both good and bad. Most stress will last just a few hours, but stress can be ongoing and can lead to more than just stress. Although stress can play a major role in anxiety, it is not the same as anxiety. Stress is usually caused by something external, such as a deadline, test, or competition. Stress normally passes after the triggering event has ended.  Anxiety is caused by something internal, such as imagining a terrible outcome or worrying that something will go wrong that will devastate you. Anxiety often does not go away even after the triggering event is over, and it can become long-term (chronic) worry. It is important to understand the differences between stress and anxiety and to manage your stress effectively so that it does not lead to an anxious response. Talk with your health care provider or a counselor to learn more about reducing anxiety and stress. He or she may suggest tension reduction techniques, such as:  Music therapy. This can include creating or listening to music that you enjoy and that inspires you.  Mindfulness-based meditation. This involves being aware of your normal breaths while not trying to control your breathing. It can be done while sitting or walking.  Centering prayer. This involves focusing on a word, phrase, or sacred image that means something to you and brings you peace.  Deep breathing. To do this, expand your stomach and inhale slowly through your nose. Hold your breath for 3-5 seconds. Then exhale slowly, letting your stomach muscles relax.  Self-talk. This involves identifying thought patterns that lead to anxiety reactions and changing those patterns.  Muscle relaxation. This involves tensing muscles and then relaxing them. Choose a tension reduction technique that suits your lifestyle and personality. These techniques take time and practice. Set aside 5-15 minutes a day to do them. Therapists can offer counseling and  training in these techniques. The training to help with anxiety may be covered by some insurance plans. Other things you can do to manage stress and anxiety include:  Keeping a stress/anxiety diary. This can help you learn what triggers your reaction and then learn ways to manage your response.  Thinking about how you react to certain situations. You may not be able to control everything, but you can control your response.  Making time for activities that help you relax and not feeling guilty about spending your time in this way.  Visual imagery and yoga can help you stay calm and relax.  Medicines Medicines can help ease symptoms. Medicines for anxiety include:  Anti-anxiety drugs.  Antidepressants. Medicines are often used as a primary treatment for anxiety disorder. Medicines will be prescribed by a health care provider. When used together, medicines, psychotherapy, and tension reduction techniques may be the most effective treatment. Relationships Relationships can play a big part in helping you recover. Try to spend more time connecting with trusted friends and family members. Consider going to couples counseling, taking family education classes, or going to family therapy. Therapy can help you and others better understand your condition. How to recognize changes in your anxiety Everyone responds differently to treatment for anxiety. Recovery from anxiety happens  when symptoms decrease and stop interfering with your daily activities at home or work. This may mean that you will start to:  Have better concentration and focus. Worry will interfere less in your daily thinking.  Sleep better.  Be less irritable.  Have more energy.  Have improved memory. It is important to recognize when your condition is getting worse. Contact your health care provider if your symptoms interfere with home or work and you feel like your condition is not improving. Follow these instructions at  home: Activity  Exercise. Most adults should do the following: ? Exercise for at least 150 minutes each week. The exercise should increase your heart rate and make you sweat (moderate-intensity exercise). ? Strengthening exercises at least twice a week.  Get the right amount and quality of sleep. Most adults need 7-9 hours of sleep each night. Lifestyle   Eat a healthy diet that includes plenty of vegetables, fruits, whole grains, low-fat dairy products, and lean protein. Do not eat a lot of foods that are high in solid fats, added sugars, or salt.  Make choices that simplify your life.  Do not use any products that contain nicotine or tobacco, such as cigarettes, e-cigarettes, and chewing tobacco. If you need help quitting, ask your health care provider.  Avoid caffeine, alcohol, and certain over-the-counter cold medicines. These may make you feel worse. Ask your pharmacist which medicines to avoid. General instructions  Take over-the-counter and prescription medicines only as told by your health care provider.  Keep all follow-up visits as told by your health care provider. This is important. Where to find support You can get help and support from these sources:  Self-help groups.  Online and [employer] organizations.  A trusted spiritual leader.  Couples counseling.  Family education classes.  Family therapy. Where to find more information You may find that joining a support group helps you deal with your anxiety. The following sources can help you locate counselors or support groups near you:  Mental Health America: www.mentalhealthamerica.net  Anxiety and Depression Association of [place] (ADAA): ProgramCam.dewww.adaa.org  The [employer] on Mental Illness (NAMI): www.nami.org Contact a health care provider if you:  Have a hard time staying focused or finishing daily tasks.  Spend many hours a day feeling worried about everyday life.  Become exhausted by worry.  Start to  have headaches, feel tense, or have nausea.  Urinate more than normal.  Have diarrhea. Get help right away if you have:  A racing heart and shortness of breath.  Thoughts of hurting yourself or others. If you ever feel like you may hurt yourself or others, or have thoughts about taking your own life, get help right away. You can go to your nearest emergency department or call:  Your local emergency services (911 in the [place]  A suicide crisis helpline, such as the National Suicide Prevention Lifeline at (820)574-10401-613 491 1783. This is open 24 hours a day. Summary  Taking steps to learn and use tension reduction techniques can help calm you and help prevent triggering an anxiety reaction.  When used together, medicines, psychotherapy, and tension reduction techniques may be the most effective treatment.  Family, friends, and partners can play a big part in helping you recover from an anxiety disorder. This information is not intended to replace advice given to you by your health care provider. Make sure you discuss any questions you have with your health care provider. Document Revised: 01/22/2019 Document Reviewed: 01/22/2019 Elsevier Patient Education  2020 [employer]  If you have lab work done today you will be contacted with your lab results within the next 2 weeks.  If you have not heard from [place] then please contact us. The fastest way to get your results is to register for My Chart.   IF you received an x-ray today, you will receive an invoice from [facility]. Please contact [facility] at [phone] with questions or concerns regarding your invoice.   IF you received labwork today, you will receive an invoice from [facility]. Please contact [facility] at [phone] with questions or concerns regarding your invoice.   Our billing staff will not be able to assist you with questions regarding bills from these companies.  You will be contacted with the lab  results as soon as they are available. The fastest way to get your results is to activate your My Chart account. Instructions are located on the last page of this paperwork. If you have not heard from [place] regarding the results in 2 weeks, please contact this office.

## 2020-05-25 ENCOUNTER — Encounter: Payer: Self-pay | Admitting: General Practice

## 2020-05-26 DIAGNOSIS — F411 Generalized anxiety disorder: Secondary | ICD-10-CM | POA: Diagnosis not present

## 2020-05-26 DIAGNOSIS — Z20828 Contact with and (suspected) exposure to other viral communicable diseases: Secondary | ICD-10-CM | POA: Diagnosis not present

## 2020-06-09 DIAGNOSIS — F411 Generalized anxiety disorder: Secondary | ICD-10-CM | POA: Diagnosis not present

## 2020-06-23 DIAGNOSIS — F411 Generalized anxiety disorder: Secondary | ICD-10-CM | POA: Diagnosis not present

## 2020-07-07 DIAGNOSIS — F411 Generalized anxiety disorder: Secondary | ICD-10-CM | POA: Diagnosis not present

## 2020-07-07 DIAGNOSIS — F4321 Adjustment disorder with depressed mood: Secondary | ICD-10-CM | POA: Diagnosis not present

## 2020-07-21 DIAGNOSIS — F411 Generalized anxiety disorder: Secondary | ICD-10-CM | POA: Diagnosis not present

## 2020-08-04 DIAGNOSIS — F4321 Adjustment disorder with depressed mood: Secondary | ICD-10-CM | POA: Diagnosis not present

## 2020-08-18 DIAGNOSIS — F411 Generalized anxiety disorder: Secondary | ICD-10-CM | POA: Diagnosis not present

## 2020-08-20 DIAGNOSIS — F4321 Adjustment disorder with depressed mood: Secondary | ICD-10-CM | POA: Diagnosis not present

## 2020-09-21 ENCOUNTER — Encounter: Payer: Self-pay | Admitting: Family Medicine

## 2020-09-22 DIAGNOSIS — Z20822 Contact with and (suspected) exposure to covid-19: Secondary | ICD-10-CM | POA: Diagnosis not present

## 2020-09-24 ENCOUNTER — Telehealth: Payer: BC Managed Care – PPO | Admitting: Family Medicine

## 2020-09-24 ENCOUNTER — Encounter: Payer: Self-pay | Admitting: Family Medicine

## 2020-09-24 ENCOUNTER — Other Ambulatory Visit: Payer: Self-pay

## 2020-09-24 VITALS — Ht 60.0 in | Wt 240.0 lb

## 2020-09-24 DIAGNOSIS — F411 Generalized anxiety disorder: Secondary | ICD-10-CM | POA: Diagnosis not present

## 2020-09-24 DIAGNOSIS — R059 Cough, unspecified: Secondary | ICD-10-CM | POA: Diagnosis not present

## 2020-09-24 DIAGNOSIS — U071 COVID-19: Secondary | ICD-10-CM

## 2020-09-24 MED ORDER — BENZONATATE 100 MG PO CAPS: 100.0000 mg | ORAL_CAPSULE | Freq: Three times a day (TID) | ORAL | 0 refills | Status: DC | PRN

## 2020-09-24 NOTE — Progress Notes (Signed)
Virtual Visit via Telephone Note  I connected with Meagan [name] on 09/24/20 at 3:42 PM by telephone and verified that I am speaking with the correct person using two identifiers. Patient location:home  My location: office   I discussed the limitations, risks, security and privacy concerns of performing an evaluation and management service by telephone and the availability of in person appointments. I also discussed with the patient that there may be a patient responsible charge related to this service. The patient expressed understanding and agreed to proceed, consent obtained  Chief complaint: Chief Complaint  Patient presents with  . Covid Positive    covid positve 09/22/2020. Pt is having sore throat,stuffy nose, cough, headache. Pt reports she is feeling a little better , but  Pt's mother recently passed from covid and pt is worried. Pt wants providers thoughts. Pt is taking Museux extra strength and tylenol and zinc. Pt has had 2 of 3 covid vaccines     History of Present Illness:  Covid 19 infection Initial symptoms started 09/20/20 with swollen glands, then fever, sore throat, nasal congestion, cough, headache on 09/21/20.  COVID positive test 09/22/2020 - PCR.   Symptoms are improving but she is concerned as above as her mother recently passed from COVID infection 10 days ago.  She had been around her.  Talked to her therapist today - has the resources she needs at this time with loss of her mom.  Feels a lot better today. More congested yesterday. Home pulse ox above 97%. slight dyspnea, but may be from congestion. Drinking fluids. No syncope/near syncope, no confusion.  Current treatments of Mucinex extra strength and Tylenol, zinc. Feels like she is doing better and declines referral for treatment.  Works in child care.  [employer] COVID-vaccine December 14, 2019. Has not received booster.  Congestion keeps up at night some.    Patient Active Problem List   Diagnosis  Date Noted  . Bilateral knee pain 03/17/2018  . Gastroesophageal reflux disease 03/02/2018  . Seasonal allergic rhinitis 03/02/2018  . Acquired hypothyroidism 03/02/2018  . RLD (ruptured lumbar disc) 03/02/2018  . Generalized anxiety disorder 09/03/2017  . Obesity 09/03/2017  . OSA on CPAP 02/09/2015  . Uncontrolled narcolepsy 04/03/2013  . Hypersomnia, persistent 01/11/2013   Past Medical History:  Diagnosis Date  . Acquired hypothyroidism 03/02/2018  . Allergy    Seasonal  . Anxiety   . Complication of anesthesia    post -op nausea and vomiting at age 74  . Constipation, chronic   . Depression   . Family history of anesthesia complication    cousin woke-up during procedure  . GERD (gastroesophageal reflux disease)   . Narcolepsy   . Ruptured lumbar disc    L5  . Sleep apnea    wears CPAP nightly  . Sleep paralysis    at times  . 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 Meanwhile  Medications have become ineffective and patient gained 100 pounds, developed OSA, now on CPAP 9 cm, MSLT to follow in 04-19-13.   . Vivid dream    Past Surgical History:  Procedure Laterality Date  . CHOLECYSTECTOMY N/A 01/01/2014   Procedure: LAPAROSCOPIC CHOLECYSTECTOMY WITH INTRAOPERATIVE CHOLANGIOGRAM;  Surgeon: Wilmon Arms. Corliss Skains, MD;  Location: [facility] OR;  Service: General;  Laterality: N/A;  . TONSILLECTOMY AND ADENOIDECTOMY  1981   age 45  . VAGINAL DELIVERY     Allergies  Allergen Reactions  . Amoxicillin Itching  and Rash   Prior to Admission medications   Medication Sig Start Date End Date Taking? Authorizing Provider  albuterol (VENTOLIN HFA) 108 (90 Base) MCG/ACT inhaler Inhale 1-2 puffs into the lungs every 4 (four) hours as needed for wheezing or shortness of breath. 02/05/20  Yes [name], MD  desogestrel-ethinyl estradiol (MIRCETTE) 0.15-0.02/0.01 MG (21/5) tablet Kariva (28) 0.15 mg-0.02 mg (21)/0.01 mg (5) tablet  Take 1 tablet every day by  oral route.   Yes [provider]  FLUoxetine (PROZAC) 10 MG tablet Take 1 tablet (10 mg total) by mouth daily. 02/05/20  Yes [name], MD  levothyroxine (SYNTHROID) 50 MCG tablet Take 1 tablet (50 mcg total) by mouth daily. 02/05/20  Yes [name], MD  montelukast (SINGULAIR) 10 MG tablet TAKE 1 TABLET BY MOUTH EVERYDAY AT BEDTIME 05/04/20  Yes [name], MD  Multiple Vitamins-Minerals (MULTIVITAMIN ADULT PO) multivitamin   Yes [provider]  omeprazole (PRILOSEC) 20 MG capsule TAKE 1 CAPSULE (20 MG TOTAL) BY MOUTH DAILY AS NEEDED. 10/10/19  Yes [name], MD   Social History   Socioeconomic History  . Marital status: Married    Spouse name: Not on file  . Number of children: 1  . Years of education: some col.  . Highest education level: Not on file  Occupational History    Employer: [employer]    Comment: [employer]  Tobacco Use  . Smoking status: Former Smoker    Start date: 09/05/2001  . Smokeless tobacco: Never Used  . Tobacco comment: 10 years ago  Substance and Sexual Activity  . Alcohol use: Yes    Comment: occasionally,one 3-7 oz of wine per week  . Drug use: No  . Sexual activity: Not Currently    Birth control/protection: Pill    Comment: kariva  Other Topics Concern  . Not on file  Social History Narrative   Patient is single and lives at home, her daughter lives with her.   Patient has one child.   Patient is working full-time.   Patient has some college education.   Patient is left handed.   Patient does not drink any caffeine.   Social Determinants of Health   Financial Resource Strain: Not on file  Food Insecurity: Not on file  Transportation Needs: Not on file  Physical Activity: Not on file  Stress: Not on file  Social Connections: Not on file  Intimate Partner Violence: Not on file     Observations/Objective: Vitals:   09/24/20 1448  Weight: 240 lb (108.9 kg)  Height: 5' (1.524 m)   Speaking full sentences, no respiratory distress on phone, no cough on phone, appropriate responses.  Coherent responses.  All questions were answered with understanding of plan expressed.   Assessment and Plan: COVID-19 virus infection - Plan: benzonatate (TESSALON) 100 MG capsule  Cough - Plan: benzonatate (TESSALON) 100 MG capsule COVID-19 infection, day 0 on January 16.  Symptoms are improving.  No red flag symptoms at this time.  Referral to COVID treatment discussed with BMI as risk factor.  Declined at this time as she is improving.  Timing of those treatments was discussed.  Continue symptomatic care with Mucinex, saline nasal spray, fluids, and Tessalon Perles prescribed for cough if needed.  RTC/ER precautions.  Additionally discussed her mother's passing, condolences given.  Denied any needs at this time.  Has therapist.   Follow Up Instructions:   As needed with ER/urgent care precautions  I discussed the assessment and treatment plan with the patient. The patient was provided an opportunity to ask questions and all were answered. The patient agreed with the plan and demonstrated an understanding of the instructions.   The patient was advised to call back or seek an in-person evaluation if the symptoms worsen or if the condition fails to improve as anticipated.  I provided 20 minutes of non-face-to-face time during this encounter.  Signed,   Meredith Staggers, MD Primary Care at [facility].  09/24/20

## 2020-09-25 ENCOUNTER — Encounter: Payer: Self-pay | Admitting: Family Medicine

## 2020-09-29 DIAGNOSIS — F411 Generalized anxiety disorder: Secondary | ICD-10-CM | POA: Diagnosis not present

## 2020-10-06 NOTE — Progress Notes (Unsigned)
Subjective:    CC: Sciatica  I, Christoper Fabian, LAT, ATC, am serving as scribe for Dr. Clementeen Graham.  HPI: Pt is a 45 y/o female presenting w/ sciatica in her R/L leg for years, 5+. Pt has previously seen by Dr. Hyman Bower. Pt locates pain to bilat LBP that radiates down bilat legs. Pt notes prior dx of bulging disc at L5.   Low back pain: yes- bilat Radiating pain: yes into her leg  LE numbness/tingling: yes LE weakness: no Aggravating factors: walking, anything Treatments tried: Mobic- no longer taking  Pertinent review of Systems: No fevers or chills  Relevant historical information: Sleep apnea, obesity, history chronic back pain  Of note Ayde's mother recently passed away from Covid  Objective:    Vitals:   10/07/20 0930  BP: (!) 130/92  Pulse: 97  SpO2: 97%   General: Well Developed, well nourished, and in no acute distress.   MSK: L-spine normal-appearing Nontender midline. Tender palpation bilateral lumbar paraspinal musculature. Normal lumbar motion. Lower extremity strength reflexes and sensation are equal and normal throughout distally. Negative slump test bilaterally. Normal gait.  Lab and Radiology Results  X-ray images L-spine obtained today personally independently interpreted DDD and facet DJD at L5-S1. Await formal radiology review   Impression and Recommendations:    Assessment and Plan: 45 y.o. female with low back pain with pain referred to the posterior thighs bilaterally.  Patient has some paresthesias into the right posterior thigh to lateral thigh as well.  She does not have any symptoms radiating below the level of the knee.  It is not clear at this time how much of her symptoms are true lumbosacral radicular in nature or just referred pain from her low back.  Additionally meralgia paresthetica is on the differential as well.  For now proceed with trial of physical therapy.  Additionally will have backup plan for prednisone.  Additionally  recommend trial of gabapentin at bedtime.  Recommend also heating pad and TENS unit.  If worsening will take the prednisone as prescribed and consider MRI to plan for epidural steroid injection.Marland Kitchen  PDMP not reviewed this encounter. Orders Placed This Encounter  Procedures  . DG Lumbar Spine 2-3 Views    Standing Status:   Future    Number of Occurrences:   1    Standing Expiration Date:   10/07/2021    Order Specific Question:   Reason for Exam (SYMPTOM  OR DIAGNOSIS REQUIRED)    Answer:   eval lumbar pain with posisble sciatica    Order Specific Question:   Is patient pregnant?    Answer:   No    Order Specific Question:   Preferred imaging location?    Answer:   [facility]  . Ambulatory referral to Physical Therapy    Referral Priority:   Routine    Referral Type:   Physical Medicine    Referral Reason:   Specialty Services Required    Requested Specialty:   Physical Therapy   Meds ordered this encounter  Medications  . gabapentin (NEURONTIN) 300 MG capsule    Sig: Take 1 capsule (300 mg total) by mouth 3 (three) times daily as needed.    Dispense:  90 capsule    Refill:  3  . predniSONE (DELTASONE) 50 MG tablet    Sig: Take 1 tablet (50 mg total) by mouth daily. Take if not better    Dispense:  5 tablet    Refill:  0  Discussed warning signs or symptoms. Please see discharge instructions. Patient expresses understanding.   The above documentation has been reviewed and is accurate and complete Lynne Leader, M.D.

## 2020-10-07 ENCOUNTER — Ambulatory Visit (INDEPENDENT_AMBULATORY_CARE_PROVIDER_SITE_OTHER): Payer: BC Managed Care – PPO

## 2020-10-07 ENCOUNTER — Ambulatory Visit: Payer: BC Managed Care – PPO | Admitting: Family Medicine

## 2020-10-07 ENCOUNTER — Other Ambulatory Visit: Payer: Self-pay

## 2020-10-07 VITALS — BP 130/92 | HR 97 | Ht 60.0 in | Wt 244.4 lb

## 2020-10-07 DIAGNOSIS — M545 Low back pain, unspecified: Secondary | ICD-10-CM

## 2020-10-07 MED ORDER — GABAPENTIN 300 MG PO CAPS: 300.0000 mg | ORAL_CAPSULE | Freq: Three times a day (TID) | ORAL | 3 refills | Status: DC | PRN

## 2020-10-07 MED ORDER — PREDNISONE 50 MG PO TABS: 50.0000 mg | ORAL_TABLET | Freq: Every day | ORAL | 0 refills | Status: DC

## 2020-10-07 NOTE — Patient Instructions (Addendum)
Thank you for coming in today.  Please get an Xray today before you leave  I've referred you to Physical Therapy.  Let us know if you don't hear from them in one week.  Take the gabapentin mostly at bedtime.   If not better take the prednisone.   Recheck in 6 weeks.   Let me know if this is not working.   Heating pad should help.   TENS UNIT: This is helpful for muscle pain and spasm.   Search and Purchase a TENS 7000 2nd edition at  www.tenspros.com or www.Amazon.com It should be less than $30.     TENS unit instructions: Do not shower or bathe with the unit on . Turn the unit off before removing electrodes or batteries . If the electrodes lose stickiness add a drop of water to the electrodes after they are disconnected from the unit and place on plastic sheet. If you continued to have difficulty, call the TENS unit company to purchase more electrodes. . Do not apply lotion on the skin area prior to use. Make sure the skin is clean and dry as this will help prolong the life of the electrodes. . After use, always check skin for unusual red areas, rash or other skin difficulties. If there are any skin problems, does not apply electrodes to the same area. . Never remove the electrodes from the unit by pulling the wires. . Do not use the TENS unit or electrodes other than as directed. . Do not change electrode placement without consultating your therapist or physician. Marland Kitchen Keep 2 fingers with between each electrode. . Wear time ratio is 2:1, on to off times.    For example on for 30 minutes off for 15 minutes and then on for 30 minutes off for 15 minutes

## 2020-10-08 NOTE — Progress Notes (Signed)
X-ray lumbar spine shows minimal arthritis.

## 2020-10-14 ENCOUNTER — Encounter: Payer: Self-pay | Admitting: Family Medicine

## 2020-10-14 DIAGNOSIS — M545 Low back pain, unspecified: Secondary | ICD-10-CM

## 2020-10-16 ENCOUNTER — Encounter: Payer: Self-pay | Admitting: Family Medicine

## 2020-10-21 DIAGNOSIS — F411 Generalized anxiety disorder: Secondary | ICD-10-CM | POA: Diagnosis not present

## 2020-10-23 MED ORDER — PREGABALIN 75 MG PO CAPS: 75.0000 mg | ORAL_CAPSULE | Freq: Two times a day (BID) | ORAL | 3 refills | Status: DC | PRN

## 2020-10-23 NOTE — Addendum Note (Signed)
Addended by: Rodolph Bong on: 10/23/2020 12:07 PM   Modules accepted: Orders

## 2020-10-27 DIAGNOSIS — F411 Generalized anxiety disorder: Secondary | ICD-10-CM | POA: Diagnosis not present

## 2020-11-03 ENCOUNTER — Ambulatory Visit: Payer: BC Managed Care – PPO | Admitting: Family Medicine

## 2020-11-05 ENCOUNTER — Other Ambulatory Visit: Payer: Self-pay | Admitting: Physical Therapy

## 2020-11-05 DIAGNOSIS — M545 Low back pain, unspecified: Secondary | ICD-10-CM

## 2020-11-05 NOTE — Progress Notes (Signed)
PT location was to be changed to [facility] but that order was never placed.  New PT referral placed for low back pain to [facility].

## 2020-11-10 DIAGNOSIS — F411 Generalized anxiety disorder: Secondary | ICD-10-CM | POA: Diagnosis not present

## 2020-11-16 ENCOUNTER — Ambulatory Visit: Payer: BC Managed Care – PPO | Admitting: Family Medicine

## 2020-11-24 DIAGNOSIS — F411 Generalized anxiety disorder: Secondary | ICD-10-CM | POA: Diagnosis not present

## 2020-11-26 DIAGNOSIS — Z01419 Encounter for gynecological examination (general) (routine) without abnormal findings: Secondary | ICD-10-CM | POA: Diagnosis not present

## 2020-11-26 DIAGNOSIS — Z124 Encounter for screening for malignant neoplasm of cervix: Secondary | ICD-10-CM | POA: Diagnosis not present

## 2020-11-26 DIAGNOSIS — Z6841 Body Mass Index (BMI) 40.0 and over, adult: Secondary | ICD-10-CM | POA: Diagnosis not present

## 2020-11-26 DIAGNOSIS — E569 Vitamin deficiency, unspecified: Secondary | ICD-10-CM | POA: Diagnosis not present

## 2020-11-26 DIAGNOSIS — Z113 Encounter for screening for infections with a predominantly sexual mode of transmission: Secondary | ICD-10-CM | POA: Diagnosis not present

## 2020-11-26 DIAGNOSIS — L659 Nonscarring hair loss, unspecified: Secondary | ICD-10-CM | POA: Diagnosis not present

## 2020-11-26 DIAGNOSIS — M791 Myalgia, unspecified site: Secondary | ICD-10-CM | POA: Diagnosis not present

## 2020-11-26 DIAGNOSIS — Z1231 Encounter for screening mammogram for malignant neoplasm of breast: Secondary | ICD-10-CM | POA: Diagnosis not present

## 2020-11-26 DIAGNOSIS — E079 Disorder of thyroid, unspecified: Secondary | ICD-10-CM | POA: Diagnosis not present

## 2020-12-08 DIAGNOSIS — F411 Generalized anxiety disorder: Secondary | ICD-10-CM | POA: Diagnosis not present

## 2020-12-14 ENCOUNTER — Encounter: Payer: Self-pay | Admitting: Family Medicine

## 2020-12-14 DIAGNOSIS — M545 Low back pain, unspecified: Secondary | ICD-10-CM

## 2020-12-22 DIAGNOSIS — F411 Generalized anxiety disorder: Secondary | ICD-10-CM | POA: Diagnosis not present

## 2021-01-05 DIAGNOSIS — F411 Generalized anxiety disorder: Secondary | ICD-10-CM | POA: Diagnosis not present

## 2021-01-15 ENCOUNTER — Encounter: Payer: Self-pay | Admitting: Family Medicine

## 2021-01-20 ENCOUNTER — Other Ambulatory Visit: Payer: Self-pay

## 2021-01-20 ENCOUNTER — Ambulatory Visit: Payer: BC Managed Care – PPO | Admitting: Physical Therapy

## 2021-01-26 DIAGNOSIS — F411 Generalized anxiety disorder: Secondary | ICD-10-CM | POA: Diagnosis not present

## 2021-02-02 DIAGNOSIS — F411 Generalized anxiety disorder: Secondary | ICD-10-CM | POA: Diagnosis not present

## 2021-02-11 ENCOUNTER — Encounter: Payer: Self-pay | Admitting: Family Medicine

## 2021-02-11 DIAGNOSIS — M545 Low back pain, unspecified: Secondary | ICD-10-CM

## 2021-02-11 NOTE — Telephone Encounter (Signed)
I called Meagan [name] and spoke with her.  Plan to refer to physical therapy at [facility].

## 2021-02-15 ENCOUNTER — Encounter: Payer: Self-pay | Admitting: Family Medicine

## 2021-02-16 DIAGNOSIS — F411 Generalized anxiety disorder: Secondary | ICD-10-CM | POA: Diagnosis not present

## 2021-02-17 ENCOUNTER — Other Ambulatory Visit: Payer: Self-pay

## 2021-02-17 ENCOUNTER — Ambulatory Visit: Payer: BC Managed Care – PPO | Admitting: Family Medicine

## 2021-02-17 VITALS — BP 132/74 | HR 84 | Temp 98.2°F | Resp 17 | Ht 60.0 in | Wt 246.4 lb

## 2021-02-17 DIAGNOSIS — F411 Generalized anxiety disorder: Secondary | ICD-10-CM | POA: Diagnosis not present

## 2021-02-17 DIAGNOSIS — M2550 Pain in unspecified joint: Secondary | ICD-10-CM | POA: Diagnosis not present

## 2021-02-17 DIAGNOSIS — R609 Edema, unspecified: Secondary | ICD-10-CM

## 2021-02-17 DIAGNOSIS — R7989 Other specified abnormal findings of blood chemistry: Secondary | ICD-10-CM

## 2021-02-17 LAB — SEDIMENTATION RATE: Sed Rate: 38 mm/hr — ABNORMAL HIGH (ref 0–20)

## 2021-02-17 LAB — COMPREHENSIVE METABOLIC PANEL
ALT: 51 U/L — ABNORMAL HIGH (ref 0–35)
AST: 38 U/L — ABNORMAL HIGH (ref 0–37)
Albumin: 4.4 g/dL (ref 3.5–5.2)
Alkaline Phosphatase: 70 U/L (ref 39–117)
BUN: 14 mg/dL (ref 6–23)
CO2: 25 mEq/L (ref 19–32)
Calcium: 9.6 mg/dL (ref 8.4–10.5)
Chloride: 103 mEq/L (ref 96–112)
Creatinine, Ser: 0.93 mg/dL (ref 0.40–1.20)
GFR: 73.85 mL/min (ref >60.00)
Glucose, Bld: 81 mg/dL (ref 70–99)
Potassium: 4.4 mEq/L (ref 3.5–5.1)
Sodium: 138 mEq/L (ref 135–145)
Total Bilirubin: 0.5 mg/dL (ref 0.2–1.2)
Total Protein: 7.1 g/dL (ref 6.0–8.3)

## 2021-02-17 LAB — CBC
HCT: 38.3 % (ref 36.0–46.0)
Hemoglobin: 12.8 g/dL (ref 12.0–15.0)
MCHC: 33.5 g/dL (ref 30.0–36.0)
MCV: 80 fl (ref 78.0–100.0)
Platelets: 384 10*3/uL (ref 150.0–400.0)
RBC: 4.79 Mil/uL (ref 3.87–5.11)
RDW: 14.1 % (ref 11.5–15.5)
WBC: 8.7 10*3/uL (ref 4.0–10.5)

## 2021-02-17 LAB — TSH: TSH: 1.56 u[IU]/mL (ref 0.35–4.50)

## 2021-02-17 LAB — URIC ACID: Uric Acid, Serum: 7.8 mg/dL — ABNORMAL HIGH (ref 2.4–7.0)

## 2021-02-17 NOTE — Progress Notes (Signed)
Subjective:  Patient ID: Meagan [name], female    DOB: 12-13-1975  Age: 45 y.o. MRN: [id number]  CC:  Chief Complaint  Patient presents with   Joint Pain    Pt reports chronic joint pain, this has been long standing issue but is now worse and more frequent.    Edema    Pt reports swelling in bilateral feet and hands, pt reports on and off for 2 months, notes no known common factors     HPI Meagan [name] presents for   Polyarthralgia: Multiple joints, longstanding issue.  To be more frequent as above.  She has been under the care of sports medicine with Dr. Lynne Leader for lumbar pain, sciatica with bulging disc - PT starts on Monday. Did not tolerate gabapentin (rebound pain and  swelling) or Lyrica (depression symptoms - felt down, irritable, swelling) - not taken in past few months. Some persistent restless leg symptoms. No relief with gabapentin with one trial recently.  Shoulders, hips, knees, feet - persistnet aches - hurts to sit, stand, sleep. Some shooting pains in hands at time. Sleeping and sitting more sore past year.  Anxious about health. Has been meeting with therapist every week to weekly.  Prozac takes the edge off - manageable. Prior zoloft, wellbutrin, lexapro - did not work well. Mom had fibromyalgia, no personal history.   Lab Results  Component Value Date   TSH 2.330 02/05/2020  Taking medication daily. Synthroid 77mg qd.  Had tsh at OBGYN recently - told was ok. 1.33 on 11/26/20.  No new hot or cold intolerance. No new hair or skin changes, heart palpitations or new fatigue. No new weight changes.  Insomnia discussed with GYN in March - better.  CBC, CMP with obgyn - borderline ALT 36. Vit D normal.  Weight gain with covid.  Overwhelmed with the weight gain. Met with [facility] in past. Some shame with weight. Denies emotional eating at this time. Cost prohibitive for healthy weight and wellness as not meeting her deductible. Trouble getting out of work  for visits.  Overwhelmed. Not depressed.  Starting [employer] - 3 weeks ago - 2 times per week. Sore after workouts. Has intact ROM and able to move, just sore. No joint swelling.  Had covid twice.most recently in January. Pain seems to be present since that time. Mom died around that time as well.  On CPAP in past - not using recently as last sleep study indicated resolved sleep apnea. Not tired. No prior meds for RLS.    Tx: alleve 2 per day - starting 2 days ago - some improvement with feet elevation.    Peripheral edema Reports swelling of bilateral feet and hands, on and off for 2 months. Initially with gabpantin and lyrica. [place] on [place]  More past 2 weeks - hard to get ring off.  No chest pain/dyspnea.  More swelling at end of day, better with extremity elevation.      History Patient Active Problem List   Diagnosis Date Noted   Bilateral knee pain 03/17/2018   Gastroesophageal reflux disease 03/02/2018   Seasonal allergic rhinitis 03/02/2018   Acquired hypothyroidism 03/02/2018   RLD (ruptured lumbar disc) 03/02/2018   Generalized anxiety disorder 09/03/2017   Obesity 09/03/2017   OSA on CPAP 02/09/2015   Uncontrolled narcolepsy 04/03/2013   Hypersomnia, persistent 01/11/2013   Past Medical History:  Diagnosis Date   Acquired hypothyroidism 03/02/2018   Allergy  Seasonal   Anxiety    Complication of anesthesia    post -op nausea and vomiting at age 27   Constipation, chronic    Depression    Family history of anesthesia complication    cousin woke-up during procedure   GERD (gastroesophageal reflux disease)    Narcolepsy    Ruptured lumbar disc    L5   Sleep apnea    wears CPAP nightly   Sleep paralysis    at times   Uncontrolled narcolepsy 04/03/2013   Diagnosed 2002 , by Dr Baird Lyons. Meanwhile  Medications have become ineffective and patient gained 100 pounds, developed OSA, now on CPAP 9 cm, MSLT to follow in 04-19-13.    Vivid  dream    Past Surgical History:  Procedure Laterality Date   CHOLECYSTECTOMY N/A 01/01/2014   Procedure: LAPAROSCOPIC CHOLECYSTECTOMY WITH INTRAOPERATIVE CHOLANGIOGRAM;  Surgeon: Imogene Burn. Georgette Dover, MD;  Location: [place];  Service: General;  Laterality: N/A;   TONSILLECTOMY AND ADENOIDECTOMY  1981   age 61   VAGINAL DELIVERY     Allergies  Allergen Reactions   Amoxicillin Itching and Rash   Prior to Admission medications   Medication Sig Start Date End Date Taking? Authorizing Provider  albuterol (VENTOLIN HFA) 108 (90 Base) MCG/ACT inhaler Inhale 1-2 puffs into the lungs every 4 (four) hours as needed for wheezing or shortness of breath. 02/05/20  Yes Wendie Agreste, MD  FLUoxetine (PROZAC) 10 MG tablet Take 1 tablet (10 mg total) by mouth daily. 02/05/20  Yes Wendie Agreste, MD  levothyroxine (SYNTHROID) 50 MCG tablet Take 1 tablet (50 mcg total) by mouth daily. 02/05/20  Yes Wendie Agreste, MD  montelukast (SINGULAIR) 10 MG tablet TAKE 1 TABLET BY MOUTH EVERYDAY AT BEDTIME 05/04/20  Yes Wendie Agreste, MD  Multiple Vitamins-Minerals (MULTIVITAMIN ADULT PO) multivitamin   Yes [provider]  omeprazole (PRILOSEC) 20 MG capsule TAKE 1 CAPSULE (20 MG TOTAL) BY MOUTH DAILY AS NEEDED. 10/10/19  Yes Wendie Agreste, MD  predniSONE (DELTASONE) 50 MG tablet Take 1 tablet (50 mg total) by mouth daily. Take if not better 10/07/20  Yes Gregor Hams, MD  pregabalin (LYRICA) 75 MG capsule Take 1 capsule (75 mg total) by mouth 2 (two) times daily as needed (nerve pain). 10/23/20  Yes Gregor Hams, MD  desogestrel-ethinyl estradiol (MIRCETTE) 0.15-0.02/0.01 MG (21/5) tablet Kariva (28) 0.15 mg-0.02 mg (21)/0.01 mg (5) tablet  Take 1 tablet every day by oral route. Patient not taking: No sig reported    [provider]   Social History   Socioeconomic History   Marital status: Married    Spouse name: Not on file   Number of children: 1   Years of education: some col.   Highest  education level: Not on file  Occupational History    Employer: [employer]    Comment: [employer]  Tobacco Use   Smoking status: Former    Pack years: 0.00    Types: Cigarettes    Start date: 09/05/2001   Smokeless tobacco: Never   Tobacco comments:    10 years ago  Substance and Sexual Activity   Alcohol use: Yes    Comment: occasionally,one 3-7 oz of wine per week   Drug use: No   Sexual activity: Not Currently    Birth control/protection: Pill    Comment: kariva  Other Topics Concern   Not on file  Social History Narrative   Patient is  single and lives at home, her daughter lives with her.   Patient has one child.   Patient is working full-time.   Patient has some college education.   Patient is left handed.   Patient does not drink any caffeine.   Social Determinants of Health   Financial Resource Strain: Not on file  Food Insecurity: Not on file  Transportation Needs: Not on file  Physical Activity: Not on file  Stress: Not on file  Social Connections: Not on file  Intimate Partner Violence: Not on file    Review of Systems Per HPI.   Objective:   Vitals:   02/17/21 1145  BP: 132/74  Pulse: 84  Resp: 17  Temp: 98.2 F (36.8 C)  TempSrc: Temporal  SpO2: 97%  Weight: 246 lb 6.4 oz (111.8 kg)  Height: 5' (1.524 m)     Physical Exam Vitals reviewed.  Constitutional:      General: She is not in acute distress.    Appearance: Normal appearance. She is well-developed. She is obese. She is not ill-appearing or diaphoretic.  HENT:     Head: Normocephalic and atraumatic.  Eyes:     Conjunctiva/sclera: Conjunctivae normal.     Pupils: Pupils are equal, round, and reactive to light.  Neck:     Vascular: No carotid bruit.  Cardiovascular:     Rate and Rhythm: Normal rate and regular rhythm.     Heart sounds: Normal heart sounds. No murmur heard.   No friction rub. No gallop.  Pulmonary:     Effort: Pulmonary effort is normal.      Breath sounds: Normal breath sounds. No wheezing or rales.  Abdominal:     Palpations: Abdomen is soft. There is no pulsatile mass.  Musculoskeletal:     Right lower leg: No edema.     Left lower leg: No edema.     Comments: Intact range of motion of shoulders, elbows, wrists, knees, ankles.  Slight crepitance of bilateral knees.  No focal joint swelling appreciated.  No rash.  Trace nonpitting edema at ankles only.   Skin:    General: Skin is warm and dry.  Neurological:     Mental Status: She is alert and oriented to person, place, and time.  Psychiatric:        Mood and Affect: Mood is anxious (at times during visit, tearful with discussion of weight gain.).        Speech: Speech normal.        Behavior: Behavior normal.     39 minutes spent during visit, including chart review, counseling and assimilation of information, exam, discussion of plan, outside record review, and chart completion.    Assessment & Plan:  Meagan [name] is a 45 y.o. female . Polyarthralgia - Plan: Sedimentation rate, Rheumatoid Factor, Antinuclear Antib (ANA), Uric acid  Peripheral edema - Plan: Comp Met (CMET), TSH, CBC  Generalized anxiety disorder  Elevated LFTs - Plan: Comp Met (CMET)  Suspect component of arthralgias with worsening may be was weight gain during pandemic.  Edema may also be related to weight gain as improves overnight.  No cardiac symptoms, lungs were clear.  Previous TSH, CBC, CMP overall reassuring except borderline LFT.  We will repeat testing today with progressive symptoms but likely will be normal.  We will also check sed rate, ANA, RA, uric acid for polyarthralgia but unlikely autoimmune/inflammatory cause.  Question whether this may be related to her previous COVID infection.  Possible fibromyalgia in differential.  Recommended continued meeting with her therapist.  Consider change to Cymbalta.  Check labs first, then will need transition dosing from Prozac.  3-week follow-up.   RTC/ER precautions.  No orders of the defined types were placed in this encounter. Signed,   Merri Ray, MD [place], [facility] 02/17/21 1:22 PM

## 2021-02-17 NOTE — Patient Instructions (Signed)
I would consider meeting with healthy weight and wellness if

## 2021-02-19 LAB — ANA: Anti Nuclear Antibody (ANA): POSITIVE — AB

## 2021-02-19 LAB — ANTI-NUCLEAR AB-TITER (ANA TITER): ANA Titer 1: 1:40 {titer} — ABNORMAL HIGH

## 2021-02-19 LAB — RHEUMATOID FACTOR: Rheumatoid fact SerPl-aCnc: <14 IU/mL (ref <14)

## 2021-02-21 ENCOUNTER — Encounter: Payer: Self-pay | Admitting: Family Medicine

## 2021-02-22 DIAGNOSIS — M545 Low back pain, unspecified: Secondary | ICD-10-CM | POA: Diagnosis not present

## 2021-02-22 DIAGNOSIS — M6281 Muscle weakness (generalized): Secondary | ICD-10-CM | POA: Diagnosis not present

## 2021-02-22 DIAGNOSIS — M5136 Other intervertebral disc degeneration, lumbar region: Secondary | ICD-10-CM | POA: Diagnosis not present

## 2021-02-23 ENCOUNTER — Encounter: Payer: Self-pay | Admitting: Family Medicine

## 2021-02-23 DIAGNOSIS — M2550 Pain in unspecified joint: Secondary | ICD-10-CM

## 2021-02-23 DIAGNOSIS — R700 Elevated erythrocyte sedimentation rate: Secondary | ICD-10-CM

## 2021-02-23 DIAGNOSIS — R768 Other specified abnormal immunological findings in serum: Secondary | ICD-10-CM

## 2021-02-23 DIAGNOSIS — R7689 Other specified abnormal immunological findings in serum: Secondary | ICD-10-CM

## 2021-02-26 ENCOUNTER — Ambulatory Visit: Payer: BC Managed Care – PPO | Admitting: Family Medicine

## 2021-02-26 DIAGNOSIS — M545 Low back pain, unspecified: Secondary | ICD-10-CM | POA: Diagnosis not present

## 2021-02-26 DIAGNOSIS — M5136 Other intervertebral disc degeneration, lumbar region: Secondary | ICD-10-CM | POA: Diagnosis not present

## 2021-02-26 DIAGNOSIS — M6281 Muscle weakness (generalized): Secondary | ICD-10-CM | POA: Diagnosis not present

## 2021-03-01 ENCOUNTER — Other Ambulatory Visit: Payer: Self-pay | Admitting: Family Medicine

## 2021-03-01 DIAGNOSIS — E039 Hypothyroidism, unspecified: Secondary | ICD-10-CM

## 2021-03-01 DIAGNOSIS — F411 Generalized anxiety disorder: Secondary | ICD-10-CM

## 2021-03-02 DIAGNOSIS — M6281 Muscle weakness (generalized): Secondary | ICD-10-CM | POA: Diagnosis not present

## 2021-03-02 DIAGNOSIS — M5136 Other intervertebral disc degeneration, lumbar region: Secondary | ICD-10-CM | POA: Diagnosis not present

## 2021-03-02 DIAGNOSIS — F411 Generalized anxiety disorder: Secondary | ICD-10-CM | POA: Diagnosis not present

## 2021-03-02 DIAGNOSIS — M545 Low back pain, unspecified: Secondary | ICD-10-CM | POA: Diagnosis not present

## 2021-03-05 ENCOUNTER — Encounter: Payer: Self-pay | Admitting: Family Medicine

## 2021-03-05 DIAGNOSIS — M5136 Other intervertebral disc degeneration, lumbar region: Secondary | ICD-10-CM | POA: Diagnosis not present

## 2021-03-05 DIAGNOSIS — M545 Low back pain, unspecified: Secondary | ICD-10-CM | POA: Diagnosis not present

## 2021-03-05 DIAGNOSIS — M6281 Muscle weakness (generalized): Secondary | ICD-10-CM | POA: Diagnosis not present

## 2021-03-10 MED ORDER — LORAZEPAM 0.5 MG PO TABS: ORAL_TABLET | ORAL | 0 refills | Status: DC

## 2021-03-14 ENCOUNTER — Other Ambulatory Visit: Payer: Self-pay

## 2021-03-14 ENCOUNTER — Ambulatory Visit (INDEPENDENT_AMBULATORY_CARE_PROVIDER_SITE_OTHER): Payer: BC Managed Care – PPO

## 2021-03-14 DIAGNOSIS — M545 Low back pain, unspecified: Secondary | ICD-10-CM | POA: Diagnosis not present

## 2021-03-14 DIAGNOSIS — G8929 Other chronic pain: Secondary | ICD-10-CM

## 2021-03-15 ENCOUNTER — Ambulatory Visit: Payer: BC Managed Care – PPO | Admitting: Family Medicine

## 2021-03-15 VITALS — BP 126/78 | HR 92 | Temp 98.2°F | Resp 16 | Ht 60.0 in | Wt 242.6 lb

## 2021-03-15 DIAGNOSIS — R609 Edema, unspecified: Secondary | ICD-10-CM | POA: Diagnosis not present

## 2021-03-15 DIAGNOSIS — M2550 Pain in unspecified joint: Secondary | ICD-10-CM

## 2021-03-15 DIAGNOSIS — F411 Generalized anxiety disorder: Secondary | ICD-10-CM | POA: Diagnosis not present

## 2021-03-15 MED ORDER — FUROSEMIDE 20 MG PO TABS: 20.0000 mg | ORAL_TABLET | Freq: Every day | ORAL | 0 refills | Status: DC | PRN

## 2021-03-15 MED ORDER — MELOXICAM 7.5 MG PO TABS: 7.5000 mg | ORAL_TABLET | Freq: Every day | ORAL | 1 refills | Status: DC

## 2021-03-15 MED ORDER — DULOXETINE HCL 30 MG PO CPEP: 30.0000 mg | ORAL_CAPSULE | Freq: Every day | ORAL | 1 refills | Status: DC

## 2021-03-15 NOTE — Progress Notes (Signed)
Subjective:  Patient ID: Meagan [name], female    DOB: [birth year]  Age: 45 y.o. MRN: [id number]  CC:  Chief Complaint  Patient presents with   Edema    Pt reports no improvement to swelling or joint pain, discuss lab results.    Anxiety    Pt reports medication works well, has had some anxiety around cause for recent pains but doing well with daily stress and anxiety     HPI Meagan [name] presents for   Polyarthralgia with edema Discussed June 15.  Both shoulders, knees, feet.  Thought to have some component of worsening arthralgias with pandemic  and weight gain, and question regarding previous COVID infection and impact on her symptoms. Edema thought to be with weight gain as the edema did improve overnight.  No cardiac symptoms at that time, lungs were clear.    Differential included fibromyalgia.  Discussed possible change to Cymbalta, but continued on Prozac initially.  Positive ANA with ANA titer of 1:4, sed rate mildly elevated at 38, normal rheumatoid factor, slight elevated uric acid at 7.8.  She was referred to rheumatology, appointment with Dr. Benjamine Mola on 04/07/2021.  She was taking Aleve 2/day at last visit, was able to start [facility] few weeks prior, some soreness after her workouts.  Trying to lose weight.  Back has been sore - under care of Dr. Georgina Snell. MRI yesterday  Has appt for follow up Monday with Dr. Georgina Snell.  MRI: IMPRESSION: 1. Multilevel degenerative changes of the lumbar spine as described above, worst at L4-L5 where there is moderate to severe spinal canal and right lateral recess stenosis.   Tx: advil - helps most joint pains, not sciatica. Similar with alleve. Not taking frequently to prevent rebound pain No relief in past with topical diclofenac.  Mobic worked well in past - had been on daily for some time.  No hx of PUD or CKD.  Lab Results  Component Value Date   CREATININE 0.93 02/17/2021     Edema: About the same as last visit cut salt out of diet  and sugar containing beverages. Cooking at home.  Improves at night at times, sometimes swelling remains.  No new CP/dyspnea.   Generalized anxiety disorder Treated with Prozac, 10 mg daily, with option of changes Cymbalta as above.  Feels like anxiety is overall stable, some increase in anxiety with arthralgias as above. Would like to try cymbalta.   GAD 7 : Generalized Anxiety Score 03/15/2021 02/17/2021 05/14/2020  Nervous, Anxious, on Edge _0 Control/stop worrying 0 1 1  Worry too much - different things 0 2 3  Trouble relaxing _1 Restless 0 1 3  Easily annoyed or irritable 0 2 3  Afraid - awful might happen 0 2 1  Total GAD 7 Score _2 Anxiety Difficulty - - Not difficult at all       History Patient Active Problem List   Diagnosis Date Noted   Bilateral knee pain 03/17/2018   Gastroesophageal reflux disease 03/02/2018   Seasonal allergic rhinitis 03/02/2018   Acquired hypothyroidism 03/02/2018   RLD (ruptured lumbar disc) 03/02/2018   Generalized anxiety disorder 09/03/2017   Obesity 09/03/2017   OSA on CPAP 02/09/2015   Uncontrolled narcolepsy 04/03/2013   Hypersomnia, persistent 01/11/2013   Past Medical History:  Diagnosis Date   Acquired hypothyroidism 03/02/2018   Allergy    Seasonal   Anxiety    Complication of anesthesia  post -op nausea and vomiting at age 89   Constipation, chronic    Depression    Family history of anesthesia complication    cousin woke-up during procedure   GERD (gastroesophageal reflux disease)    Narcolepsy    Ruptured lumbar disc    L5   Sleep apnea    wears CPAP nightly   Sleep paralysis    at times   Uncontrolled narcolepsy 04/03/2013   Diagnosed 2002 , by Dr Baird Lyons. Meanwhile  Medications have become ineffective and patient gained 100 pounds, developed OSA, now on CPAP 9 cm, MSLT to follow in 04-19-13.    Vivid dream    Past Surgical History:  Procedure Laterality Date   CHOLECYSTECTOMY N/A 01/01/2014    Procedure: LAPAROSCOPIC CHOLECYSTECTOMY WITH INTRAOPERATIVE CHOLANGIOGRAM;  Surgeon: Imogene Burn. Georgette Dover, MD;  Location: [place];  Service: General;  Laterality: N/A;   TONSILLECTOMY AND ADENOIDECTOMY  1981   age 18   VAGINAL DELIVERY     Allergies  Allergen Reactions   Amoxicillin Itching and Rash   Prior to Admission medications   Medication Sig Start Date End Date Taking? Authorizing Provider  albuterol (VENTOLIN HFA) 108 (90 Base) MCG/ACT inhaler Inhale 1-2 puffs into the lungs every 4 (four) hours as needed for wheezing or shortness of breath. 02/05/20  Yes Wendie Agreste, MD  FLUoxetine (PROZAC) 10 MG tablet TAKE 1 TABLET BY MOUTH EVERY DAY 03/01/21  Yes Wendie Agreste, MD  levothyroxine (SYNTHROID) 50 MCG tablet TAKE 1 TABLET BY MOUTH EVERY DAY 03/01/21  Yes Wendie Agreste, MD  LORazepam (ATIVAN) 0.5 MG tablet 1-2 tabs 30 - 60 min prior to MRI. Do not drive with this medicine. 03/10/21  Yes Gregor Hams, MD  montelukast (SINGULAIR) 10 MG tablet TAKE 1 TABLET BY MOUTH EVERYDAY AT BEDTIME 05/04/20  Yes Wendie Agreste, MD  Multiple Vitamins-Minerals (MULTIVITAMIN ADULT PO) multivitamin   Yes [provider]  desogestrel-ethinyl estradiol (MIRCETTE) 0.15-0.02/0.01 MG (21/5) tablet Kariva (28) 0.15 mg-0.02 mg (21)/0.01 mg (5) tablet  Take 1 tablet every day by oral route. Patient not taking: No sig reported    [provider]  omeprazole (PRILOSEC) 20 MG capsule TAKE 1 CAPSULE (20 MG TOTAL) BY MOUTH DAILY AS NEEDED. 10/10/19   Wendie Agreste, MD  predniSONE (DELTASONE) 50 MG tablet Take 1 tablet (50 mg total) by mouth daily. Take if not better 10/07/20   Gregor Hams, MD  pregabalin (LYRICA) 75 MG capsule Take 1 capsule (75 mg total) by mouth 2 (two) times daily as needed (nerve pain). 10/23/20   Gregor Hams, MD   Social History   Socioeconomic History   Marital status: Married    Spouse name: Not on file   Number of children: 1   Years of education: some col.    Highest education level: Not on file  Occupational History    Employer: [employer]    Comment: [employer]  Tobacco Use   Smoking status: Former    Pack years: 0.00    Types: Cigarettes    Start date: 09/05/2001   Smokeless tobacco: Never   Tobacco comments:    10 years ago  Substance and Sexual Activity   Alcohol use: Yes    Comment: occasionally,one 3-7 oz of wine per week   Drug use: No   Sexual activity: Not Currently    Birth control/protection: Pill    Comment: kariva  Other Topics Concern  Not on file  Social History Narrative   Patient is single and lives at home, her daughter lives with her.   Patient has one child.   Patient is working full-time.   Patient has some college education.   Patient is left handed.   Patient does not drink any caffeine.   Social Determinants of Health   Financial Resource Strain: Not on file  Food Insecurity: Not on file  Transportation Needs: Not on file  Physical Activity: Not on file  Stress: Not on file  Social Connections: Not on file  Intimate Partner Violence: Not on file    Review of Systems Per HPI.   Objective:   Vitals:   03/15/21 1338  BP: 126/78  Pulse: 92  Resp: 16  Temp: 98.2 F (36.8 C)  TempSrc: Temporal  SpO2: 95%  Weight: 242 lb 9.6 oz (110 kg)  Height: 5' (1.524 m)     Physical Exam Constitutional:      General: She is not in acute distress.    Appearance: Normal appearance. She is well-developed. She is obese. She is not ill-appearing or diaphoretic.  HENT:     Head: Normocephalic and atraumatic.  Cardiovascular:     Rate and Rhythm: Normal rate.     Heart sounds: No murmur heard.   No gallop.  Pulmonary:     Effort: Pulmonary effort is normal.     Breath sounds: Normal breath sounds. No wheezing, rhonchi or rales.  Musculoskeletal:     Right lower leg: Edema (Trace, nonpitting lower third of tibia, with some nonpitting edema of feet bilaterally.) present.     Left  lower leg: Edema present.     Comments: Full range of motion of shoulders, knees, hands, without focal joint swelling noted or erythema.  Neurological:     Mental Status: She is alert and oriented to person, place, and time.  Psychiatric:        Mood and Affect: Mood normal.       Assessment & Plan:  Meagan [name] is a 45 y.o. female . Polyarthralgia - Plan: meloxicam (MOBIC) 7.5 MG tablet, DULoxetine (CYMBALTA) 30 MG capsule  -Persistent shoulder, knee, hip pain, hand pain.  No significant joint swelling seen.  Could be multifactorial including degenerative changes, adjustment continue exercise regimen, fibromyalgia.  Mild elevated ESR, planned rheumatology follow-up.  Trial of change to Cymbalta as SNRI may be helpful as well as restarted Mobic temporarily.  RTC precautions.    -Additionally keep follow-up with Dr. Georgina Snell with sports medicine, MRI lumbar spine noted.  Peripheral edema - Plan: furosemide (LASIX) 20 MG tablet  -Stable, option of intermittent/low-dose furosemide if needed for increased edema with RTC precautions if new or worsening symptoms  Generalized anxiety disorder - Plan: DULoxetine (CYMBALTA) 30 MG capsule  -Overall stable anxiety, but will try low-dose Cymbalta initially 30 mg daily, then twice daily if tolerated.  Start at 30 mg as stopping low-dose fluoxetine.  Possible side effects of meds discussed.   Meds ordered this encounter  Medications   meloxicam (MOBIC) 7.5 MG tablet    Sig: Take 1 tablet (7.5 mg total) by mouth daily.    Dispense:  30 tablet    Refill:  1   DULoxetine (CYMBALTA) 30 MG capsule    Sig: Take 1 capsule (30 mg total) by mouth daily.    Dispense:  30 capsule    Refill:  1   furosemide (LASIX) 20 MG tablet    Sig: Take 1  tablet (20 mg total) by mouth daily as needed for edema. For increased swelling.    Dispense:  30 tablet    Refill:  0   Patient Instructions  Occasional furosemide if needed for more swelling. Make sure to eat  potassium rich foods if you do take furosemide. Can restart meloxicam once per day for now.  Do not combine that with Advil or Aleve.  Watch for any signs of stomach upset, dark stools or blood in stools and be seen right away if that occurs.   Keep follow-up with rheumatologist as planned as well as follow-up with Dr. Georgina Snell  We can try changing fluoxetine to Cymbalta.  Should start at low-dose of 30 mg once daily for now.  Can increase to twice per day after a week or so if not effective but watch for new side effects as we discussed.  Follow-up with me in 6 weeks, sooner if needed.  Hang in there!  Return to the clinic or go to the nearest emergency room if any of your symptoms worsen or new symptoms occur.    Signed,   Merri Ray, MD [place], [facility] 03/15/21 2:40 PM

## 2021-03-15 NOTE — Patient Instructions (Addendum)
Occasional furosemide if needed for more swelling. Make sure to eat potassium rich foods if you do take furosemide. Can restart meloxicam once per day for now.  Do not combine that with Advil or Aleve.  Watch for any signs of stomach upset, dark stools or blood in stools and be seen right away if that occurs.   Keep follow-up with rheumatologist as planned as well as follow-up with Dr. Denyse Amass  We can try changing fluoxetine to Cymbalta.  Should start at low-dose of 30 mg once daily for now.  Can increase to twice per day after a week or so if not effective but watch for new side effects as we discussed.  Follow-up with me in 6 weeks, sooner if needed.  Hang in there!  Return to the clinic or go to the nearest emergency room if any of your symptoms worsen or new symptoms occur.

## 2021-03-16 NOTE — Progress Notes (Signed)
MRI lumbar spine shows multiple levels of arthritis that could produce pain as well as potential for pinched nerve at L4-5 that could cause pain going down your legs.  Recommend keeping the appointment you have scheduled with me on the 18th.  At that time we can go over the results in full detail discussed treatment plan and options in further detail.

## 2021-03-22 ENCOUNTER — Ambulatory Visit (INDEPENDENT_AMBULATORY_CARE_PROVIDER_SITE_OTHER): Payer: BC Managed Care – PPO | Admitting: Family Medicine

## 2021-03-22 ENCOUNTER — Other Ambulatory Visit: Payer: Self-pay

## 2021-03-22 VITALS — BP 122/82 | HR 96 | Ht 60.0 in | Wt 241.0 lb

## 2021-03-22 DIAGNOSIS — M545 Low back pain, unspecified: Secondary | ICD-10-CM | POA: Diagnosis not present

## 2021-03-22 DIAGNOSIS — M5416 Radiculopathy, lumbar region: Secondary | ICD-10-CM

## 2021-03-22 NOTE — Patient Instructions (Addendum)
Thank you for coming in today.   Please call [facility] at [phone] to schedule your spine injection.    If this shot does not help we can try a facet joint injection with radiology.   If that does not help I can try to inject the Lateral Femoral Cutaneous Nerve in the front of the hip for possible meralgia paresthetica which could also cause burning numb stinging pain in the thigh.   Epidural Steroid Injection  An epidural steroid injection is a shot of steroid medicine and numbing medicine that is given into the space between the spinal cord and the bones of the back (epidural space). The shot helps relieve pain caused by an irritated or swollen nerve root. The amount of pain relief you get from the injection depends on what is causing the nerve to be swollen and irritated, and how long your pain lasts. You are more likely to benefit from this injection if your pain is strong and comes on suddenly rather than if you have had long-term (chronic) pain. Tell a health care provider about: Any allergies you have. All medicines you are taking, including vitamins, herbs, eye drops, creams, and over-the-counter medicines. Any problems you or family members have had with anesthetic medicines. Any blood disorders you have. Any surgeries you have had. Any medical conditions you have. Whether you are pregnant or may be pregnant. What are the risks? Generally, this is a safe procedure. However, problems may occur, including: Headache. Bleeding. Infection. Allergic reaction to medicines. Nerve damage. What happens before the procedure? Staying hydrated Follow instructions from your health care provider about hydration, which may include: Up to 2 hours before the procedure - you may continue to drink clear liquids, such as water, clear fruit juice, black coffee, and plain tea. Eating and drinking restrictions Follow instructions from your health care provider about eating and drinking,  which may include: 8 hours before the procedure - stop eating heavy meals or foods, such as meat, fried foods, or fatty foods. 6 hours before the procedure - stop eating light meals or foods, such as toast or cereal. 6 hours before the procedure - stop drinking milk or drinks that contain milk. 2 hours before the procedure - stop drinking clear liquids. Medicines You may be given medicines to lower anxiety. Ask your health care provider about: Changing or stopping your regular medicines. This is especially important if you are taking diabetes medicines or blood thinners. Taking medicines such as aspirin and ibuprofen. These medicines can thin your blood. Do not take these medicines unless your health care provider tells you to take them. Taking over-the-counter medicines, vitamins, herbs, and supplements. General instructions Ask your health care provider what steps will be taken to prevent infection. Plan to have a responsible adult take you home from the hospital or clinic. If you will be going home right after the procedure, plan to have a responsible adult care for you for the time you are told. This is important. What happens during the procedure? An IV will be inserted into one of your veins. You will be given one or more of the following: A medicine to help you relax (sedative). A medicine to numb the area (local anesthetic). You will be asked to lie on your abdomen or sit. The injection site will be cleaned. A needle will be inserted through your skin into the epidural space. This may cause you some discomfort. An X-ray machine will be used to guide the needle as close  as possible to the affected nerve. A steroid medicine and a local anesthetic will be injected into the epidural space. The needle and IV will be removed. A bandage (dressing) will be put over the injection site. The procedure may vary among health care providers and hospitals. What can I expect after the  procedure? Your blood pressure, heart rate, breathing rate, and blood oxygen level will be monitored until you leave the hospital or clinic. Your arm or leg may feel weak or numb for a few hours. The injection site may feel sore. Follow these instructions at home: Injection site care You may remove the bandage (dressing) after 24 hours. Check your injection site every day for signs of infection. Check for: Redness, swelling, or pain. Fluid or blood. Warmth. Pus or a bad smell. Managing pain, stiffness, and swelling For 24 hours after the procedure: Avoid using heat on the injection site. Do not take baths, swim, or use a hot tub until your health care provider approves. Ask your health care provider if you may take showers. You may only be allowed to take sponge baths. If directed, put ice on the injection site. To do this: Put ice in a plastic bag. Place a towel between your skin and the bag. Leave the ice on for 20 minutes, 2-3 times a day.  Activity If you were given a sedative during the procedure, it can affect you for several hours. Do not drive or operate machinery until your health care provider says that it is safe. Return to your normal activities as told by your health care provider. Ask your health care provider what activities are safe for you. General instructions Take over-the-counter and prescription medicines only as told by your health care provider. Drink enough fluid to keep your urine pale yellow. Keep all follow-up visits as told by your health care provider. This is important. Contact a health care provider if: You have any of these signs of infection: Redness, swelling, or pain around your injection site. Fluid or blood coming from your injection site. Warmth coming from your injection site. Pus or a bad smell coming from your injection site. A fever. You continue to have pain and soreness around the injection site, even after taking over-the-counter pain  medicine. You have severe, sudden, or lasting nausea or vomiting. Get help right away if: You have severe pain at the injection site that is not relieved by medicines. You develop a severe headache or a stiff neck. You become sensitive to light. You have any new numbness or weakness in your legs or arms. You lose control of your bladder or bowel movements. You have trouble breathing. Summary An epidural steroid injection is a shot of steroid medicine and numbing medicine that is given into the epidural space. The shot helps relieve pain caused by an irritated or swollen nerve root. You are more likely to benefit from this injection if your pain is strong and comes on suddenly rather than if you have had chronic pain. This information is not intended to replace advice given to you by your health care provider. Make sure you discuss any questions you have with your healthcare provider. Document Revised: 12/20/2019 Document Reviewed: 03/04/2019 Elsevier Patient Education  2022 [employer].

## 2021-03-22 NOTE — Progress Notes (Signed)
241 I, Philbert Riser, LAT, ATC acting as a scribe for Clementeen Graham, MD.  Meagan [name] is a 45 y.o. female who presents to [facility] at [facility] today for cont low back pain with pain referred to the posterior thighs bilaterally and MRI review. Pt was last seen by Dr. Denyse Amass on 10/07/20 and was prescribed gabapentin and advised to use a heating pad and TENS unit. After exchanging several MyChart message, pt was referred to a different PT location at [facility] and completed 4 visits, and a MRI was order. Today, pt reports having the most severe pain last night and did not sleep well w/ continued severity today. Pt has been cleaning out a camper and has been carrying her dog. Pt notes she feels better w/ activity and worse w/ sitting and laying.  She describes pain at the low back extending to the buttocks posterior to lateral thigh and a little bit to the area just distal to the knee.  She also has a patch of paresthesias to the lateral thigh on the right side.  Dx imaging: 03/14/21 L-spine MRI  10/07/20 L-spine XR   Pertinent review of systems: No fevers or chills  Relevant historical information: Narcolepsy.   Exam:  BP 122/82 (BP Location: Left Arm, Patient Position: Sitting, Cuff Size: Large)   Pulse 96   Ht 5' (1.524 m)   Wt 241 lb (109.3 kg)   SpO2 97%   BMI 47.07 kg/m  General: Well Developed, well nourished, and in no acute distress.   MSK: L-spine nontender midline.  Decreased lumbar motion.  Lower extremity strength is intact. Nontender right lateral greater trochanter.    Lab and Radiology Results  EXAM: MRI LUMBAR SPINE WITHOUT CONTRAST   TECHNIQUE: Multiplanar, multisequence MR imaging of the lumbar spine was performed. No intravenous contrast was administered.   COMPARISON:  Lumbar spine x-rays dated October 07, 2020.   FINDINGS: Segmentation:  Standard.   Alignment:  Physiologic.   Vertebrae:  No fracture, evidence of discitis, or  bone lesion.   Conus medullaris and cauda equina: Conus extends to the L2 level. Conus and cauda equina appear normal.   Paraspinal and other soft tissues: Negative.   Disc levels:   T10-T11: Mild disc bulging and bilateral facet arthropathy. Mild spinal canal and left neuroforaminal stenosis.   T11-T12: Mild disc bulging and bilateral facet arthropathy. Borderline mild bilateral neuroforaminal stenosis. No spinal canal stenosis.   T12-L1:  Tiny right paracentral disc protrusion.  No stenosis.   L1-L2: Tiny shallow right subarticular disc protrusion. No stenosis.   L2-L3: Tiny shallow right subarticular disc protrusion. Mild bilateral facet arthropathy. No stenosis.   L3-L4: Small broad-based posterior disc protrusion with tiny left paracentral annular fissure. Mild bilateral facet arthropathy. Mild spinal canal and right neuroforaminal stenosis. No left neuroforaminal stenosis.   L4-L5: Small broad-based posterior disc protrusion with superimposed moderate right paracentral disc protrusion. Moderate bilateral facet arthropathy. Moderate to severe spinal canal and right lateral recess stenosis. Moderate left lateral recess stenosis. Mild bilateral neuroforaminal stenosis.   L5-S1: Small broad-based posterior disc protrusion and mild bilateral facet arthropathy. Mild bilateral lateral recess and neuroforaminal stenosis. No spinal canal stenosis.   IMPRESSION: 1. Multilevel degenerative changes of the lumbar spine as described above, worst at L4-L5 where there is moderate to severe spinal canal and right lateral recess stenosis.     Electronically Signed   By: Obie Dredge M.D.   On: 03/15/2021 11:28 I, Clementeen Graham, personally (  independently) visualized and performed the interpretation of the images attached in this note.     Assessment and Plan: 45 y.o. female with right leg pain and right low back pain thought to be related to lumbar radiculopathy primarily  coming from the spinal stenosis and right neuroforaminal stenosis at L4-L5.  She certainly does have other potential sources of pain generators in her lumbar spine based on MRI as above.  Plan for epidural steroid injection.  If this does not help much with potentially try facet injections and if that does not help much would consider trial of ultrasound-guided injection at lateral femoral cutaneous nerve to treat potential meralgia paresthetica.  Discussed MRI findings and treatment plan and options with patient expresses understanding and agreement.  Patient will keep me updated. Total encounter time 20 minutes including face-to-face time with the patient and, reviewing past medical record, and charting on the date of service.      PDMP not reviewed this encounter. Orders Placed This Encounter  Procedures   DG INJECT DIAG/THERA/INC NEEDLE/CATH/PLC EPI/LUMB/SAC W/IMG    Standing Status:   Future    Standing Expiration Date:   03/22/2022    Order Specific Question:   Reason for Exam (SYMPTOM  OR DIAGNOSIS REQUIRED)    Answer:   rt leg pain and low back. Level and technque per radiology. Sugest L4L5    Order Specific Question:   Is the patient pregnant?    Answer:   No    Order Specific Question:   Preferred Imaging Location?    Answer:   [id number]    Order Specific Question:   Radiology Contrast Protocol - do NOT remove file path    Answer:   [email or web address]   No orders of the defined types were placed in this encounter.    Discussed warning signs or symptoms. Please see discharge instructions. Patient expresses understanding.   The above documentation has been reviewed and is accurate and complete Clementeen Graham, M.D.

## 2021-03-23 ENCOUNTER — Encounter: Payer: Self-pay | Admitting: Family Medicine

## 2021-03-23 DIAGNOSIS — R110 Nausea: Secondary | ICD-10-CM

## 2021-03-23 MED ORDER — ONDANSETRON 4 MG PO TBDP: 4.0000 mg | ORAL_TABLET | Freq: Three times a day (TID) | ORAL | 0 refills | Status: DC | PRN

## 2021-03-24 ENCOUNTER — Ambulatory Visit
Admission: RE | Admit: 2021-03-24 | Discharge: 2021-03-24 | Disposition: A | Discharge status: Home / Self Care | Payer: BC Managed Care – PPO | Source: Ambulatory Visit | Attending: Family Medicine | Admitting: Family Medicine

## 2021-03-24 ENCOUNTER — Other Ambulatory Visit: Payer: Self-pay

## 2021-03-24 DIAGNOSIS — M48061 Spinal stenosis, lumbar region without neurogenic claudication: Secondary | ICD-10-CM | POA: Diagnosis not present

## 2021-03-24 DIAGNOSIS — M47817 Spondylosis without myelopathy or radiculopathy, lumbosacral region: Secondary | ICD-10-CM | POA: Diagnosis not present

## 2021-03-24 DIAGNOSIS — M5416 Radiculopathy, lumbar region: Secondary | ICD-10-CM

## 2021-03-24 DIAGNOSIS — M545 Low back pain, unspecified: Secondary | ICD-10-CM

## 2021-03-24 MED ORDER — METHYLPREDNISOLONE ACETATE 40 MG/ML INJ SUSP (RADIOLOG
80.0000 mg | Freq: Once | INTRAMUSCULAR | Status: AC
  Administered 2021-03-24: 80 mg via EPIDURAL

## 2021-03-24 MED ORDER — IOPAMIDOL (ISOVUE-M 200) INJECTION 41%
1.0000 mL | Freq: Once | INTRAMUSCULAR | Status: AC
  Administered 2021-03-24: 1 mL via EPIDURAL

## 2021-03-24 NOTE — Discharge Instructions (Signed)

## 2021-03-30 DIAGNOSIS — F411 Generalized anxiety disorder: Secondary | ICD-10-CM | POA: Diagnosis not present

## 2021-04-04 ENCOUNTER — Encounter: Payer: Self-pay | Admitting: Family Medicine

## 2021-04-06 ENCOUNTER — Encounter: Payer: Self-pay | Admitting: Family Medicine

## 2021-04-06 ENCOUNTER — Other Ambulatory Visit: Payer: Self-pay

## 2021-04-06 DIAGNOSIS — M5416 Radiculopathy, lumbar region: Secondary | ICD-10-CM

## 2021-04-06 NOTE — Progress Notes (Signed)
Office Visit Note  Patient: Meagan Meagan [name]             Meagan [name] of Birth: 1975/11/05           MRN: [id number]             PCP: Meagan Agreste, MD Referring: Meagan Agreste, MD Visit Meagan [name]: 04/07/2021 Occupation: Daycare  Subjective:  New Patient (Initial Visit) (Patient previously had generalized joint pain, swelling, and stiffness and since being on Meloxicam x 2 weeks patient notices great improvement with symptoms. )   History of Present Illness: Meagan Meagan [name] is a 45 y.o. female here for evaluation of multiple chronic joint pains with abnormal labs.  She has a chronic history of low back pain with intermittent sciatica symptoms but otherwise felt her joint symptoms were well controlled had been exercising regularly with Meagan Meagan [name].  During COVID and more recently had decrease in exercise but no acute problems.  Around January this year she was ill with COVID with mild symptoms but diffuse body aches also increased life stressor with a family death.  Since that time she has had some degree of increased symptoms slightly waxing and waning but worse with increased lower extremity joint pains and swelling in bilateral feet.  She reports this pain in the morning and at night and throughout the day no particular activity association.  She has been bilateral hip pain that worsens when lying on either side to sleep causing her to sleep more on her stomach.  She was taking a lot of Advil that was partially beneficial and more recently restarted on meloxicam from her PCP office and noticing very good benefit in symptoms.  She also underwent injection for the low back pain a few weeks ago with some local benefit there. She has a family history of chronic arthritis problems sounds like without any very clear diagnosis involving at least her mother brother and daughter.  Laboratory work-up for this problem was weakly positive for ANA and mildly elevated sedimentation rate.   Labs reviewed 02/2021 ANA  1:40 mitotic/spindle RF neg ESR 38 Uric acid 7.8 CBC wnl CMP AST 38 ALT 51 TSH 1.56  Imaging reviewed 03/14/21 MRI Lumbar spine IMPRESSION: 1. Multilevel degenerative changes of the lumbar spine as described above, worst at L4-L5 where there is moderate to severe spinal canal and right lateral recess stenosis.  Activities of Daily Living:  Patient reports morning stiffness for 24 hours.   Patient Denies nocturnal pain.  Difficulty dressing/grooming: Denies Difficulty climbing stairs: Denies Difficulty getting out of chair: Denies Difficulty using hands for taps, buttons, cutlery, and/or writing: Denies  Review of Systems  Constitutional:  Positive for fatigue.  HENT:  Negative for mouth sores, mouth dryness and nose dryness.   Eyes:  Positive for itching. Negative for pain, visual disturbance and dryness.  Respiratory:  Negative for cough, hemoptysis, shortness of breath and difficulty breathing.   Cardiovascular:  Positive for chest pain and palpitations. Negative for swelling in legs/feet.  Gastrointestinal:  Positive for constipation. Negative for abdominal pain, blood in stool and diarrhea.  Endocrine: Negative for increased urination.  Genitourinary:  Negative for painful urination.  Musculoskeletal:  Positive for morning stiffness. Negative for joint pain, joint pain, joint swelling, myalgias, muscle weakness, muscle tenderness and myalgias.  Skin:  Positive for color change. Negative for rash and redness.  Allergic/Immunologic: Positive for susceptible to infections.  Neurological:  Positive for dizziness, numbness, headaches, memory loss and weakness.  Hematological:  Positive for swollen glands.  Psychiatric/Behavioral:  Positive for confusion. Negative for sleep disturbance.    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   Generalized anxiety disorder 09/03/2017   Obesity 09/03/2017   OSA on CPAP 02/09/2015   Hypersomnia, persistent 01/11/2013    Past Medical History:  Diagnosis Meagan [name]   Acquired hypothyroidism 03/02/2018   Allergy    Seasonal   Anxiety    Complication of anesthesia    post -op nausea and vomiting at age 58   Constipation, chronic    Depression    Family history of anesthesia complication    cousin woke-up during procedure   GERD (gastroesophageal reflux disease)    Narcolepsy    Ruptured lumbar disc    L5   Sleep apnea    wears CPAP nightly   Sleep paralysis    at times   Uncontrolled narcolepsy 04/03/2013   Diagnosed 2002 , by Dr Baird Lyons. Meanwhile  Medications have become ineffective and patient gained 100 pounds, developed OSA, now on CPAP 9 cm, MSLT to follow in 04-19-13.    Vivid dream     Family History  Problem Relation Age of Onset   Alcohol abuse Mother    Depression Mother    Anxiety disorder Mother    Sleep apnea Mother    Alcoholism Mother    Diabetes Mother    Alcohol abuse Father    Alcoholism Father    Alcohol abuse Brother    Alcoholism Brother    Multiple sclerosis Maternal Grandmother    Breast cancer Paternal Grandmother    Healthy Daughter    Past Surgical History:  Procedure Laterality Meagan [name]   CHOLECYSTECTOMY N/A 01/01/2014   Procedure: LAPAROSCOPIC CHOLECYSTECTOMY WITH INTRAOPERATIVE CHOLANGIOGRAM;  Surgeon: Meagan Burn. Meagan Dover, MD;  Location: [facility];  Service: General;  Laterality: N/A;   TONSILLECTOMY AND ADENOIDECTOMY  1981   age 39   VAGINAL DELIVERY     Social History   Social History Narrative   Patient is single and lives at home, her daughter lives with her.   Patient has one child.   Patient is working full-time.   Patient has some college education.   Patient is left handed.   Patient does not drink any caffeine.    Immunization History  Administered Meagan [name](s) Administered   Janssen (J&J) SARS-COV-2 Vaccination 12/14/2019     Objective: Vital Signs: BP 116/80 (BP Location: Right Arm, Patient Position: Sitting, Cuff Size: Large)   Pulse 98   Ht _0  (1.549 m)   Wt 241 lb (109.3 kg)   BMI 45.54 kg/m    Physical Exam   Musculoskeletal Exam:  Neck full ROM no tenderness Shoulders full ROM no tenderness or swelling Elbows full ROM no tenderness or swelling Wrists full ROM no tenderness or swelling Fingers full ROM no tenderness or swelling Normal hip internal and external rotation range of motion, lateral hip tenderness to palpation on both sides Knees full ROM with right side patellofemoral crepitus, left side mild joint line tenderness to pressure Ankles full ROM no tenderness or swelling MTPs no squeeze tenderness   Investigation: No additional findings.  Imaging: MR Lumbar Spine Wo Contrast  Result Meagan [name]: 03/15/2021 CLINICAL DATA:  Chronic low back pain radiating  into both hips and legs. Right leg numbness. No injury or prior surgery. EXAM: MRI LUMBAR SPINE WITHOUT CONTRAST TECHNIQUE: Multiplanar, multisequence MR imaging of the lumbar spine was performed. No intravenous contrast was administered. COMPARISON:  Lumbar spine x-rays dated October 07, 2020. FINDINGS: Segmentation:  Standard. Alignment:  Physiologic. Vertebrae:  No fracture, evidence of discitis, or bone lesion. Conus medullaris and cauda equina: Conus extends to the L2 level. Conus and cauda equina appear normal. Paraspinal and other soft tissues: Negative. Disc levels: T10-T11: Mild disc bulging and bilateral facet arthropathy. Mild spinal canal and left neuroforaminal stenosis. T11-T12: Mild disc bulging and bilateral facet arthropathy. Borderline mild bilateral neuroforaminal stenosis. No spinal canal stenosis. T12-L1:  Tiny right paracentral disc protrusion.  No stenosis. L1-L2: Tiny shallow right subarticular disc protrusion.  No stenosis. L2-L3: Tiny shallow right subarticular disc protrusion. Mild bilateral facet arthropathy. No stenosis. L3-L4: Small broad-based posterior disc protrusion with tiny left paracentral annular fissure. Mild bilateral facet arthropathy. Mild spinal canal and right neuroforaminal stenosis. No left neuroforaminal stenosis. L4-L5: Small broad-based posterior disc protrusion with superimposed moderate right paracentral disc protrusion. Moderate bilateral facet arthropathy. Moderate to severe spinal canal and right lateral recess stenosis. Moderate left lateral recess stenosis. Mild bilateral neuroforaminal stenosis. L5-S1: Small broad-based posterior disc protrusion and mild bilateral facet arthropathy. Mild bilateral lateral recess and neuroforaminal stenosis. No spinal canal stenosis. IMPRESSION: 1. Multilevel degenerative changes of the lumbar spine as described above, worst at L4-L5 where there is moderate to severe spinal canal and right lateral recess stenosis. Electronically Signed   By: Titus Dubin M.D.   On: 03/15/2021 11:28   DG INJECT DIAG/THERA/INC NEEDLE/CATH/PLC EPI/LUMB/SAC W/IMG  Result Meagan [name]: 03/24/2021 CLINICAL DATA:  45 year old female with lumbosacral spondylosis without myelopathy. She has multilevel degenerative changes most significant at L4-L5 where there is moderate to severe spinal canal and right lateral recess stenosis. Her symptoms include lower back pain radiating into the right lower extremity to the lateral thigh. FLUOROSCOPY TIME:  0 minutes 33 seconds 4.9 mGy PROCEDURE: The procedure, risks, benefits, and alternatives were explained to the patient. Questions regarding the procedure were encouraged and answered. The patient understands and consents to the procedure. LUMBAR EPIDURAL INJECTION: An interlaminar approach was performed on right at L4-L5. The overlying skin was cleansed and anesthetized. A 20 gauge epidural needle was advanced using loss-of-resistance technique.  DIAGNOSTIC EPIDURAL INJECTION: Injection of Isovue-M 200 shows a good epidural pattern with spread above and below the level of needle placement, primarily on the right no vascular opacification is seen. THERAPEUTIC EPIDURAL INJECTION: 40 mg of Depo-Medrol mixed with 2 mL 1% lidocaine were instilled. The procedure was well-tolerated, and the patient was discharged thirty minutes following the injection in good condition. COMPLICATIONS: None IMPRESSION: Technically successful epidural injection on the right L4-L5 #1. Electronically Signed   By: Jacqulynn Cadet M.D.   On: 03/24/2021 14:41    Recent Labs: Lab Results  Component Value Meagan [name]   WBC 8.7 02/17/2021   HGB 12.8 02/17/2021   PLT 384.0 02/17/2021   NA 138 02/17/2021   K 4.4 02/17/2021   CL 103 02/17/2021   CO2 25 02/17/2021   GLUCOSE 81 02/17/2021   BUN 14 02/17/2021   CREATININE 0.93 02/17/2021   BILITOT 0.5 02/17/2021   ALKPHOS 70 02/17/2021   AST 38 (H) 02/17/2021   ALT 51 (H) 02/17/2021   PROT 7.1 02/17/2021   ALBUMIN 4.4 02/17/2021   CALCIUM 9.6 02/17/2021   GFRAA 88 02/24/2018  Speciality Comments: No specialty comments available.  Procedures:  No procedures performed Allergies: Amoxicillin   Assessment / Plan:     Visit Diagnoses: Positive ANA (antinuclear antibody) - Plan: Anti-Smith antibody, Sjogrens syndrome-A extractable nuclear antibody, Anti-DNA antibody, double-stranded, C3 and C4, Sedimentation rate  Very low titer ANA and no specific inflammatory changes are seen on physical exam today so I have a low pretest suspicion.  We will check specific antibodies including dsDNA, Smith, SSA, serum complements, and will repeat sedimentation rate that was mildly high in June.  I discussed weak positive ANA can be seen associated with reactive symptoms such as to slow recovery of COVID infection although would want to follow-up for any highly positive test.  Chronic pain of both knees Chronic bilateral low back pain  with sciatica, sciatica laterality unspecified  Some chronic joint pain of multiple sites as well there is evidence of osteoarthritis change in imaging of the low back and on physical exam of the bilateral knees.  Agree with current meloxicam treatment for degenerative type joint pains.  I agree with PCP management to switch to SNRI therapy since this is more effective for neuropathic component of pain or chronic pain syndromes.  Orders: Orders Placed This Encounter  Procedures   Anti-Smith antibody   Sjogrens syndrome-A extractable nuclear antibody   Anti-DNA antibody, double-stranded   C3 and C4   Sedimentation rate    No orders of the defined types were placed in this encounter.    Follow-Up Instructions: No follow-ups on file.   Collier Salina, MD  Note - This record has been created using [employer].  Chart creation errors have been sought, but may not always  have been located. Such creation errors do not reflect on  the standard of medical care.

## 2021-04-07 ENCOUNTER — Ambulatory Visit: Payer: BC Managed Care – PPO | Admitting: Internal Medicine

## 2021-04-07 ENCOUNTER — Encounter: Payer: Self-pay | Admitting: Internal Medicine

## 2021-04-07 ENCOUNTER — Other Ambulatory Visit: Payer: Self-pay

## 2021-04-07 VITALS — BP 116/80 | HR 98 | Ht 61.0 in | Wt 241.0 lb

## 2021-04-07 DIAGNOSIS — M5440 Lumbago with sciatica, unspecified side: Secondary | ICD-10-CM | POA: Diagnosis not present

## 2021-04-07 DIAGNOSIS — G8929 Other chronic pain: Secondary | ICD-10-CM

## 2021-04-07 DIAGNOSIS — M545 Low back pain, unspecified: Secondary | ICD-10-CM | POA: Insufficient documentation

## 2021-04-07 DIAGNOSIS — M25562 Pain in left knee: Secondary | ICD-10-CM | POA: Diagnosis not present

## 2021-04-07 DIAGNOSIS — R768 Other specified abnormal immunological findings in serum: Secondary | ICD-10-CM

## 2021-04-07 DIAGNOSIS — M25561 Pain in right knee: Secondary | ICD-10-CM

## 2021-04-07 DIAGNOSIS — R7689 Other specified abnormal immunological findings in serum: Secondary | ICD-10-CM | POA: Insufficient documentation

## 2021-04-07 NOTE — Patient Instructions (Signed)
Anti-DNA Antibody Test Why am I having this test? The anti-DNA antibody test helps with the diagnosis and follow-up of systemic lupus erythematosus (SLE). It is also used to monitor treatment of this condition as the antibody decreases with successful therapy. What is being tested? This test measures the amount of anti-DNA antibody in the blood. This antibody is found in 65-80% of patients with active SLE. This antibody is not as common in patients who have other diseases. What kind of sample is taken? A blood sample is required for this test. It is usually collected by inserting a needle into a blood vessel. How are the results reported? Your test results will be reported as a value. Your test results may also be reported as positive, intermediate, or negative. Your health care provider will compare your results to normal ranges that were established after testing a large group of people (reference values). Reference values may vary among labs and hospitals. For this test, common reference values are: Positive: 10 or more international units/mL. Intermediate: 5-9 international units/mL. Negative: Less than 5 international units/mL. What do the results mean? Positive results, which are associated with results that are higher than the reference values, may indicate: Autoimmune disorders such as SLE. Infectious mononucleosis. Chronic liver conditions. Intermediate results mean that the anti-DNA antibody levels are higher than normal, but not high enough to be considered positive. Negative results mean that you do not have the anti-DNA antibody that is associated with these conditions. Talk with your health care provider about what your results mean. Questions to ask your health care provider Ask your health care provider, or the department that is doing the test: When will my results be ready? How will I get my results? What are my treatment options? What other tests do I need? What are my  next steps? Summary The anti-DNA antibody test helps with the diagnosis and follow-up of systemic lupus erythematosus (SLE). It is also used to monitor treatment of this condition as the antibody decreases with successful therapy. This test measures the amount of anti-DNA antibody in the blood. Elevated levels of anti-DNA antibody can be seen in patients with SLE and certain other conditions.  Complement Assay Test Why am I having this test? Complement refers to a group of proteins that are part of the body's disease-fighting system (immune system). A complement assay test provides information about some or all of these proteins. You may have this test: To diagnose a lack, or deficiency, of certain complement proteins. Deficiencies can be passed from parent to child (inherited). To monitor an infection or autoimmune disease. If you have unexplained inflammation or swelling (edema). If you have bacterial infections again and again. What is being tested? This test can be used to measure: Total complement. This is the total number of protein complements in your blood. The number of each kind of complement in your blood. The nine main kinds of complement are labeled C1 through C9. Some of these complements, such as C3 and C4, are especially important and have many functions in the body. Depending on why you are having the test, your health care provider may test your total complement or only some individual complements, such as C3 and C4. The total complement assay test may be done before individual complements are tested. What kind of sample is taken? A blood sample is required for this test. It is usually collected by inserting a needle into a blood vessel. Tell a health care provider about: Any allergies you have.   All medicines you are taking, including vitamins, herbs, eye drops, creams, and over-the-counter medicines. Any blood disorders you have. Any surgeries you have had. Any medical  conditions you have. Whether you are pregnant or may be pregnant. How are the results reported? Your results will be reported as a value that tells you how much complement is in your blood. This will be given as units per milliliter of blood (units/mL) or as milligrams per deciliter of blood (mg/dL). Your results may be reported as total complement, or as individual complements, or both. Your health care provider will compare your results to normal ranges that were established after testing a large group of people (reference ranges). Reference ranges may vary among labs and hospitals. For this test, reference ranges for some of the most commonly measured complement assays may be: Total complement: 30-75 units/mL. C2: 1-4 mg/dL. C3: 75-175 mg/dL. C4: 22-45 units/mL. What do the results mean? Results within reference ranges are considered normal, which means you have a normal amount of complement in your blood. Results that are higher than the reference ranges may be caused by: Inflammatory disease. Heart attack. Cancer. Complement deficiencies, or results lower than the reference ranges, may be caused by: Certain inherited conditions. Autoimmune disease. Certain liver diseases. Malnutrition. Certain types of anemia that result in breakdown of red blood cells (hemolytic anemia). Talk with your health care provider about what your results mean. Questions to ask your health care provider Ask your health care provider, or the department that is doing the test: When will my results be ready? How will I get my results? What are my treatment options? What other tests do I need? What are my next steps? Summary Complement refers to a group of proteins that are part of the body's disease-fighting system (immune system). A complement assay test can provide information about some or all of these proteins. You may have a complement assay test to help diagnose a complement deficiency, and to monitor  some infections or autoimmune disease. Talk with your health care provider about what your results mean. This information is not intended to replace advice given to you by your health care provider. Make sure you discuss any questions you have with your health care provider. Document Revised: 06/18/2020 Document Reviewed: 06/18/2020 Elsevier Patient Education  2022 [employer]vier Inc.   

## 2021-04-08 LAB — ANTI-SMITH ANTIBODY: ENA SM Ab Ser-aCnc: <1 AI

## 2021-04-08 LAB — C3 AND C4
C3 Complement: 178 mg/dL (ref 83–193)
C4 Complement: 31 mg/dL (ref 15–57)

## 2021-04-08 LAB — SEDIMENTATION RATE: Sed Rate: 17 mm/h (ref 0–20)

## 2021-04-08 LAB — ANTI-DNA ANTIBODY, DOUBLE-STRANDED: ds DNA Ab: 3 IU/mL

## 2021-04-08 LAB — SJOGRENS SYNDROME-A EXTRACTABLE NUCLEAR ANTIBODY: SSA (Ro) (ENA) Antibody, IgG: <1 AI

## 2021-04-10 ENCOUNTER — Other Ambulatory Visit: Payer: Self-pay | Admitting: Family Medicine

## 2021-04-10 DIAGNOSIS — F411 Generalized anxiety disorder: Secondary | ICD-10-CM

## 2021-04-10 DIAGNOSIS — M2550 Pain in unspecified joint: Secondary | ICD-10-CM

## 2021-04-26 ENCOUNTER — Ambulatory Visit: Payer: BC Managed Care – PPO | Admitting: Family Medicine

## 2021-04-27 DIAGNOSIS — F411 Generalized anxiety disorder: Secondary | ICD-10-CM | POA: Diagnosis not present

## 2021-04-27 MED ORDER — FLUOXETINE HCL 10 MG PO TABS: 10.0000 mg | ORAL_TABLET | Freq: Every day | ORAL | 1 refills | Status: DC

## 2021-04-27 NOTE — Addendum Note (Signed)
Addended by: Meredith Staggers R on: 04/27/2021 09:49 AM   Modules accepted: Orders

## 2021-04-27 NOTE — Telephone Encounter (Signed)
Pt would like to return to Prozac due to side effects while on Cymbalta.  Please send new rx to the pharmacy

## 2021-05-07 ENCOUNTER — Ambulatory Visit: Payer: BC Managed Care – PPO | Admitting: Internal Medicine

## 2021-05-11 DIAGNOSIS — F411 Generalized anxiety disorder: Secondary | ICD-10-CM | POA: Diagnosis not present

## 2021-05-20 ENCOUNTER — Other Ambulatory Visit: Payer: Self-pay | Admitting: Family Medicine

## 2021-05-20 DIAGNOSIS — M2550 Pain in unspecified joint: Secondary | ICD-10-CM

## 2021-05-22 ENCOUNTER — Other Ambulatory Visit: Payer: Self-pay | Admitting: Family Medicine

## 2021-05-22 DIAGNOSIS — F411 Generalized anxiety disorder: Secondary | ICD-10-CM

## 2021-05-22 DIAGNOSIS — M2550 Pain in unspecified joint: Secondary | ICD-10-CM

## 2021-05-24 ENCOUNTER — Encounter: Payer: Self-pay | Admitting: Obstetrics and Gynecology

## 2021-05-25 ENCOUNTER — Telehealth: Payer: Self-pay

## 2021-05-25 ENCOUNTER — Encounter: Payer: Self-pay | Admitting: Family Medicine

## 2021-05-25 DIAGNOSIS — M545 Low back pain, unspecified: Secondary | ICD-10-CM

## 2021-05-25 DIAGNOSIS — F411 Generalized anxiety disorder: Secondary | ICD-10-CM | POA: Diagnosis not present

## 2021-05-25 DIAGNOSIS — M5416 Radiculopathy, lumbar region: Secondary | ICD-10-CM

## 2021-05-25 NOTE — Telephone Encounter (Signed)
Please advise as the pt has stated she returned the call about her aptmnt being r/s as a new pt for Dr. Okey Dupre and was told she was going to be worked in and has not heard back from anyone. Pt states when she called Amy stated "she would take care of it as she remembers the pts name."  **Pt is asking that she gets a call back with her new apptmnt as soon as possible.

## 2021-05-28 ENCOUNTER — Other Ambulatory Visit: Payer: Self-pay

## 2021-05-28 DIAGNOSIS — M545 Low back pain, unspecified: Secondary | ICD-10-CM

## 2021-05-28 DIAGNOSIS — M5416 Radiculopathy, lumbar region: Secondary | ICD-10-CM

## 2021-05-28 NOTE — Progress Notes (Signed)
Order switched to [facility] per patient request

## 2021-06-01 ENCOUNTER — Ambulatory Visit
Admission: RE | Admit: 2021-06-01 | Discharge: 2021-06-01 | Disposition: A | Discharge status: Home / Self Care | Payer: BC Managed Care – PPO | Source: Ambulatory Visit | Attending: Family Medicine | Admitting: Family Medicine

## 2021-06-01 ENCOUNTER — Other Ambulatory Visit: Payer: Self-pay

## 2021-06-01 ENCOUNTER — Other Ambulatory Visit: Payer: Self-pay | Admitting: Family Medicine

## 2021-06-01 DIAGNOSIS — J309 Allergic rhinitis, unspecified: Secondary | ICD-10-CM

## 2021-06-01 DIAGNOSIS — M545 Low back pain, unspecified: Secondary | ICD-10-CM

## 2021-06-01 DIAGNOSIS — M47817 Spondylosis without myelopathy or radiculopathy, lumbosacral region: Secondary | ICD-10-CM | POA: Diagnosis not present

## 2021-06-01 DIAGNOSIS — M48061 Spinal stenosis, lumbar region without neurogenic claudication: Secondary | ICD-10-CM | POA: Diagnosis not present

## 2021-06-01 MED ORDER — IOPAMIDOL (ISOVUE-M 200) INJECTION 41%
1.0000 mL | Freq: Once | INTRAMUSCULAR | Status: AC
  Administered 2021-06-01: 1 mL via EPIDURAL

## 2021-06-01 MED ORDER — METHYLPREDNISOLONE ACETATE 40 MG/ML INJ SUSP (RADIOLOG
80.0000 mg | Freq: Once | INTRAMUSCULAR | Status: AC
  Administered 2021-06-01: 80 mg via EPIDURAL

## 2021-06-01 NOTE — Discharge Instructions (Signed)

## 2021-06-08 DIAGNOSIS — F411 Generalized anxiety disorder: Secondary | ICD-10-CM | POA: Diagnosis not present

## 2021-06-16 ENCOUNTER — Other Ambulatory Visit: Payer: Self-pay | Admitting: Physical Therapy

## 2021-06-16 DIAGNOSIS — M5416 Radiculopathy, lumbar region: Secondary | ICD-10-CM

## 2021-06-16 NOTE — Progress Notes (Signed)
a 

## 2021-06-21 ENCOUNTER — Encounter: Payer: Self-pay | Admitting: Family Medicine

## 2021-06-22 DIAGNOSIS — F411 Generalized anxiety disorder: Secondary | ICD-10-CM | POA: Diagnosis not present

## 2021-07-05 DIAGNOSIS — M6281 Muscle weakness (generalized): Secondary | ICD-10-CM | POA: Diagnosis not present

## 2021-07-05 DIAGNOSIS — M5416 Radiculopathy, lumbar region: Secondary | ICD-10-CM | POA: Diagnosis not present

## 2021-07-05 DIAGNOSIS — M25659 Stiffness of unspecified hip, not elsewhere classified: Secondary | ICD-10-CM | POA: Diagnosis not present

## 2021-07-06 DIAGNOSIS — F411 Generalized anxiety disorder: Secondary | ICD-10-CM | POA: Diagnosis not present

## 2021-07-16 ENCOUNTER — Other Ambulatory Visit: Payer: Self-pay | Admitting: Family Medicine

## 2021-07-16 DIAGNOSIS — M5416 Radiculopathy, lumbar region: Secondary | ICD-10-CM | POA: Diagnosis not present

## 2021-07-16 DIAGNOSIS — M25659 Stiffness of unspecified hip, not elsewhere classified: Secondary | ICD-10-CM | POA: Diagnosis not present

## 2021-07-16 DIAGNOSIS — M2550 Pain in unspecified joint: Secondary | ICD-10-CM

## 2021-07-16 DIAGNOSIS — M6281 Muscle weakness (generalized): Secondary | ICD-10-CM | POA: Diagnosis not present

## 2021-07-20 DIAGNOSIS — F411 Generalized anxiety disorder: Secondary | ICD-10-CM | POA: Diagnosis not present

## 2021-07-23 DIAGNOSIS — M25659 Stiffness of unspecified hip, not elsewhere classified: Secondary | ICD-10-CM | POA: Diagnosis not present

## 2021-07-23 DIAGNOSIS — M6281 Muscle weakness (generalized): Secondary | ICD-10-CM | POA: Diagnosis not present

## 2021-07-23 DIAGNOSIS — M5416 Radiculopathy, lumbar region: Secondary | ICD-10-CM | POA: Diagnosis not present

## 2021-08-03 DIAGNOSIS — F411 Generalized anxiety disorder: Secondary | ICD-10-CM | POA: Diagnosis not present

## 2021-08-09 IMAGING — XA Imaging study
2 series · 2 of 2 positions shown · non-contrast
Comparison: none

CLINICAL DATA: 45-year-old female with lumbosacral spondylosis
without myelopathy. She has multilevel degenerative changes most
significant at L4-L5 where there is moderate to severe spinal canal
and right lateral recess stenosis. Her symptoms include lower back
pain radiating into the right lower extremity to the lateral thigh.

[Series 1: ortho standard · 1 of 1 slices shown (1 of 2)]
[im 1/1]
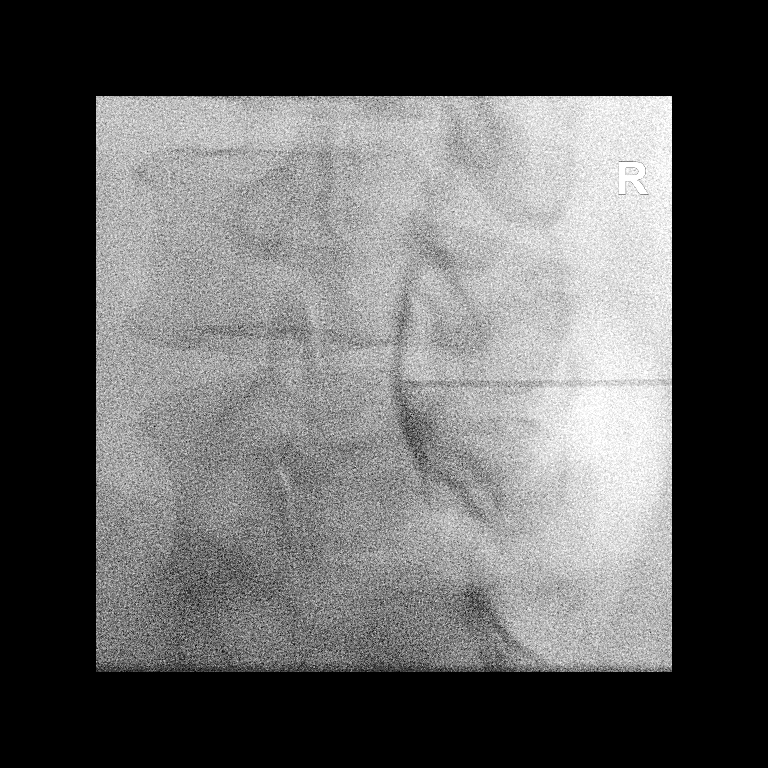

[Series 2: ortho standard · 1 of 1 slices shown (2 of 2)]
[im 1/1]
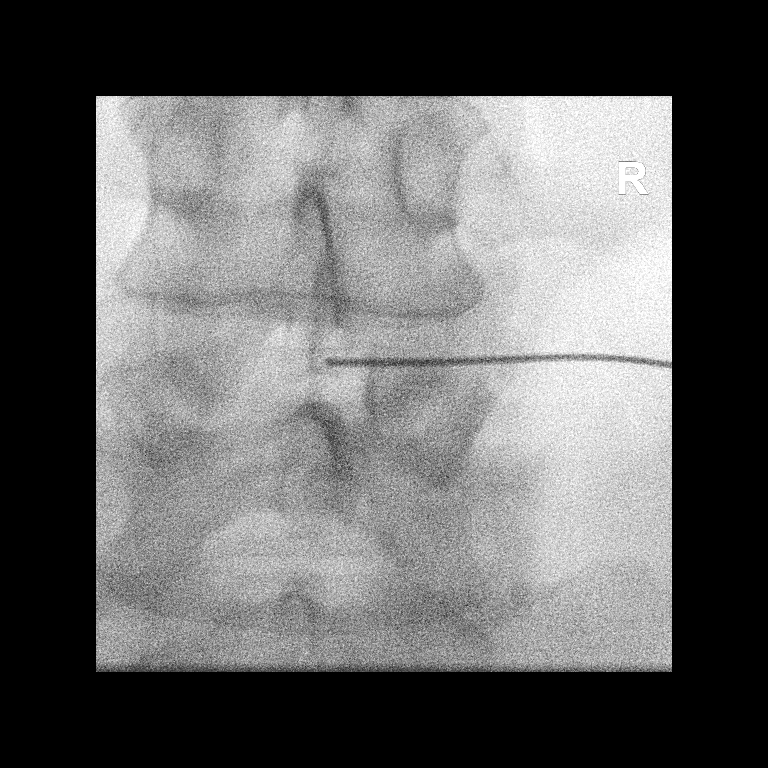

[2 of 2 positions shown; findings below may reference images not displayed]

FLUOROSCOPY TIME:  0 minutes 33 seconds 4.9 mGy

PROCEDURE:
The procedure, risks, benefits, and alternatives were explained to
the patient. Questions regarding the procedure were encouraged and
answered. The patient understands and consents to the procedure.

LUMBAR EPIDURAL INJECTION:

An interlaminar approach was performed on right at L4-L5. The
overlying skin was cleansed and anesthetized. A 20 gauge epidural
needle was advanced using loss-of-resistance technique.

DIAGNOSTIC EPIDURAL INJECTION:

Injection of Isovue-M 200 shows a good epidural pattern with spread
above and below the level of needle placement, primarily on the
right no vascular opacification is seen.

THERAPEUTIC EPIDURAL INJECTION:

40 mg of Depo-Medrol mixed with 2 mL 1% lidocaine were instilled.
The procedure was well-tolerated, and the patient was discharged
thirty minutes following the injection in good condition.

COMPLICATIONS:
None
IMPRESSION: Technically successful epidural injection on the right L4-L5 #1.

## 2021-08-17 DIAGNOSIS — F902 Attention-deficit hyperactivity disorder, combined type: Secondary | ICD-10-CM | POA: Diagnosis not present

## 2021-08-17 DIAGNOSIS — F411 Generalized anxiety disorder: Secondary | ICD-10-CM | POA: Diagnosis not present

## 2021-08-23 ENCOUNTER — Encounter: Payer: Self-pay | Admitting: Family Medicine

## 2021-08-23 ENCOUNTER — Telehealth (INDEPENDENT_AMBULATORY_CARE_PROVIDER_SITE_OTHER): Payer: BC Managed Care – PPO | Admitting: Family Medicine

## 2021-08-23 DIAGNOSIS — M2550 Pain in unspecified joint: Secondary | ICD-10-CM

## 2021-08-23 DIAGNOSIS — E039 Hypothyroidism, unspecified: Secondary | ICD-10-CM

## 2021-08-23 DIAGNOSIS — Z6841 Body Mass Index (BMI) 40.0 and over, adult: Secondary | ICD-10-CM | POA: Diagnosis not present

## 2021-08-23 DIAGNOSIS — F411 Generalized anxiety disorder: Secondary | ICD-10-CM | POA: Diagnosis not present

## 2021-08-23 MED ORDER — MELOXICAM 7.5 MG PO TABS: 7.5000 mg | ORAL_TABLET | Freq: Every day | ORAL | 1 refills | Status: DC

## 2021-08-23 MED ORDER — BUSPIRONE HCL 5 MG PO TABS
5.0000 mg | ORAL_TABLET | Freq: Two times a day (BID) | ORAL | 1 refills | Status: DC
Start: 2021-08-23 — End: 2021-09-16

## 2021-08-23 MED ORDER — LEVOTHYROXINE SODIUM 50 MCG PO TABS: 50.0000 ug | ORAL_TABLET | Freq: Every day | ORAL | 1 refills | Status: DC

## 2021-08-23 MED ORDER — FLUOXETINE HCL 10 MG PO TABS: 5.0000 mg | ORAL_TABLET | Freq: Every day | ORAL | 1 refills | Status: DC

## 2021-08-23 NOTE — Progress Notes (Signed)
Virtual Visit via Telephone Note  I connected with Meagan [name] on 08/23/21 at 5:16 PM by telephone and verified that I am speaking with the correct person using two identifiers. Changed to phone encounter after multiple "error null" messages on attempted [facility] connection.  Patient location: at [employer] - private area.  My location: office [place]    I discussed the limitations, risks, security and privacy concerns of performing an evaluation and management service by telephone and the availability of in person appointments. I also discussed with the patient that there may be a patient responsible charge related to this service. The patient expressed understanding and agreed to proceed, consent obtained  Chief complaint: Chief Complaint  Patient presents with   Follow-up    Meds review     History of Present Illness:   HPI Meagan [name] presents for   Polyarthralgia: Last discussed in July.  Accompanied with edema.  Had been under the care of sports medicine for her back pain, and treated by physical therapy.  Multilevel degenerative changes of lumbar spine had been noted on MRI.  Previous positive ANA with titer of 1-4, sed rate mildly elevated at 38 with normal rheumatoid factor, borderline elevated uric acid of 7.8.  Referred to rheumatology.  Appointment with Dr. Dimple Casey on August 3.  Normal anti-DNA double-stranded, anti-Smith antibody, C3-4, sed rate, and Sjogren's antibody.  Note reviewed, weak positive ANA can be associated with reactive symptoms including slow recovery of COVID infection.  Plan to continue meloxicam for degenerative type joint pains.  Attempted Cymbalta but was not tolerated. Mobic 7.5mg  qd. Working well. PT helped for back pain primarily - plans to return in January.   Generalized anxiety disorder Last discussed in July.  Prozac 10 mg daily at that time.  Some increased anxiety with arthralgias.  We did try Cymbalta but unfortunately that  was not tolerated.  Initial nausea had improved but had significant constipation.  Ultimately decided to switch back to Prozac.  Slow taper with some possible withdrawal symptoms, brain zaps, dizziness on phone message October.  Decided to not start back on Prozac after weaning off Cymbalta.  Prozac helped anxiety, but felt unemotional.  Family and coworkers have noticed that anxiety more present. Has d/w her therapist about other options for anxiety. Meeting with her therapist every other week. Feels like chemical issue. More tearful.  No change with symptoms on wellbutrin in past.  Felt ok on prozac 5mg  qd.   Depression screen [facility] 2/9 03/15/2021 02/17/2021 09/24/2020 05/14/2020 02/05/2020  Decreased Interest 0 0 0 0 0  Down, Depressed, Hopeless 0 1 0 0 0  PHQ - 2 Score 0 1 0 0 0  Altered sleeping 0 0 - - -  Tired, decreased energy 1 1 - - -  Change in appetite 2 2 - - -  Feeling bad or failure about yourself  1 1 - - -  Trouble concentrating - 0 - - -  Moving slowly or fidgety/restless 0 0 - - -  Suicidal thoughts 0 0 - - -  PHQ-9 Score 4 5 - - -     Hypothyroidism: Lab Results  Component Value Date   TSH 1.56 02/17/2021  Taking medication daily.  Synthroid 50 mcg.  No new hot or cold intolerance. No new hair or skin changes, heart palpitations or new fatigue. No new weight changes.  Would like to meet with nutritionist.   Obesity: BMI 48 at June appt.  Lost hair with  keto diet. Would like to meet with nutritionist to look into other options.  Wt Readings from Last 3 Encounters:  04/07/21 241 lb (109.3 kg)  03/22/21 241 lb (109.3 kg)  03/15/21 242 lb 9.6 oz (110 kg)     Patient Active Problem List   Diagnosis Date Noted   Positive ANA (antinuclear antibody) 04/07/2021   Low back pain 04/07/2021   Bilateral knee pain 03/17/2018   Seasonal allergic rhinitis 03/02/2018   Acquired hypothyroidism 03/02/2018   RLD (ruptured lumbar disc) 03/02/2018   Generalized anxiety disorder  09/03/2017   Obesity 09/03/2017   OSA on CPAP 02/09/2015   Hypersomnia, persistent 01/11/2013   Past Medical History:  Diagnosis Date   Acquired hypothyroidism 03/02/2018   Allergy    Seasonal   Anxiety    Complication of anesthesia    post -op nausea and vomiting at age 45   Constipation, chronic    Depression    Family history of anesthesia complication    cousin woke-up during procedure   GERD (gastroesophageal reflux disease)    Narcolepsy    Ruptured lumbar disc    L5   Sleep apnea    wears CPAP nightly   Sleep paralysis    at times   Uncontrolled narcolepsy 04/03/2013   Diagnosed 2002 , by Dr Jetty Duhamel. Meanwhile  Medications have become ineffective and patient gained 100 pounds, developed OSA, now on CPAP 9 cm, MSLT to follow in 04-19-13.    Vivid dream    Past Surgical History:  Procedure Laterality Date   CHOLECYSTECTOMY N/A 01/01/2014   Procedure: LAPAROSCOPIC CHOLECYSTECTOMY WITH INTRAOPERATIVE CHOLANGIOGRAM;  Surgeon: Wilmon Arms. Corliss Skains, MD;  Location: [facility] OR;  Service: General;  Laterality: N/A;   TONSILLECTOMY AND ADENOIDECTOMY  1981   age 40   VAGINAL DELIVERY     Allergies  Allergen Reactions   Amoxicillin Itching and Rash   Prior to Admission medications   Medication Sig Start Date End Date Taking? Authorizing Provider  levothyroxine (SYNTHROID) 50 MCG tablet TAKE 1 TABLET BY MOUTH EVERY DAY 03/01/21  Yes [name], MD  meloxicam (MOBIC) 7.5 MG tablet TAKE 1 TABLET BY MOUTH EVERY DAY 07/16/21  Yes [name], MD  montelukast (SINGULAIR) 10 MG tablet TAKE 1 TABLET BY MOUTH EVERYDAY AT BEDTIME 06/01/21  Yes [name], MD  Multiple Vitamins-Minerals (MULTIVITAMIN ADULT PO) multivitamin   Yes [provider]   Social History   Socioeconomic History   Marital status: Married    Spouse name: Not on file   Number of children: 1   Years of education: some col.   Highest education level: Not on file  Occupational History     Employer: [employer]    Comment: [employer]  Tobacco Use   Smoking status: Former    Types: Cigarettes    Start date: 09/05/2001   Smokeless tobacco: Never   Tobacco comments:    10 years ago  Vaping Use   Vaping Use: Never used  Substance and Sexual Activity   Alcohol use: Yes    Comment: occasionally,one 3-7 oz of wine per week   Drug use: No   Sexual activity: Not Currently    Birth control/protection: Pill    Comment: kariva  Other Topics Concern   Not on file  Social History Narrative   Patient is single and lives at home, her daughter lives with her.   Patient has one child.   Patient is working full-time.  Patient has some college education.   Patient is left handed.   Patient does not drink any caffeine.   Social Determinants of Health   Financial Resource Strain: Not on file  Food Insecurity: Not on file  Transportation Needs: Not on file  Physical Activity: Not on file  Stress: Not on file  Social Connections: Not on file  Intimate Partner Violence: Not on file     Observations/Objective: No distress on phone, speaking full sentences, euthymic mood.  Appropriate responses.  All questions were answered with understanding of plan expressed.  Assessment and Plan: Generalized anxiety disorder - Plan: FLUoxetine (PROZAC) 10 MG tablet, busPIRone (BUSPAR) 5 MG tablet  -Recurrence of anxiety off SSRI.  May benefit from retrying fluoxetine at low-dose of 5 mg daily, or option of low-dose BuSpar.  Both were prescribed as option, as combination may also be needed.  Start with one of the other, low-dose, and update on symptoms in the next few weeks.  In office eval in 3 months.   Polyarthralgia - Plan: meloxicam (MOBIC) 7.5 MG tablet Stable on meloxicam, continue same  Hypothyroidism, unspecified type - Plan: levothyroxine (SYNTHROID) 50 MCG tablet Denies new symptoms, hair changes with keto diet only.  Continue same dose Synthroid with repeat TSH at  follow-up in 3 months.  RTC precautions if new symptoms sooner.  BMI 45.0-49.9, adult [facility]) Refer to nutritionist, option of healthy weight and wellness phone number provided as well.  Follow Up Instructions: 47-month in person, update by MyChart next few weeks.   I discussed the assessment and treatment plan with the patient. The patient was provided an opportunity to ask questions and all were answered. The patient agreed with the plan and demonstrated an understanding of the instructions.   The patient was advised to call back or seek an in-person evaluation if the symptoms worsen or if the condition fails to improve as anticipated.  I provided 22 minutes of non-face-to-face time during this encounter.  Signed,   Meredith Staggers, MD [facility] at [facility].  08/23/21

## 2021-08-23 NOTE — Patient Instructions (Addendum)
Try restarting the Prozac at 5 mg/day.  If you are not noticing some improvement in symptoms in the next 3 to 4 weeks, or you want to try a different approach I did send the buspirone to your pharmacy as well.  I would start taking that once per day for a week or 2 then increase to twice per day if tolerated.  Give me an update on your symptoms in 6 weeks either way.   No change in synthroid or mobic for now. Recheck in 3 months.   Here is a resource for weight loss, but I will refer you to nutritionist.  [facility]  [phone]

## 2021-08-23 NOTE — Progress Notes (Deleted)
Subjective:  Patient ID: Meagan [name], female    DOB: 05-25-1976  Age: 45 y.o. MRN: [id number]  CC:  Chief Complaint  Patient presents with   Follow-up    Meds review    HPI Meagan [name] presents for   Polyarthralgia: Last discussed in July.  Accompanied with edema.  Had been under the care of sports medicine for her back pain, and treated by physical therapy.  Multilevel degenerative changes of lumbar spine had been noted on MRI.  Previous positive ANA with titer of 1-4, sed rate mildly elevated at 38 with normal rheumatoid factor, borderline elevated uric acid of 7.8.  Referred to rheumatology.  Appointment with Dr. Dimple Casey on August 3.  Normal anti-DNA double-stranded, anti-Smith antibody, C3-4, sed rate, and Sjogren's antibody.  Note reviewed, weak positive ANA can be associated with reactive symptoms including slow recovery of COVID infection.  Plan to continue meloxicam for degenerative type joint pains.  Attempted Cymbalta but was not tolerated.   Generalized anxiety disorder Last discussed in July.  Prozac 10 mg daily at that time.  Some increased anxiety with arthralgias.  We did try Cymbalta but unfortunately that was not tolerated.  Initial nausea had improved but had significant constipation.  Ultimately decided to switch back to Prozac.  Slow taper with some possible withdrawal symptoms, brain zaps, dizziness on phone message October.  Currently taking   Hypothyroidism: Lab Results  Component Value Date   TSH 1.56 02/17/2021  Taking medication daily.  Synthroid 50 mcg.  No new hot or cold intolerance. No new hair or skin changes, heart palpitations or new fatigue. No new weight changes.       History Patient Active Problem List   Diagnosis Date Noted   Positive ANA (antinuclear antibody) 04/07/2021   Low back pain 04/07/2021   Bilateral knee pain 03/17/2018   Seasonal allergic rhinitis 03/02/2018   Acquired hypothyroidism 03/02/2018   RLD (ruptured lumbar disc)  03/02/2018   Generalized anxiety disorder 09/03/2017   Obesity 09/03/2017   OSA on CPAP 02/09/2015   Hypersomnia, persistent 01/11/2013   Past Medical History:  Diagnosis Date   Acquired hypothyroidism 03/02/2018   Allergy    Seasonal   Anxiety    Complication of anesthesia    post -op nausea and vomiting at age 40   Constipation, chronic    Depression    Family history of anesthesia complication    cousin woke-up during procedure   GERD (gastroesophageal reflux disease)    Narcolepsy    Ruptured lumbar disc    L5   Sleep apnea    wears CPAP nightly   Sleep paralysis    at times   Uncontrolled narcolepsy 04/03/2013   Diagnosed 2002 , by Dr Jetty Duhamel. Meanwhile  Medications have become ineffective and patient gained 100 pounds, developed OSA, now on CPAP 9 cm, MSLT to follow in 04-19-13.    Vivid dream    Past Surgical History:  Procedure Laterality Date   CHOLECYSTECTOMY N/A 01/01/2014   Procedure: LAPAROSCOPIC CHOLECYSTECTOMY WITH INTRAOPERATIVE CHOLANGIOGRAM;  Surgeon: Wilmon Arms. Corliss Skains, MD;  Location: [facility] OR;  Service: General;  Laterality: N/A;   TONSILLECTOMY AND ADENOIDECTOMY  1981   age 41   VAGINAL DELIVERY     Allergies  Allergen Reactions   Amoxicillin Itching and Rash   Prior to Admission medications   Medication Sig Start Date End Date Taking? Authorizing Provider  levothyroxine (SYNTHROID) 50 MCG tablet TAKE 1 TABLET BY MOUTH EVERY DAY  03/01/21  Yes [name], MD  meloxicam (MOBIC) 7.5 MG tablet TAKE 1 TABLET BY MOUTH EVERY DAY 07/16/21  Yes [name], MD  montelukast (SINGULAIR) 10 MG tablet TAKE 1 TABLET BY MOUTH EVERYDAY AT BEDTIME 06/01/21  Yes [name], MD  Multiple Vitamins-Minerals (MULTIVITAMIN ADULT PO) multivitamin   Yes [provider]   Social History   Socioeconomic History   Marital status: Married    Spouse name: Not on file   Number of children: 1   Years of education: some col.   Highest education level:  Not on file  Occupational History    Employer: [employer]    Comment: [employer]  Tobacco Use   Smoking status: Former    Types: Cigarettes    Start date: 09/05/2001   Smokeless tobacco: Never   Tobacco comments:    10 years ago  Vaping Use   Vaping Use: Never used  Substance and Sexual Activity   Alcohol use: Yes    Comment: occasionally,one 3-7 oz of wine per week   Drug use: No   Sexual activity: Not Currently    Birth control/protection: Pill    Comment: kariva  Other Topics Concern   Not on file  Social History Narrative   Patient is single and lives at home, her daughter lives with her.   Patient has one child.   Patient is working full-time.   Patient has some college education.   Patient is left handed.   Patient does not drink any caffeine.   Social Determinants of Health   Financial Resource Strain: Not on file  Food Insecurity: Not on file  Transportation Needs: Not on file  Physical Activity: Not on file  Stress: Not on file  Social Connections: Not on file  Intimate Partner Violence: Not on file    Review of Systems   Objective:  There were no vitals filed for this visit.   Physical Exam     Assessment & Plan:  Meagan [name] is a 45 y.o. female . No diagnosis found.   No orders of the defined types were placed in this encounter.  There are no Patient Instructions on file for this visit.    Signed,   Meredith Staggers, MD [place] [facility], [facility] 08/23/21 5:08 PM

## 2021-09-15 ENCOUNTER — Other Ambulatory Visit: Payer: Self-pay | Admitting: Family Medicine

## 2021-09-15 DIAGNOSIS — F411 Generalized anxiety disorder: Secondary | ICD-10-CM

## 2021-09-28 DIAGNOSIS — F902 Attention-deficit hyperactivity disorder, combined type: Secondary | ICD-10-CM | POA: Diagnosis not present

## 2021-09-28 DIAGNOSIS — F411 Generalized anxiety disorder: Secondary | ICD-10-CM | POA: Diagnosis not present

## 2021-10-11 DIAGNOSIS — D225 Melanocytic nevi of trunk: Secondary | ICD-10-CM | POA: Diagnosis not present

## 2021-10-11 DIAGNOSIS — L821 Other seborrheic keratosis: Secondary | ICD-10-CM | POA: Diagnosis not present

## 2021-10-11 DIAGNOSIS — D2262 Melanocytic nevi of left upper limb, including shoulder: Secondary | ICD-10-CM | POA: Diagnosis not present

## 2021-10-11 DIAGNOSIS — L918 Other hypertrophic disorders of the skin: Secondary | ICD-10-CM | POA: Diagnosis not present

## 2021-10-12 DIAGNOSIS — F411 Generalized anxiety disorder: Secondary | ICD-10-CM | POA: Diagnosis not present

## 2021-10-12 DIAGNOSIS — F902 Attention-deficit hyperactivity disorder, combined type: Secondary | ICD-10-CM | POA: Diagnosis not present

## 2021-10-26 DIAGNOSIS — F411 Generalized anxiety disorder: Secondary | ICD-10-CM | POA: Diagnosis not present

## 2021-10-26 DIAGNOSIS — F902 Attention-deficit hyperactivity disorder, combined type: Secondary | ICD-10-CM | POA: Diagnosis not present

## 2021-11-08 DIAGNOSIS — E039 Hypothyroidism, unspecified: Secondary | ICD-10-CM | POA: Diagnosis not present

## 2021-11-08 DIAGNOSIS — E559 Vitamin D deficiency, unspecified: Secondary | ICD-10-CM | POA: Diagnosis not present

## 2021-11-08 DIAGNOSIS — J309 Allergic rhinitis, unspecified: Secondary | ICD-10-CM | POA: Diagnosis not present

## 2021-11-08 DIAGNOSIS — K219 Gastro-esophageal reflux disease without esophagitis: Secondary | ICD-10-CM | POA: Diagnosis not present

## 2021-11-09 DIAGNOSIS — F411 Generalized anxiety disorder: Secondary | ICD-10-CM | POA: Diagnosis not present

## 2021-11-09 DIAGNOSIS — F902 Attention-deficit hyperactivity disorder, combined type: Secondary | ICD-10-CM | POA: Diagnosis not present

## 2021-11-23 ENCOUNTER — Other Ambulatory Visit: Payer: Self-pay | Admitting: Family Medicine

## 2021-11-23 DIAGNOSIS — F411 Generalized anxiety disorder: Secondary | ICD-10-CM | POA: Diagnosis not present

## 2021-11-23 DIAGNOSIS — F902 Attention-deficit hyperactivity disorder, combined type: Secondary | ICD-10-CM | POA: Diagnosis not present

## 2021-11-23 DIAGNOSIS — M2550 Pain in unspecified joint: Secondary | ICD-10-CM

## 2021-12-07 DIAGNOSIS — F902 Attention-deficit hyperactivity disorder, combined type: Secondary | ICD-10-CM | POA: Diagnosis not present

## 2021-12-07 DIAGNOSIS — F411 Generalized anxiety disorder: Secondary | ICD-10-CM | POA: Diagnosis not present

## 2021-12-21 DIAGNOSIS — F411 Generalized anxiety disorder: Secondary | ICD-10-CM | POA: Diagnosis not present

## 2021-12-21 DIAGNOSIS — F902 Attention-deficit hyperactivity disorder, combined type: Secondary | ICD-10-CM | POA: Diagnosis not present

## 2022-01-13 DIAGNOSIS — F411 Generalized anxiety disorder: Secondary | ICD-10-CM | POA: Diagnosis not present

## 2022-01-13 DIAGNOSIS — F902 Attention-deficit hyperactivity disorder, combined type: Secondary | ICD-10-CM | POA: Diagnosis not present

## 2022-01-18 DIAGNOSIS — F902 Attention-deficit hyperactivity disorder, combined type: Secondary | ICD-10-CM | POA: Diagnosis not present

## 2022-01-18 DIAGNOSIS — F411 Generalized anxiety disorder: Secondary | ICD-10-CM | POA: Diagnosis not present

## 2022-01-21 ENCOUNTER — Other Ambulatory Visit: Payer: Self-pay | Admitting: Family Medicine

## 2022-01-21 DIAGNOSIS — M2550 Pain in unspecified joint: Secondary | ICD-10-CM

## 2022-01-21 NOTE — Telephone Encounter (Signed)
Medication discussed in December.  Risks and benefits discussed, attempted Cymbalta but was not tolerated.  Mobic refilled.

## 2022-01-21 NOTE — Telephone Encounter (Signed)
Patient is requesting a refill of the following medications: Requested Prescriptions   Pending Prescriptions Disp Refills   meloxicam (MOBIC) 7.5 MG tablet [Pharmacy Med Name: MELOXICAM 7.5 MG TABLET] 30 tablet 1    Sig: TAKE 1 TABLET BY MOUTH EVERY DAY    Date of patient request: 01/21/22 Last office visit: 08/23/21 Date of last refill: 11/23/21 Last refill amount: 30

## 2022-02-15 DIAGNOSIS — F902 Attention-deficit hyperactivity disorder, combined type: Secondary | ICD-10-CM | POA: Diagnosis not present

## 2022-02-15 DIAGNOSIS — F411 Generalized anxiety disorder: Secondary | ICD-10-CM | POA: Diagnosis not present

## 2022-02-18 ENCOUNTER — Other Ambulatory Visit: Payer: Self-pay | Admitting: Family Medicine

## 2022-02-18 DIAGNOSIS — F411 Generalized anxiety disorder: Secondary | ICD-10-CM

## 2022-02-24 DIAGNOSIS — R102 Pelvic and perineal pain: Secondary | ICD-10-CM | POA: Diagnosis not present

## 2022-02-24 DIAGNOSIS — Z01411 Encounter for gynecological examination (general) (routine) with abnormal findings: Secondary | ICD-10-CM | POA: Diagnosis not present

## 2022-02-24 DIAGNOSIS — Z6841 Body Mass Index (BMI) 40.0 and over, adult: Secondary | ICD-10-CM | POA: Diagnosis not present

## 2022-02-24 DIAGNOSIS — R87810 Cervical high risk human papillomavirus (HPV) DNA test positive: Secondary | ICD-10-CM | POA: Diagnosis not present

## 2022-02-24 DIAGNOSIS — Z113 Encounter for screening for infections with a predominantly sexual mode of transmission: Secondary | ICD-10-CM | POA: Diagnosis not present

## 2022-02-24 DIAGNOSIS — Z1231 Encounter for screening mammogram for malignant neoplasm of breast: Secondary | ICD-10-CM | POA: Diagnosis not present

## 2022-03-01 DIAGNOSIS — F411 Generalized anxiety disorder: Secondary | ICD-10-CM | POA: Diagnosis not present

## 2022-03-01 DIAGNOSIS — F902 Attention-deficit hyperactivity disorder, combined type: Secondary | ICD-10-CM | POA: Diagnosis not present

## 2022-03-14 DIAGNOSIS — E559 Vitamin D deficiency, unspecified: Secondary | ICD-10-CM | POA: Diagnosis not present

## 2022-03-14 DIAGNOSIS — R5382 Chronic fatigue, unspecified: Secondary | ICD-10-CM | POA: Diagnosis not present

## 2022-03-14 DIAGNOSIS — E039 Hypothyroidism, unspecified: Secondary | ICD-10-CM | POA: Diagnosis not present

## 2022-03-14 DIAGNOSIS — Z0000 Encounter for general adult medical examination without abnormal findings: Secondary | ICD-10-CM | POA: Diagnosis not present

## 2022-03-15 ENCOUNTER — Ambulatory Visit
Admission: RE | Admit: 2022-03-15 | Discharge: 2022-03-15 | Disposition: A | Discharge status: Home / Self Care | Payer: BC Managed Care – PPO | Source: Ambulatory Visit | Attending: Family Medicine | Admitting: Family Medicine

## 2022-03-15 VITALS — BP 116/71 | HR 97 | Temp 98.3°F | Resp 18

## 2022-03-15 DIAGNOSIS — B084 Enteroviral vesicular stomatitis with exanthem: Secondary | ICD-10-CM

## 2022-03-15 DIAGNOSIS — F902 Attention-deficit hyperactivity disorder, combined type: Secondary | ICD-10-CM | POA: Diagnosis not present

## 2022-03-15 DIAGNOSIS — F411 Generalized anxiety disorder: Secondary | ICD-10-CM | POA: Diagnosis not present

## 2022-03-15 NOTE — ED Triage Notes (Signed)
Pt presents with rash on toe and palm of both hands X 3 days that is progressing.

## 2022-03-15 NOTE — Discharge Instructions (Signed)
You have hand, foot, mouth disease which is a virus that will have to run its course.  Recommend symptom control as we discussed.  Please monitor blister on toe very closely for signs of infection that include increased redness, swelling, pus.  Follow-up if this occurs.

## 2022-03-15 NOTE — ED Provider Notes (Signed)
[facility]    CSN: [id number] Arrival date & time: 03/15/22  1146      History   Chief Complaint Chief Complaint  Patient presents with   Rash    Blister on toes and fever - Entered by patient   Toe Problem    HPI Meagan [name] is a 46 y.o. female.   Patient presents with rash to palms of hands and right fourth toe that started about 3 days ago.  Rash is not itchy or painful.  She states that she also had a fever with Tmax of 101 yesterday.  Denies any other associated upper respiratory symptoms, cough, sore throat.  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   Rash   Past Medical History:  Diagnosis Date   Acquired hypothyroidism 03/02/2018   Allergy    Seasonal   Anxiety    Complication of anesthesia    post -op nausea and vomiting at age 71   Constipation, chronic    Depression    Family history of anesthesia complication    cousin woke-up during procedure   GERD (gastroesophageal reflux disease)    Narcolepsy    Ruptured lumbar disc    L5   Sleep apnea    wears CPAP nightly   Sleep paralysis    at times   Uncontrolled narcolepsy 04/03/2013   Diagnosed 2002 , by Dr Jetty Duhamel. Meanwhile  Medications have become ineffective and patient gained 100 pounds, developed OSA, now on CPAP 9 cm, MSLT to follow in 04-19-13.    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 Corliss Skains, MD;  Location: [facility] OR;  Service: General;  Laterality: N/A;   TONSILLECTOMY AND ADENOIDECTOMY  1981   age 63   VAGINAL DELIVERY      OB History     Gravida  1   Para  1   Term  1   Preterm      AB      Living  1      SAB      IAB      Ectopic      Multiple      Live Births               Home Medications    Prior to Admission medications   Medication Sig Start Date End Date Taking? Authorizing Provider  busPIRone (BUSPAR) 5 MG tablet CAN START W/1 TAB DAILY THEN INCREASE TO TWICE DAILY AFTER 1-2 WEEKS 09/16/21   [name], MD  FLUoxetine (PROZAC) 10 MG tablet TAKE 1/2 TABLET BY MOUTH DAILY 02/18/22   [name], MD  levothyroxine (SYNTHROID) 50 MCG tablet Take 1 tablet (50 mcg total) by mouth daily. 08/23/21   [name], MD  meloxicam (MOBIC) 7.5 MG tablet TAKE 1 TABLET BY MOUTH EVERY DAY 01/21/22   [name], MD  montelukast (  SINGULAIR) 10 MG tablet TAKE 1 TABLET BY MOUTH EVERYDAY AT BEDTIME 06/01/21   Wendie Agreste, MD  Multiple Vitamins-Minerals (MULTIVITAMIN ADULT PO) multivitamin    [provider]    Family History Family History  Problem Relation Age of Onset   Alcohol abuse Mother    Depression Mother    Anxiety disorder Mother    Sleep apnea Mother    Alcoholism Mother    Diabetes Mother    Alcohol abuse Father    Alcoholism Father    Alcohol abuse Brother    Alcoholism Brother    Multiple sclerosis Maternal Grandmother    Breast cancer Paternal Grandmother    Healthy Daughter     Social History Social History   Tobacco Use   Smoking status: Former    Types: Cigarettes    Start date: 09/05/2001   Smokeless tobacco: Never   Tobacco comments:    10 years ago  Vaping Use   Vaping Use: Never used  Substance Use Topics   Alcohol use: Yes    Comment: occasionally,one 3-7 oz of wine per week   Drug use: No     Allergies   Amoxicillin   Review of  Systems Review of Systems Per HPI  Physical Exam Triage Vital Signs ED Triage Vitals  Enc Vitals Group     BP 03/15/22 1214 116/71     Pulse Rate 03/15/22 1214 97     Resp 03/15/22 1214 18     Temp 03/15/22 1214 98.3 F (36.8 C)     Temp Source 03/15/22 1214 Oral     SpO2 03/15/22 1214 97 %     Weight --      Height --      Head Circumference --      Peak Flow --      Pain Score 03/15/22 1213 1     Pain Loc --      Pain Edu? --      Excl. in [place]? --    No data found.  Updated Vital Signs BP 116/71 (BP Location: Left Arm)   Pulse 97   Temp 98.3 F (36.8 C) (Oral)   Resp 18   SpO2 97%   Visual Acuity Right Eye Distance:   Left Eye Distance:   Bilateral Distance:    Right Eye Near:   Left Eye Near:    Bilateral Near:     Physical Exam Constitutional:      General: She is not in acute distress.    Appearance: Normal appearance. She is not toxic-appearing or diaphoretic.  HENT:     Head: Normocephalic and atraumatic.  Eyes:     Extraocular Movements: Extraocular movements intact.     Conjunctiva/sclera: Conjunctivae normal.  Pulmonary:     Effort: Pulmonary effort is normal.  Skin:    Comments: Multiple, scattered, vesicular, erythematous papules present throughout palms of bilateral hands.  No purulent drainage noted.  There is a larger vesicular, erythematous lesion present to dorsal surface of right toe. Lesion covers majority of top of toe.  No surrounding induration or swelling.  No purulent drainage.  Patient can wiggle toe.  Capillary refill and neurovascular intact throughout.  Neurological:     General: No focal deficit present.     Mental Status: She is alert and oriented to person, place, and time. Mental status is at baseline.  Psychiatric:        Mood and Affect: Mood normal.  Behavior: Behavior normal.        Thought Content: Thought content normal.        Judgment: Judgment normal.      UC Treatments / Results  Labs (all labs  ordered are listed, but only abnormal results are displayed) Labs Reviewed - No data to display  EKG   Radiology No results found.  Procedures Procedures (including critical care time)  Medications Ordered in UC Medications - No data to display  Initial Impression / Assessment and Plan / UC Course  I have reviewed the triage vital signs and the nursing notes.  Pertinent labs & imaging results that were available during my care of the patient were reviewed by me and considered in my medical decision making (see chart for details).     Physical exam and patient's symptoms are consistent with hand, foot, mouth disease.  There is a larger lesion to right fourth toe but it does not appear to have bacterial infection present.  Advised patient to monitor it very closely for infection and to follow-up if this occurs.  Advised patient that this is a virus that will have to run its course and she was advised of supportive care.  Fever monitoring and management discussed with patient as well.  Discussed return precautions.  Patient verbalized understanding and was agreeable with plan. Final Clinical Impressions(s) / UC Diagnoses   Final diagnoses:  Hand, foot and mouth disease     Discharge Instructions      You have hand, foot, mouth disease which is a virus that will have to run its course.  Recommend symptom control as we discussed.  Please monitor blister on toe very closely for signs of infection that include increased redness, swelling, pus.  Follow-up if this occurs.    ED Prescriptions   None    PDMP not reviewed this encounter.   Gustavus Bryant, [place] 03/15/22 1230

## 2022-03-17 DIAGNOSIS — R87810 Cervical high risk human papillomavirus (HPV) DNA test positive: Secondary | ICD-10-CM | POA: Diagnosis not present

## 2022-03-17 DIAGNOSIS — R6882 Decreased libido: Secondary | ICD-10-CM | POA: Diagnosis not present

## 2022-03-17 DIAGNOSIS — R102 Pelvic and perineal pain: Secondary | ICD-10-CM | POA: Diagnosis not present

## 2022-03-23 ENCOUNTER — Other Ambulatory Visit: Payer: Self-pay | Admitting: Family Medicine

## 2022-03-23 ENCOUNTER — Ambulatory Visit
Admission: RE | Admit: 2022-03-23 | Discharge: 2022-03-23 | Disposition: A | Discharge status: Home / Self Care | Payer: BC Managed Care – PPO | Source: Ambulatory Visit | Attending: Internal Medicine | Admitting: Internal Medicine

## 2022-03-23 VITALS — BP 125/90 | HR 95 | Temp 98.6°F | Resp 18

## 2022-03-23 DIAGNOSIS — L089 Local infection of the skin and subcutaneous tissue, unspecified: Secondary | ICD-10-CM | POA: Diagnosis not present

## 2022-03-23 DIAGNOSIS — F411 Generalized anxiety disorder: Secondary | ICD-10-CM

## 2022-03-23 MED ORDER — DOXYCYCLINE HYCLATE 100 MG PO CAPS: 100.0000 mg | ORAL_CAPSULE | Freq: Two times a day (BID) | ORAL | 0 refills | Status: DC

## 2022-03-23 NOTE — Discharge Instructions (Signed)
Your toe is infected so you are being treated with antibiotics.  Please take this with food to prevent nausea.  Also recommend that you wear sunscreen if you are going to be out in the sun as this medication can make you more prone to sunburn.  Please schedule follow-up appointment with primary care doctor for further evaluation and management.  I have attached wound care information as well if you are not able to see your primary care doctor.

## 2022-03-23 NOTE — ED Triage Notes (Signed)
Pt here for wound check to right 3rd toe. States yellow discharge has been coming from it.

## 2022-03-23 NOTE — ED Provider Notes (Signed)
[facility]    CSN: [id number] Arrival date & time: 03/23/22  3875      History   Chief Complaint Chief Complaint  Patient presents with   Blister    Infected blister from hand, foot and mouth - Entered by patient   Wound Check    HPI Meagan [name] is a 46 y.o. female.   Patient presents for concern for toe infection of right third toe.  Patient was recently seen on 7/11 for hand, foot, mouth disease.  She had blister on that toe at that time that did not show signs of infection.  She states that the blister popped yesterday and she noticed some purulent drainage from it.  Denies fever, body aches, chills.   Wound Check    Past Medical History:  Diagnosis Date   Acquired hypothyroidism 03/02/2018   Allergy    Seasonal   Anxiety    Complication of anesthesia    post -op nausea and vomiting at age 38   Constipation, chronic    Depression    Family history of anesthesia complication    cousin woke-up during procedure   GERD (gastroesophageal reflux disease)    Narcolepsy    Ruptured lumbar disc    L5   Sleep apnea    wears CPAP nightly   Sleep paralysis    at times   Uncontrolled narcolepsy 04/03/2013   Diagnosed 2002 , by Dr Jetty Duhamel. Meanwhile  Medications have become ineffective and patient gained 100 pounds, developed OSA, now on CPAP 9 cm, MSLT to follow in 04-19-13.    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 Corliss Skains, MD;  Location: [facility] OR;  Service: General;  Laterality: N/A;   TONSILLECTOMY AND ADENOIDECTOMY  1981   age 27   VAGINAL DELIVERY      OB History     Gravida  1   Para  1   Term  1   Preterm      AB      Living  1      SAB      IAB      Ectopic      Multiple      Live Births               Home Medications    Prior to Admission medications   Medication Sig Start Date End Date Taking? Authorizing Provider  doxycycline (VIBRAMYCIN) 100 MG capsule Take 1 capsule (100 mg total) by mouth 2 (two) times daily. 03/23/22  Yes Marquerite Forsman, Rolly Salter E, FNP  busPIRone (BUSPAR) 5 MG tablet CAN START W/1 TAB DAILY THEN INCREASE TO TWICE DAILY AFTER 1-2 WEEKS 09/16/21   [name], MD  FLUoxetine (PROZAC) 10 MG tablet TAKE 1/2 TABLET BY MOUTH DAILY 02/18/22   [name], MD  levothyroxine (SYNTHROID) 50 MCG tablet Take 1 tablet (50 mcg total) by mouth daily. 08/23/21   [name],  MD  meloxicam (MOBIC) 7.5 MG tablet TAKE 1 TABLET BY MOUTH EVERY DAY 01/21/22   [name], MD  montelukast (SINGULAIR) 10 MG tablet TAKE 1 TABLET BY MOUTH EVERYDAY AT BEDTIME 06/01/21   [name], MD  Multiple Vitamins-Minerals (MULTIVITAMIN ADULT PO) multivitamin    [provider]    Family History Family History  Problem Relation Age of Onset   Alcohol abuse Mother    Depression Mother    Anxiety disorder Mother    Sleep apnea Mother    Alcoholism Mother    Diabetes Mother    Alcohol abuse Father    Alcoholism Father    Alcohol abuse Brother    Alcoholism Brother    Multiple sclerosis Maternal Grandmother    Breast cancer Paternal Grandmother    Healthy Daughter     Social History Social History   Tobacco Use   Smoking status: Former    Types: Cigarettes    Start date: 09/05/2001   Smokeless tobacco: Never   Tobacco comments:    10 years ago  Vaping Use   Vaping Use: Never used  Substance Use Topics    Alcohol use: Yes    Comment: occasionally,one 3-7 oz of wine per week   Drug use: No     Allergies   Amoxicillin   Review of Systems Review of Systems Per HPI  Physical Exam Triage Vital Signs ED Triage Vitals [03/23/22 0906]  Enc Vitals Group     BP 125/90     Pulse Rate 95     Resp 18     Temp 98.6 F (37 C)     Temp Source Oral     SpO2 97 %     Weight      Height      Head Circumference      Peak Flow      Pain Score 0     Pain Loc      Pain Edu?      Excl. in GC?    No data found.  Updated Vital Signs BP 125/90 (BP Location: Right Arm)   Pulse 95   Temp 98.6 F (37 C) (Oral)   Resp 18   SpO2 97%   Visual Acuity Right Eye Distance:   Left Eye Distance:   Bilateral Distance:    Right Eye Near:   Left Eye Near:    Bilateral Near:     Physical Exam Constitutional:      General: She is not in acute distress.    Appearance: Normal appearance. She is not toxic-appearing or diaphoretic.  HENT:     Head: Normocephalic and atraumatic.  Eyes:     Extraocular Movements: Extraocular movements intact.     Conjunctiva/sclera: Conjunctivae normal.  Pulmonary:     Effort: Pulmonary effort is normal.  Feet:     Comments: Patient has a popped blister with purulent drainage that extends from directly below the nail throughout entirety of dorsal surface of right third toe that wraps around slightly to medial portion of toe.  Patient can wiggle toe and has full range of motion.  Capillary refill and pulses normal. Neurological:     General: No focal deficit present.     Mental Status: She is alert and oriented to person, place, and time. Mental status is at baseline.  Psychiatric:        Mood and Affect: Mood normal.        Behavior: Behavior normal.  Thought Content: Thought content normal.        Judgment: Judgment normal.      UC Treatments / Results  Labs (all labs ordered are listed, but only abnormal results are displayed) Labs Reviewed - No  data to display  EKG   Radiology No results found.  Procedures Procedures (including critical care time)  Medications Ordered in UC Medications - No data to display  Initial Impression / Assessment and Plan / UC Course  I have reviewed the triage vital signs and the nursing notes.  Pertinent labs & imaging results that were available during my care of the patient were reviewed by me and considered in my medical decision making (see chart for details).     Patient has cellulitis of right third toe most likely from infected blister from hand, foot, mouth disease.  Will treat with doxycycline given penicillin allergy and patient not sure if she has ever taken cephalosporins previously.  Patient advised to follow-up with primary care doctor to schedule an appointment for further evaluation and management to ensure adequate healing.  Patient does not have diabetes so this is reassuring.  Patient also given contact information for wound care if not able to get in with primary care doctor soon.  Discussed return and ER precautions.  Patient verbalized understanding and was agreeable with plan. Final Clinical Impressions(s) / UC Diagnoses   Final diagnoses:  Toe infection     Discharge Instructions      Your toe is infected so you are being treated with antibiotics.  Please take this with food to prevent nausea.  Also recommend that you wear sunscreen if you are going to be out in the sun as this medication can make you more prone to sunburn.  Please schedule follow-up appointment with primary care doctor for further evaluation and management.  I have attached wound care information as well if you are not able to see your primary care doctor.    ED Prescriptions     Medication Sig Dispense Auth. Provider   doxycycline (VIBRAMYCIN) 100 MG capsule Take 1 capsule (100 mg total) by mouth 2 (two) times daily. 20 capsule Gustavus Bryant, [place]      PDMP not reviewed this encounter.    Gustavus Bryant, [place] 03/23/22 [phone]

## 2022-03-28 DIAGNOSIS — S90424D Blister (nonthermal), right lesser toe(s), subsequent encounter: Secondary | ICD-10-CM | POA: Diagnosis not present

## 2022-03-28 DIAGNOSIS — L089 Local infection of the skin and subcutaneous tissue, unspecified: Secondary | ICD-10-CM | POA: Diagnosis not present

## 2022-03-29 DIAGNOSIS — F411 Generalized anxiety disorder: Secondary | ICD-10-CM | POA: Diagnosis not present

## 2022-03-29 DIAGNOSIS — F902 Attention-deficit hyperactivity disorder, combined type: Secondary | ICD-10-CM | POA: Diagnosis not present

## 2022-04-12 DIAGNOSIS — F411 Generalized anxiety disorder: Secondary | ICD-10-CM | POA: Diagnosis not present

## 2022-04-12 DIAGNOSIS — F902 Attention-deficit hyperactivity disorder, combined type: Secondary | ICD-10-CM | POA: Diagnosis not present

## 2022-04-25 DIAGNOSIS — E039 Hypothyroidism, unspecified: Secondary | ICD-10-CM | POA: Diagnosis not present

## 2022-04-26 DIAGNOSIS — F411 Generalized anxiety disorder: Secondary | ICD-10-CM | POA: Diagnosis not present

## 2022-04-26 DIAGNOSIS — F902 Attention-deficit hyperactivity disorder, combined type: Secondary | ICD-10-CM | POA: Diagnosis not present

## 2022-04-29 ENCOUNTER — Other Ambulatory Visit: Payer: Self-pay | Admitting: Family Medicine

## 2022-04-29 DIAGNOSIS — F411 Generalized anxiety disorder: Secondary | ICD-10-CM

## 2022-05-24 DIAGNOSIS — F411 Generalized anxiety disorder: Secondary | ICD-10-CM | POA: Diagnosis not present

## 2022-05-24 DIAGNOSIS — F902 Attention-deficit hyperactivity disorder, combined type: Secondary | ICD-10-CM | POA: Diagnosis not present

## 2022-06-07 DIAGNOSIS — F902 Attention-deficit hyperactivity disorder, combined type: Secondary | ICD-10-CM | POA: Diagnosis not present

## 2022-06-07 DIAGNOSIS — F411 Generalized anxiety disorder: Secondary | ICD-10-CM | POA: Diagnosis not present

## 2022-06-21 DIAGNOSIS — F902 Attention-deficit hyperactivity disorder, combined type: Secondary | ICD-10-CM | POA: Diagnosis not present

## 2022-06-21 DIAGNOSIS — F411 Generalized anxiety disorder: Secondary | ICD-10-CM | POA: Diagnosis not present

## 2022-07-07 DIAGNOSIS — F411 Generalized anxiety disorder: Secondary | ICD-10-CM | POA: Diagnosis not present

## 2022-07-07 DIAGNOSIS — F902 Attention-deficit hyperactivity disorder, combined type: Secondary | ICD-10-CM | POA: Diagnosis not present

## 2022-08-02 DIAGNOSIS — F411 Generalized anxiety disorder: Secondary | ICD-10-CM | POA: Diagnosis not present

## 2022-08-02 DIAGNOSIS — F902 Attention-deficit hyperactivity disorder, combined type: Secondary | ICD-10-CM | POA: Diagnosis not present

## 2022-08-16 DIAGNOSIS — F411 Generalized anxiety disorder: Secondary | ICD-10-CM | POA: Diagnosis not present

## 2022-08-16 DIAGNOSIS — F902 Attention-deficit hyperactivity disorder, combined type: Secondary | ICD-10-CM | POA: Diagnosis not present

## 2022-08-31 DIAGNOSIS — F902 Attention-deficit hyperactivity disorder, combined type: Secondary | ICD-10-CM | POA: Diagnosis not present

## 2022-08-31 DIAGNOSIS — F411 Generalized anxiety disorder: Secondary | ICD-10-CM | POA: Diagnosis not present

## 2022-09-13 DIAGNOSIS — F902 Attention-deficit hyperactivity disorder, combined type: Secondary | ICD-10-CM | POA: Diagnosis not present

## 2022-09-13 DIAGNOSIS — G894 Chronic pain syndrome: Secondary | ICD-10-CM | POA: Diagnosis not present

## 2022-09-13 DIAGNOSIS — M5136 Other intervertebral disc degeneration, lumbar region: Secondary | ICD-10-CM | POA: Diagnosis not present

## 2022-09-13 DIAGNOSIS — K219 Gastro-esophageal reflux disease without esophagitis: Secondary | ICD-10-CM | POA: Diagnosis not present

## 2022-09-13 DIAGNOSIS — E039 Hypothyroidism, unspecified: Secondary | ICD-10-CM | POA: Diagnosis not present

## 2022-09-13 DIAGNOSIS — F411 Generalized anxiety disorder: Secondary | ICD-10-CM | POA: Diagnosis not present

## 2022-09-13 DIAGNOSIS — F418 Other specified anxiety disorders: Secondary | ICD-10-CM | POA: Diagnosis not present

## 2022-09-22 ENCOUNTER — Telehealth: Payer: Self-pay

## 2022-09-22 NOTE — Telephone Encounter (Signed)
Received duplicate referral for patient- [facility] pt at time of original referral. Referring office notified.

## 2022-10-04 DIAGNOSIS — F902 Attention-deficit hyperactivity disorder, combined type: Secondary | ICD-10-CM | POA: Diagnosis not present

## 2022-10-04 DIAGNOSIS — F411 Generalized anxiety disorder: Secondary | ICD-10-CM | POA: Diagnosis not present

## 2022-10-06 DIAGNOSIS — K5900 Constipation, unspecified: Secondary | ICD-10-CM | POA: Diagnosis not present

## 2022-10-06 DIAGNOSIS — F419 Anxiety disorder, unspecified: Secondary | ICD-10-CM | POA: Diagnosis not present

## 2022-10-06 DIAGNOSIS — Z1211 Encounter for screening for malignant neoplasm of colon: Secondary | ICD-10-CM | POA: Diagnosis not present

## 2022-10-06 DIAGNOSIS — E039 Hypothyroidism, unspecified: Secondary | ICD-10-CM | POA: Diagnosis not present

## 2022-10-11 DIAGNOSIS — F902 Attention-deficit hyperactivity disorder, combined type: Secondary | ICD-10-CM | POA: Diagnosis not present

## 2022-10-11 DIAGNOSIS — F411 Generalized anxiety disorder: Secondary | ICD-10-CM | POA: Diagnosis not present

## 2022-10-25 DIAGNOSIS — F411 Generalized anxiety disorder: Secondary | ICD-10-CM | POA: Diagnosis not present

## 2022-10-25 DIAGNOSIS — F902 Attention-deficit hyperactivity disorder, combined type: Secondary | ICD-10-CM | POA: Diagnosis not present

## 2022-11-02 DIAGNOSIS — E039 Hypothyroidism, unspecified: Secondary | ICD-10-CM | POA: Diagnosis not present

## 2022-11-02 DIAGNOSIS — R7301 Impaired fasting glucose: Secondary | ICD-10-CM | POA: Diagnosis not present

## 2022-11-08 DIAGNOSIS — F411 Generalized anxiety disorder: Secondary | ICD-10-CM | POA: Diagnosis not present

## 2022-11-08 DIAGNOSIS — F902 Attention-deficit hyperactivity disorder, combined type: Secondary | ICD-10-CM | POA: Diagnosis not present

## 2022-11-11 ENCOUNTER — Ambulatory Visit: Payer: BC Managed Care – PPO

## 2022-11-14 ENCOUNTER — Ambulatory Visit
Admission: RE | Admit: 2022-11-14 | Discharge: 2022-11-14 | Disposition: A | Discharge status: Home / Self Care | Payer: BC Managed Care – PPO | Source: Ambulatory Visit | Attending: Internal Medicine | Admitting: Internal Medicine

## 2022-11-14 ENCOUNTER — Encounter: Payer: Self-pay | Admitting: Neurology

## 2022-11-14 ENCOUNTER — Other Ambulatory Visit: Payer: Self-pay | Admitting: Neurology

## 2022-11-14 ENCOUNTER — Ambulatory Visit (INDEPENDENT_AMBULATORY_CARE_PROVIDER_SITE_OTHER): Payer: BC Managed Care – PPO | Admitting: Neurology

## 2022-11-14 VITALS — BP 132/94 | HR 77 | Ht 60.5 in | Wt 252.0 lb

## 2022-11-14 VITALS — BP 119/89 | HR 100 | Temp 98.4°F | Resp 16

## 2022-11-14 DIAGNOSIS — Z6841 Body Mass Index (BMI) 40.0 and over, adult: Secondary | ICD-10-CM | POA: Diagnosis not present

## 2022-11-14 DIAGNOSIS — J069 Acute upper respiratory infection, unspecified: Secondary | ICD-10-CM

## 2022-11-14 DIAGNOSIS — R768 Other specified abnormal immunological findings in serum: Secondary | ICD-10-CM | POA: Diagnosis not present

## 2022-11-14 DIAGNOSIS — G4733 Obstructive sleep apnea (adult) (pediatric): Secondary | ICD-10-CM

## 2022-11-14 DIAGNOSIS — G2581 Restless legs syndrome: Secondary | ICD-10-CM

## 2022-11-14 DIAGNOSIS — G9332 Myalgic encephalomyelitis/chronic fatigue syndrome: Secondary | ICD-10-CM

## 2022-11-14 DIAGNOSIS — R7303 Prediabetes: Secondary | ICD-10-CM

## 2022-11-14 LAB — POCT RAPID STREP A (OFFICE): Rapid Strep A Screen: NEGATIVE

## 2022-11-14 MED ORDER — BENZONATATE 100 MG PO CAPS: 100.0000 mg | ORAL_CAPSULE | Freq: Three times a day (TID) | ORAL | 0 refills | Status: DC

## 2022-11-14 NOTE — Progress Notes (Signed)
SLEEP MEDICINE CLINIC    Provider:  Larey Seat, MD  Primary Care Physician:  Mckinley Jewel, MD [address] [place] [id number]     Referring Provider: Mckinley Jewel, Md [address] [place],   [id number]          Chief Complaint according to patient   Patient presents with:     New Patient (Initial Visit)     Here to re-establish care and Pt states originally dg with OSA 2014 here. Set up with CPAP through [facility]. Used CPAP until had weight loss and repeated SS 2019 stating no longer needed CPAP. Since then she has gained weight again and feels apnea is back. (She restarted her CPAP using old supplies and has noticed huge improvement) due to not having insurance coverage for supplies she is unable to keep using it without starting over. She is eligible for new machine.      HISTORY OF PRESENT ILLNESS:  11-14-2022:  Meagan [name] is a 47 y.o. female patient who is seen upon referral on 11/14/2022 from Dr. Doristine Bosworth, MD, her pcp. She is here for a recurrence of OSA and sleepiness, in the setting of regaining all her weight she lost 2018-19 on a KETO diet.  Chief concern according to patient : I lost weight and could quit CPAP. I now am back or above the weight of 2017-18 , my BMI is 48.41.  I now feel again sleepy, I snore again, I am fatigued, I hurt all the time. My thyroid is not functioning, and I am prediabetic.  I have the pleasure of seeing Meagan [name] again, a new patient  after a hiatus of 5-6 years, on 11/14/22 a left-handed Caucasian and married  female with a possible sleep disorder.    The patient had the first sleep study in the year 2002 with Dr. 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 This time a PSG  protocol, followed by CPAP titration .  a result of an AHI ( Apnea Hypopnea index)  of o/h.    Sleep relevant medical history: Enuresis times one-Tonsillectomy and adenoid- at age 9 ,deviated  septum    Family medical /sleep history: mother had CPAP with OSA, brother on CPAP.  Social history:  Patient is working as Forensic scientist and lives in a household with 2 persons/ husband and daughter , 34.  The patient currently works daytime,  Tobacco use- none .  ETOH use: none ,  Caffeine intake in form of Coffee( 2 cups in AM ) .    Sleep habits are as follows: The patient's dinner time is between 6-7 PM. The patient goes to bed at 10.306-8 PM and continues to sleep for 6-8 hours when using CPAP.  The preferred sleep position is prone , with the support of 1 pillow.  Dreams are reportedly frequent/vivid.   The patient wakes up spontaneously with sunrise  She reports not feeling refreshed or restored in AM without CPAP- , with symptoms such as dry mouth, morning headaches, and residual fatigue.  Naps are taken infrequently, lasting from 60 to 160 minutes and are less refreshing than nocturnal sleep.    She did not bring her CPAP -  her CPAP was officially discontinued.   HISTORY: 21 May 2018 visit with Meagan [name], a  47 year old Caucasian right-handed female patient who has been  diagnosed with  sleep disorders since 2002.  Her original work-up  was for narcolepsy/ excessive daytime sleepiness with Dr. Baird Lyons.  The patient transferred to [facility] in 2013-2014 and was here  diagnosed with obstructive sleep apnea.  HLA test for narcolepsy  earlier was negative, but she did have some persistent  hypersomnia until she changed her sleep habits significantly.   Since then she has no longer needed Adderall.  She has become a  highly compliant CPAP user - today shows 87% compliance.  CPAP is  set at 9 cmH2O pressure with 2 cm EPR, the average daily usage  time is 5 hours 17 minutes, the residual AHI is 0.8.  She has  minimal air leaks. The machine is now due to be replaced.  She  endorsed the fatigue severity score at 14 points, the Epworth  sleepiness score at 4  out of 24 possible points.  I would like for the patient to change to an auto titration  device. I have the previous sleep study available.  BMI: 38.28   STUDY RESULTS:  Total Recording Time: 9 hours 59 minutes; Valid  test time 9 hand 54 min  Total Apnea/Hypopnea Index (AHI): all hypopneas at 2.7 /h; RDI:   5.7 /h;     HISTORY: Meagan BLUMENSCHEIN is a 47 y.o. female here as a referral from Dr. Sabra Heck for evaluation of narcolepsy , diagnosed in 2002 (?) by Dr. Annamaria Boots.  The patient reports that she that he remembers her sleep study, but cannot recall the date . She took initially Provigil but had no response. Tried Ritalin and also was no positive response. She started on Adderall, which helped significantly as her daytime sleepiness problem but over the years had needed an increase in doors. She is now taking 40 mg of Adderall XR in form of 20 mg capsules daily. She has run into problems with her insurance not wanting to cover the stools, considered exceedingly high for daytime use. The patient also is on sertraline the generic form of Zoloft 25 mg 2 a day and birth control pill.   The patient's paperwork apparently shows that she has been prescribed Adderall for the treatment of attention deficit disorder, but she get he tells me that she never carried that diagnosis. He endorses today the were sleepiness score at 17 points. It is on 20 mg Adderall daily, since this is the only medication form but is currently covered by her insurance.   Able discussed today of the need to perform annual sleep test followed by an and as LT to confirm the diagnosis and ulcers in the medications that have emerged over the last decade and the treatment of narcolepsy. The patient may have an additional sleep disorder to her not morbid obesity. She gained over 100 pounds she believes since been tested 12 years ago.   I informed her that she will need to wean off her Zoloft medication before and MS LT test can be valid she is  willing to do without and feels that this is a stable time in her life. Review of Systems: Out of a complete 14 system review, the patient complains of only the following symptoms, and all other reviewed systems are negative. Epworth 17 , FSS at 50 points, weight gain, snoring,  The patient has a history of sleep paralysis, vivid  Dream,  intrusion of dreams, sleep hallucinations. Narcolepsy has to be reevaluated in this patient.     Review of Systems: Out of a complete  14 system review, the patient complains of only the following symptoms, and all other reviewed systems are negative.:    RLS positive!  Anxiety.  Fatigue, sleepiness , snoring, fragmented sleep when she not used CPAP- she restarted  6 months ago.   How likely are you to doze in the following situations: 0 = not likely, 1 = slight chance, 2 = moderate chance, 3 = high chance   Sitting and Reading? Watching Television? Sitting inactive in a public place (theater or meeting)? As a passenger in a car for an hour without a break? Lying down in the afternoon when circumstances permit? Sitting and talking to someone? Sitting quietly after lunch without alcohol? In a car, while stopped for a few minutes in traffic?   Total = 9/ 24 points   FSS endorsed at 60/ 63 points.    Without CPAP EDS 12/ 24 FSS at 60/63   Social History   Socioeconomic History   Marital status: Married    Spouse name: Not on file   Number of children: 1   Years of education: some col.   Highest education level: Not on file  Occupational History    Employer: [employer]    Comment: [employer]  Tobacco Use   Smoking status: Former    Types: Cigarettes    Start date: 09/05/2001   Smokeless tobacco: Never   Tobacco comments:    10 years ago  Vaping Use   Vaping Use: Never used  Substance and Sexual Activity   Alcohol use: Yes    Comment: occasionally,one 3-7 oz of wine per week   Drug use: No   Sexual activity: Not  Currently    Birth control/protection: Pill    Comment: kariva  Other Topics Concern   Not on file  Social History Narrative   Patient is single and lives at home, her daughter lives with her.   Patient has one child.   Patient is working full-time.   Patient has some college education.   Patient is left handed.   Patient does not drink any caffeine.   Social Determinants of Radio broadcast assistant Strain: Not on file  Food Insecurity: Not on file  Transportation Needs: Not on file  Physical Activity: Not on file  Stress: Not on file  Social Connections: Not on file    Family History  Problem Relation Age of Onset   Alcohol abuse Mother    Depression Mother    Anxiety disorder Mother    Sleep apnea Mother    Alcoholism Mother    Diabetes Mother    Alcohol abuse Father    Alcoholism Father    Alcohol abuse Brother    Alcoholism Brother    Multiple sclerosis Maternal Grandmother    Breast cancer Paternal Grandmother    Healthy Daughter     Past Medical History:  Diagnosis Date   Acquired hypothyroidism 03/02/2018   Allergy    Seasonal   Anxiety    Complication of anesthesia    post -op nausea and vomiting at age 89   Constipation, chronic    Depression    Family history of anesthesia complication    cousin woke-up during procedure   GERD (gastroesophageal reflux disease)    Narcolepsy    Ruptured lumbar disc    L5   Sleep apnea    wears CPAP nightly   Sleep paralysis    at times   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 Meanwhile  Medications have become ineffective and patient gained 100 pounds, developed OSA, now on CPAP 9 cm, MSLT to follow in 04-19-13.    Vivid dream     Past Surgical History:  Procedure Laterality Date   CHOLECYSTECTOMY N/A 01/01/2014   Procedure: LAPAROSCOPIC CHOLECYSTECTOMY WITH INTRAOPERATIVE CHOLANGIOGRAM;  Surgeon: Imogene Burn. Tsuei, MD;  Location: [facility];  Service: General;  Laterality: N/A;    TONSILLECTOMY AND ADENOIDECTOMY  1981   age 43   VAGINAL DELIVERY       Current Outpatient Medications on File Prior to Visit  Medication Sig Dispense Refill   FLUoxetine (PROZAC) 10 MG tablet TAKE 1/2 TABLET BY MOUTH DAILY 45 tablet 2   fluticasone (FLONASE) 50 MCG/ACT nasal spray Place 1-2 sprays into both nostrils daily as needed for allergies.     levothyroxine (SYNTHROID) 50 MCG tablet Take 1 tablet (50 mcg total) by mouth daily. 90 tablet 1   meloxicam (MOBIC) 7.5 MG tablet TAKE 1 TABLET BY MOUTH EVERY DAY 90 tablet 1   montelukast (SINGULAIR) 10 MG tablet TAKE 1 TABLET BY MOUTH EVERYDAY AT BEDTIME 30 tablet 11   Multiple Vitamins-Minerals (MULTIVITAMIN ADULT PO) multivitamin     ondansetron (ZOFRAN-ODT) 4 MG disintegrating tablet Take 4 mg by mouth every 8 (eight) hours as needed.     No current facility-administered medications on file prior to visit.    Allergies  Allergen Reactions   Amoxicillin Itching and Rash     DIAGNOSTIC DATA (LABS, IMAGING, TESTING) - I reviewed patient records, labs, notes, testing and imaging myself where available.  ANA Titer 1 titer 1:40 High   Comment: A low level ANA titer may be present in pre-clinical autoimmune diseases and normal individuals.                 Reference Range                 <1:40        Negative                 1:40-1:80    Low Antibody Level                 >1:80        Elevated Antibody Level .  ANA Pattern 1  Mitotic, Spindle Fibers Abnormal   Comment: The spindle fibers between the poles are stained in mitotic cells, associated with cone-shaped decoration of the mitotic poles. The pattern is rare in Sjogren's syndrome, systemic lupus erythematosus (SLE), and other connective tissue diseases. . AC-25: Spindle Fibers . International Consensus on ANA Patterns (https://www.hernandez-brewer.com/)  Resulting Agency  [facility]         Specimen Collected: 02/17/21 12:57 Last Resulted:  02/19/21 19:53      Lab Flowsheet      Order Details      View Encounter      Lab and Collection Details      Routing      Result History    View All Conversations on this Encounter        Result Care Coordination   Result Notes   Wendie Agreste, MD 02/23/2021  2:50 PM EDT Back to Top    Results sent by MyChart     Patient Communication   Append Comments   Seen Back to Top    Ms. Bonk,   Rheumatoid test and thyroid test were both normal.  Blood counts were normal.  The inflammation test and ANA or possible autoimmune screening tests were both elevated.  Gout test was mildly elevated.  ...  Written by Wendie Agreste, MD on 02/23/2021  2:50 PM EDT View Full Comments Seen by patient Adron Bene on 02/23/2021  3:03 PM    Other Results from 02/17/2021   Contains abnormal data Sedimentation rate Order: [id number] Status: Final result      Visible to patient: Yes (seen)      Next appt: Today at 12:00 PM in Urgent Care ([facility])      Dx: Polyarthralgia           Component Ref Range & Units 1 yr ago 4 yr ago  Sed Rate 0 - 20 mm/hr 38 High  14 R  Resulting Agency  [facility]               Lab Results  Component Value Date   WBC 8.7 02/17/2021   HGB 12.8 02/17/2021   HCT 38.3 02/17/2021   MCV 80.0 02/17/2021   PLT 384.0 02/17/2021      Component Value Date/Time   NA 138 02/17/2021 1257   NA 138 02/24/2018 1428   K 4.4 02/17/2021 1257   CL 103 02/17/2021 1257   CO2 25 02/17/2021 1257   GLUCOSE 81 02/17/2021 1257   BUN 14 02/17/2021 1257   BUN 12 02/24/2018 1428   CREATININE 0.93 02/17/2021 1257   CALCIUM 9.6 02/17/2021 1257   PROT 7.1 02/17/2021 1257   PROT 6.5 02/24/2018 1428   ALBUMIN 4.4 02/17/2021 1257   ALBUMIN 4.2 02/24/2018 1428   AST 38 (H) 02/17/2021 1257   ALT 51 (H) 02/17/2021 1257   ALKPHOS 70 02/17/2021 1257   BILITOT 0.5 02/17/2021 1257   BILITOT <0.2 02/24/2018 1428   GFRNONAA 77 02/24/2018  1428   GFRAA 88 02/24/2018 1428   Lab Results  Component Value Date   CHOL 208 (H) 02/24/2018   HDL 88 02/24/2018   LDLCALC 108 (H) 02/24/2018   TRIG 62 02/24/2018   CHOLHDL 2.4 02/24/2018   Lab Results  Component Value Date   HGBA1C 5.4 02/24/2018   Lab Results  Component Value Date   VITAMINB12 820 01/12/2017   Lab Results  Component Value Date   TSH 1.56 02/17/2021    PHYSICAL EXAM:  Today's Vitals   11/14/22 0901  BP: (!) 132/94  Pulse: 77  Weight: 252 lb (114.3 kg)  Height: 5' 0.5" (1.537 m)   Body mass index is 48.41 kg/m.   Wt Readings from Last 3 Encounters:  11/14/22 252 lb (114.3 kg)  04/07/21 241 lb (109.3 kg)  03/22/21 241 lb (109.3 kg)     Ht Readings from Last 3 Encounters:  11/14/22 5' 0.5" (1.537 m)  04/07/21 '5\' 1"'$  (1.549 m)  03/22/21 5' (1.524 m)      General: The patient is awake, alert and appears not in acute distress. The patient is well groomed. Head: Normocephalic, atraumatic. Neck is supple. Mallampati 3 plus ,  neck circumference:16.5  inches .  Nasal airflow  patent.  Retrognathia is not seen.  Dental status: biological  Cardiovascular:  Regular rate and cardiac rhythm by pulse,  without distended neck veins. Respiratory: Lungs are clear to auscultation.  Skin:  Without evidence of ankle edema, or rash. Trunk: The patient's posture is erect.   NEUROLOGIC EXAM: The patient is awake and alert, oriented to place and time.   Memory subjective described as intact.  Attention span & concentration ability appears normal.  Speech is fluent,  without  dysarthria, dysphonia or aphasia.  Mood and affect are appropriate.   Cranial nerves: no loss of smell or taste reported  Pupils are equal and briskly reactive to light. Funduscopic exam deferred. .  Extraocular movements in vertical and horizontal planes were intact and without nystagmus. No Diplopia. Visual fields by finger perimetry are intact. Hearing was intact to soft voice .     Facial sensation intact to fine touch.  Facial motor strength is symmetric and tongue and uvula move midline.  Neck ROM : rotation, tilt and flexion extension were normal for age and shoulder shrug was symmetrical.    Motor exam:  Symmetric bulk, tone and ROM.   Normal tone without cog wheeling, symmetric grip strength .   Sensory:  Fine touch, pinprick and vibration were tested  and  normal.  Proprioception tested in the upper extremities was normal.   Coordination: Rapid alternating movements in the fingers/hands were of normal speed.  The Finger-to-nose maneuver was intact without evidence of ataxia, dysmetria or tremor.   Gait and station: Patient could rise unassisted from a seated position, walked without assistive device.  Toe and heel walk were deferred.  Deep tendon reflexes: in the  upper and lower extremities are symmetric and intact.  Babinski response was deferred.    ASSESSMENT AND PLAN 47 y.o. year old female  here with:    1) Recurrent weight gain has let to new OSA symptoms and she reactivated her old CPAP - from 2014 which is due to be replaced this year anyway.   2) excessive daytime sleepiness and morning headaches, dry mouth have improved by restarting CPAP.   3) RLS -has not been checked for ferritin, TIBC. Takes magnesium.   4) fatigue related to general malaise, chronic ache and pain. Autoimmune ? Endocrine? Rheumatological panel was supposingly negative,   5) BMI is again over 40, this time 48. Has been seen in weight loss clinic and ws placed on 900 kcal diet at the time ( 2018). [place].  I recommended to try Dr Leafy Ro or Dr Juleen China.  I would like for the patient to undergo a new home sleep test here with our lab, I do not think that we need to read titrate her to CPAP as she had the experience before twice actually.  If she likes to continue with a nasal interface, nasal mask or nasal pillow walk radio she is welcome she may have to combine it with a  chinstrap to help reduce oral air leak.  I encourage all my patients to not sleep in supine position and a fullface mask often interferes with that goal.  She will likely be placed on a new auto titration CPAP after her home sleep test is interpreted.  Her next visit can be with the nurse practitioner. Labs order TIBC, Ferritin.     I plan to follow up either personally or through our NP within 3-5 months.   I would like to thank Pahwani, Michell Heinrich, MD , [facility] Pahwani, [place], Md [address] [place],  [place] [address] for allowing me to meet with and to take care of this pleasant patient.    After spending a total time of  55  minutes face to face and additional time for physical and neurologic examination, review of laboratory studies,  personal review of imaging studies, reports and results of other testing and  review of referral information / records as far as provided in visit,   Electronically signed by: Larey Seat, MD 11/14/2022 9:10 AM  [facility] Board certified by The [employer] of Sleep Medicine and Diplomate of the [employer] of Sleep Medicine. Board certified In Neurology through the [place], Fellow of the [employer] of Neurology. Medical Director of [facility].

## 2022-11-14 NOTE — ED Provider Notes (Signed)
[facility]    CSN: [id number] Arrival date & time: 11/14/22  1129      History   Chief Complaint Chief Complaint  Patient presents with   Sore Throat    Flu symptoms for 7 days, sinus symptoms , fever, and now sore throat. - Entered by patient   Cough    HPI TAHESHA RATTAN is a 47 y.o. female.   Patient presents to urgent care for evaluation of cough, nasal congestion, sore throat, and intermittent frontal headache that started 1 week ago on Monday November 07, 2022. She states she has a history of frequent pneumonia infections and takes Singulair daily to help prevent this. Cough is mostly dry and nonproductive. Most bothersome symptom currently is sore throat. States she has a history of strep throat and would like to be checked for this today. She is able to keep down foods and fluids well without nausea, vomiting, diarrhea, constipation, or abdominal pain. Highest fever at home initially 101.8, states this has responded well to tylenol and ibuprofen at home. She has not had any recent antipyretics and is currently afebrile.  Works around [employer], dispensing, denies other known sick contacts. Has not performed COVID-19 test at home, no recent COVID-19 infections in the last 90 days to her knowledge. Sore throat currently 5 on a scale of 0-10 and worsened by swallowing. No chest pain, shortness of breath, or heart palpitations reported. She denies history of chronic respiratory problems. Non-smoker, denies drug use.    Sore Throat  Cough   Past Medical History:  Diagnosis Date   Acquired hypothyroidism 03/02/2018   Allergy    Seasonal   Anxiety    Complication of anesthesia    post -op nausea and vomiting at age 42   Constipation, chronic    Depression    Family history of anesthesia complication    cousin woke-up during procedure   GERD (gastroesophageal reflux disease)    Narcolepsy    Ruptured lumbar disc    L5   Sleep apnea    wears CPAP nightly   Sleep paralysis    at  times   Uncontrolled narcolepsy 04/03/2013   Diagnosed 2002 , by Dr Baird Lyons. Meanwhile  Medications have become ineffective and patient gained 100 pounds, developed OSA, now on CPAP 9 cm, MSLT to follow in 04-19-13.    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 Georgette Dover, MD;  Location: [facility];  Service: General;  Laterality: N/A;   TONSILLECTOMY AND ADENOIDECTOMY  1981   age 20   VAGINAL DELIVERY      OB History     Gravida  1   Para  1   Term  1   Preterm      AB      Living  1      SAB      IAB      Ectopic      Multiple  Live Births               Home Medications    Prior to Admission medications   Medication Sig Start Date End Date Taking? Authorizing Provider  FLUoxetine (PROZAC) 10 MG tablet TAKE 1/2 TABLET BY MOUTH DAILY 04/29/22   Wendie Agreste, MD  fluticasone [facility]) 50 MCG/ACT nasal spray Place 1-2 sprays into both nostrils daily as needed for allergies.    [provider]  levothyroxine (SYNTHROID) 50 MCG tablet Take 1 tablet (50 mcg total) by mouth daily. 08/23/21   Wendie Agreste, MD  meloxicam (MOBIC) 7.5 MG tablet TAKE 1 TABLET BY MOUTH EVERY DAY 01/21/22   Wendie Agreste, MD  montelukast (SINGULAIR) 10 MG tablet TAKE 1 TABLET BY MOUTH EVERYDAY AT BEDTIME 06/01/21   Wendie Agreste, MD  Multiple Vitamins-Minerals (MULTIVITAMIN ADULT PO) multivitamin    [provider]  ondansetron (ZOFRAN-ODT) 4 MG disintegrating tablet Take 4 mg by mouth every 8 (eight) hours as needed.    [provider]    Family History Family History  Problem Relation Age of Onset   Alcohol abuse Mother    Depression Mother    Anxiety disorder Mother    Sleep apnea Mother    Alcoholism Mother    Diabetes Mother    Alcohol abuse Father    Alcoholism Father    Alcohol abuse Brother    Alcoholism Brother    Multiple sclerosis Maternal Grandmother    Breast cancer Paternal Grandmother    Healthy Daughter     Social History Social History   Tobacco Use   Smoking status: Former    Types: Cigarettes    Start date: 09/05/2001   Smokeless tobacco: Never   Tobacco comments:    10 years ago  Vaping Use   Vaping Use: Never used  Substance Use Topics   Alcohol use: Yes    Comment: occasionally,one 3-7 oz of wine per week   Drug use: No     Allergies   Amoxicillin   Review of Systems Review of Systems  Respiratory:  Positive for cough.   Per HPI   Physical Exam Triage Vital Signs ED Triage Vitals [11/14/22 1202]  Enc Vitals Group     BP 119/89     Pulse Rate 100     Resp 16     Temp 98.4 F (36.9 C)     Temp Source Oral     SpO2 97 %     Weight      Height      Head Circumference      Peak Flow      Pain Score 5     Pain Loc      Pain Edu?      Excl. in [place]?    No data found.  Updated Vital Signs BP 119/89 (BP Location: Left Arm)   Pulse 100   Temp 98.4 F (36.9 C) (Oral)   Resp 16   SpO2 97%   Visual Acuity Right Eye Distance:   Left Eye Distance:   Bilateral Distance:    Right Eye Near:   Left Eye Near:    Bilateral Near:     Physical Exam Vitals and nursing note reviewed.  Constitutional:      Appearance: She is not ill-appearing or toxic-appearing.  HENT:     Head: Normocephalic and atraumatic.     Right Ear: Hearing, tympanic membrane, ear canal and external ear normal.  Left Ear: Hearing, tympanic  membrane, ear canal and external ear normal.     Nose: Congestion present.     Mouth/Throat:     Lips: Pink.     Mouth: Mucous membranes are moist. Mucous membranes are pale. No oral lesions.     Pharynx: Posterior oropharyngeal erythema present. No pharyngeal swelling, oropharyngeal exudate or uvula swelling.     Tonsils: No tonsillar exudate or tonsillar abscesses. 0 on the right. 0 on the left.  Eyes:     General: Lids are normal. Vision grossly intact. Gaze aligned appropriately.     Extraocular Movements: Extraocular movements intact.     Conjunctiva/sclera: Conjunctivae normal.  Cardiovascular:     Rate and Rhythm: Normal rate and regular rhythm.     Heart sounds: Normal heart sounds, S1 normal and S2 normal.  Pulmonary:     Effort: Pulmonary effort is normal. No respiratory distress.     Breath sounds: Normal breath sounds and air entry. No wheezing, rhonchi or rales.  Musculoskeletal:     Cervical back: Neck supple.  Lymphadenopathy:     Cervical: No cervical adenopathy.  Skin:    General: Skin is warm and dry.     Capillary Refill: Capillary refill takes less than 2 seconds.     Findings: No rash.  Neurological:     General: No focal deficit present.     Mental Status: She is alert and oriented to person, place, and time. Mental status is at baseline.     Cranial Nerves: No dysarthria or facial asymmetry.  Psychiatric:        Mood and Affect: Mood normal.        Speech: Speech normal.        Behavior: Behavior normal.        Thought Content: Thought content normal.        Judgment: Judgment normal.      UC Treatments / Results  Labs (all labs ordered are listed, but only abnormal results are displayed) Labs Reviewed  POCT RAPID STREP A (OFFICE)    EKG   Radiology No results found.  Procedures Procedures (including critical care time)  Medications Ordered in UC Medications - No data to display  Initial Impression / Assessment and Plan / UC Course  I  have reviewed the triage vital signs and the nursing notes.  Pertinent labs & imaging results that were available during my care of the patient were reviewed by me and considered in my medical decision making (see chart for details).   1. Viral URI with cough Symptoms and physical exam consistent with a viral upper respiratory tract infection that will likely resolve with rest, fluids, and prescriptions for symptomatic relief. Deferred imaging based on stable cardiopulmonary exam and hemodynamically stable vital signs.  Group A strep testing is negative and neck.  Throat culture is pending. Deferred viral testing as patient is out of the quarantine window, although suspect she may have had either COVID-19 or influenza. She is no longer febrile. May return to work in the next couple of days, does not need work note.  Tessalon perles sent to pharmacy for symptomatic relief to be taken as prescribed.  May continue taking over the counter medications as directed for further symptomatic relief. Nonpharmacologic interventions for symptom relief provided and after visit summary below. Advised to push fluids to stay well hydrated while recovering from viral illness.   Discussed physical exam and available lab work findings in clinic with patient.  Counseled  patient regarding appropriate use of medications and potential side effects for all medications recommended or prescribed today. Discussed red flag signs and symptoms of worsening condition,when to call the PCP office, return to urgent care, and when to seek higher level of care in the emergency department. Patient verbalizes understanding and agreement with plan. All questions answered. Patient discharged in stable condition.    Final Clinical Impressions(s) / UC Diagnoses   Final diagnoses:  Viral URI with cough     Discharge Instructions      You have a viral upper respiratory infection.  Strep testing negative.  Use the following medicines  to help with symptoms: - Plain Mucinex (guaifenesin) over the counter as directed every 12 hours to thin mucous so that you are able to get it out of your body easier. Drink plenty of water while taking this medication so that it works well in your body (at least 8 cups a day).  - Tylenol 1,'000mg'$  and/or ibuprofen '600mg'$  every 6 hours with food as needed for aches/pains or fever/chills.  - Tessalon perles every 8 hours as needed for cough.  1 tablespoon of honey in warm water and/or salt water gargles may also help with symptoms. Humidifier to your room will help add water to the air and reduce coughing.  If you develop any new or worsening symptoms, please return.  If your symptoms are severe, please go to the emergency room.  Follow-up with your primary care provider for further evaluation and management of your symptoms as well as ongoing wellness visits.  I hope you feel better!   ED Prescriptions   None    PDMP not reviewed this encounter.   Talbot Grumbling, [place] 11/14/22 1242

## 2022-11-14 NOTE — Discharge Instructions (Addendum)
You have a viral upper respiratory infection.  Strep testing negative.  Use the following medicines to help with symptoms: - Plain Mucinex (guaifenesin) over the counter as directed every 12 hours to thin mucous so that you are able to get it out of your body easier. Drink plenty of water while taking this medication so that it works well in your body (at least 8 cups a day).  - Tylenol 1,'000mg'$  and/or ibuprofen '600mg'$  every 6 hours with food as needed for aches/pains or fever/chills.  - Tessalon perles every 8 hours as needed for cough.  1 tablespoon of honey in warm water and/or salt water gargles may also help with symptoms. Humidifier to your room will help add water to the air and reduce coughing.  If you develop any new or worsening symptoms, please return.  If your symptoms are severe, please go to the emergency room.  Follow-up with your primary care provider for further evaluation and management of your symptoms as well as ongoing wellness visits.  I hope you feel better!

## 2022-11-14 NOTE — ED Triage Notes (Signed)
Pt c/o fever, body aches, "sinus stuff," cough, fever   Onset ~ wed

## 2022-11-15 LAB — IRON,TIBC AND FERRITIN PANEL
Ferritin: 235 ng/mL — ABNORMAL HIGH (ref 15–150)
Iron Saturation: 27 % (ref 15–55)
Iron: 77 ug/dL (ref 27–159)
Total Iron Binding Capacity: 287 ug/dL (ref 250–450)
UIBC: 210 ug/dL (ref 131–425)

## 2022-11-15 NOTE — Progress Notes (Signed)
Ferritin is actually elevated , iron saturation in low-normal range. Ferritin can be elevated in the setting of inflammation. These results are very different from 4 years ago, when the patient's Iron sat was much lower and ferritin was low as well ( in RLS ferritin should be 50 or higher ) . No iron supplement or iv recommended.

## 2022-11-18 ENCOUNTER — Ambulatory Visit: Payer: BC Managed Care – PPO | Admitting: Medical

## 2022-11-18 ENCOUNTER — Encounter: Payer: Self-pay | Admitting: Medical

## 2022-11-18 VITALS — BP 110/70 | HR 96 | Ht 61.75 in | Wt 250.2 lb

## 2022-11-18 DIAGNOSIS — E790 Hyperuricemia without signs of inflammatory arthritis and tophaceous disease: Secondary | ICD-10-CM | POA: Diagnosis not present

## 2022-11-18 DIAGNOSIS — Z8669 Personal history of other diseases of the nervous system and sense organs: Secondary | ICD-10-CM | POA: Diagnosis not present

## 2022-11-18 DIAGNOSIS — G894 Chronic pain syndrome: Secondary | ICD-10-CM

## 2022-11-18 DIAGNOSIS — M2550 Pain in unspecified joint: Secondary | ICD-10-CM

## 2022-11-18 DIAGNOSIS — E039 Hypothyroidism, unspecified: Secondary | ICD-10-CM | POA: Diagnosis not present

## 2022-11-18 NOTE — Progress Notes (Signed)
Subjective:  Meagan [name] is a 47 y.o. female who presents for Chief Complaint  Patient presents with   new pt    New pt get established. Inflammation all over body- very painful, was told to lose weight but has already lose weight and it didn't help. Family history.      Here as a new patient.    Was seeing PCP on [place] prior, and had been doctor hopping since then.   She notes hx/o serious metabolic issues.  She notes that she was told over and over to lose weight then address issues.   She notes problems with inflammation ongoing, has used meloxicam once daily in the past that seemed to help.  But was told by other doctor to not be on this long term.   Was going to [facility] for a while, and did great with exercise, weight loss, but was on Meloxicam while doing this.   Last doctor wouldn't refill meloxicam  Gets a lot of joint pains, lots of pain going upstairs.  Lies in bed in night and hurts.   Pains in feet, knees, hips.   Has seen rheumatology prior and was advised she didn't have autoimmune disease.   Saw Dr. Vernelle Emerald.  Has done physical therapy.   Was advised at one point to see weight loss clinic, and couldn't do this regarding cost   Has OSA, had been on CPAP until she lost weight a few years ago.  Has new sleep study soon  Has abnormal thyroid labs, been seeing doctor at [facility] for [facility].  When going to [employer] has gotten to 189 lb  No personal history of kidney stones or gout.   When she was off sugar for 3 years going to [facility], was doing keto diet, lost weight but still had pain.  No joint swelling but does get morning stiffness.  Morning stiffness loses up within an hour.  Mom was diabetic.    Has been on lyrica and gabapentin prior.  Currently not exercising, in too much pain.     No other aggravating or relieving factors.    No other c/o.  Past Medical History:  Diagnosis Date   Acquired hypothyroidism 03/02/2018    Allergy    Seasonal   Anxiety    Complication of anesthesia    post -op nausea and vomiting at age 70   Constipation, chronic    Depression    Family history of anesthesia complication    cousin woke-up during procedure   GERD (gastroesophageal reflux disease)    Narcolepsy    Ruptured lumbar disc    L5   Sleep apnea    wears CPAP nightly   Sleep paralysis    at times   Uncontrolled narcolepsy 04/03/2013   Diagnosed 2002 , by Dr Baird Lyons. Meanwhile  Medications have become ineffective and patient gained 100 pounds, developed OSA, now on CPAP 9 cm, MSLT to follow in 04-19-13.    Vivid dream    Current Outpatient Medications on File Prior to Visit  Medication Sig Dispense Refill   FLUoxetine (PROZAC) 10 MG tablet TAKE 1/2 TABLET BY MOUTH DAILY 45 tablet 2   fluticasone (FLONASE) 50 MCG/ACT nasal spray Place 1-2 sprays into both nostrils daily as needed for allergies.     levothyroxine (SYNTHROID) 50 MCG tablet Take 1 tablet (50 mcg total) by mouth daily. 90 tablet 1   meloxicam (MOBIC) 7.5 MG tablet TAKE 1 TABLET BY MOUTH EVERY  DAY 90 tablet 1   montelukast (SINGULAIR) 10 MG tablet TAKE 1 TABLET BY MOUTH EVERYDAY AT BEDTIME 30 tablet 11   Multiple Vitamins-Minerals (MULTIVITAMIN ADULT PO) multivitamin     ondansetron (ZOFRAN-ODT) 4 MG disintegrating tablet Take 4 mg by mouth every 8 (eight) hours as needed. (Patient not taking: Reported on 11/18/2022)     No current facility-administered medications on file prior to visit.    The following portions of the patient's history were reviewed and updated as appropriate: allergies, current medications, past family history, past medical history, past social history, past surgical history and problem list.  ROS Otherwise as in subjective above    Objective: BP 110/70   Pulse 96   Ht 5' 1.75" (1.568 m)   Wt 250 lb 3.2 oz (113.5 kg)   BMI 46.13 kg/m   Wt Readings from Last 3 Encounters:  11/18/22 250 lb 3.2 oz (113.5 kg)   11/14/22 252 lb (114.3 kg)  04/07/21 241 lb (109.3 kg)   General appearance: alert, no distress, well developed, well nourished Heart: RRR, normal S1, S2, no murmurs Lungs: CTA bilaterally, no wheezes, rhonchi, or rales MSK: tender bilat hips, trochanter bursa and IT band, mild pain with bilat hip ROM which seems mostly full, knees nontender without obvious swelling or laxity, mild puffiness between right hand 2nd and 3rd MCP, otherwise UE and LE unremarkable Pulses: 2+ radial pulses, 2+ pedal pulses, normal cap refill Ext: no edema     Assessment: Encounter Diagnoses  Name Primary?   Chronic pain syndrome Yes   Polyarthralgia    Hypothyroidism, unspecified type    History of sleep apnea    Elevated uric acid in blood      Plan: We discussed symptoms and concerns  I reviewed back over her 2022 rheumatology consult notes several labs in the chart record in 2022 and 2023.  Also reviewed some recent labs she had through [place] [facility]  11/02/22 lab from [place] 5.9% Recent TSH 17.46 CMET normal except for glucose 106  We discussed possible causes of pain arthralgias  Currently she is having her thyroid medicines addressed through Dr. [facility].  She is also getting a repeat sleep study.  She was on CPAP a few years ago but lost a lot of weight and came off the CPAP  Of note her uric acid was elevated a year ago.  We discussed that this may still be elevated and be treated  Advise a low sugar diet such as Whole 30 program.  Consider taking fish oil supplement daily, consider glucosamine/chondroitin supplement daily, consider adding turmeric in the diet  For now continue meloxicam  Try to start exercising regularly such as lap swimming or pool aerobics, walking or using something like elliptical or stationary bike  Stretch daily    Meagan [name] was seen today for new pt.  Diagnoses and all orders for this visit:  Chronic pain syndrome -     Uric  acid -     CYCLIC CITRUL PEPTIDE ANTIBODY, IGG/IGA -     Sedimentation rate  Polyarthralgia -     Uric acid -     CYCLIC CITRUL PEPTIDE ANTIBODY, IGG/IGA -     Sedimentation rate  Hypothyroidism, unspecified type  History of sleep apnea  Elevated uric acid in blood -     Uric acid -     CYCLIC CITRUL PEPTIDE ANTIBODY, IGG/IGA -     Sedimentation rate    Follow up: pending labs

## 2022-11-18 NOTE — Patient Instructions (Signed)
We discussed symptoms and concerns  I reviewed back over her 2022 rheumatology consult notes several labs in the chart record in 2022 and 2023.  Also reviewed some recent labs she had through [place]  11/02/22 lab from [place] 5.9% Recent TSH 17.46 CMET normal except for glucose 106  We discussed possible causes of pain arthralgias  Currently she is having her thyroid medicines addressed through Dr. [facility].  She is also getting a repeat sleep study.  She was on CPAP a few years ago but lost a lot of weight and came off the CPAP  Of note her uric acid was elevated a year ago.  We discussed that this may still be elevated and be treated  Advise a low sugar diet such as Whole 30 program.  Consider taking fish oil supplement daily, consider glucosamine/chondroitin supplement daily, consider adding turmeric in the diet  For now continue meloxicam  Try to start exercising regularly such as lap swimming or pool aerobics, walking or using something like elliptical or stationary bike  Stretch daily

## 2022-11-21 LAB — CYCLIC CITRUL PEPTIDE ANTIBODY, IGG/IGA: Cyclic Citrullin Peptide Ab: 6 units (ref 0–19)

## 2022-11-21 LAB — SEDIMENTATION RATE: Sed Rate: 25 mm/hr (ref 0–32)

## 2022-11-21 LAB — URIC ACID: Uric Acid: 6.6 mg/dL — ABNORMAL HIGH (ref 2.6–6.2)

## 2022-11-22 DIAGNOSIS — F902 Attention-deficit hyperactivity disorder, combined type: Secondary | ICD-10-CM | POA: Diagnosis not present

## 2022-11-22 DIAGNOSIS — F411 Generalized anxiety disorder: Secondary | ICD-10-CM | POA: Diagnosis not present

## 2022-11-22 NOTE — Progress Notes (Signed)
Results sent through MyChart

## 2022-11-25 ENCOUNTER — Encounter: Payer: Self-pay | Admitting: Neurology

## 2022-11-30 ENCOUNTER — Telehealth: Payer: Self-pay | Admitting: Neurology

## 2022-11-30 NOTE — Telephone Encounter (Signed)
MAIL OUT HST- [employer] no auth req spoke to Lia Foyer ref # [id number]   Patient is on the schedule for 12/07/22.

## 2022-12-06 DIAGNOSIS — F902 Attention-deficit hyperactivity disorder, combined type: Secondary | ICD-10-CM | POA: Diagnosis not present

## 2022-12-06 DIAGNOSIS — F411 Generalized anxiety disorder: Secondary | ICD-10-CM | POA: Diagnosis not present

## 2022-12-07 ENCOUNTER — Ambulatory Visit: Payer: BC Managed Care – PPO | Admitting: Neurology

## 2022-12-07 DIAGNOSIS — G4710 Hypersomnia, unspecified: Secondary | ICD-10-CM

## 2022-12-07 DIAGNOSIS — G4733 Obstructive sleep apnea (adult) (pediatric): Secondary | ICD-10-CM

## 2022-12-07 DIAGNOSIS — G9332 Myalgic encephalomyelitis/chronic fatigue syndrome: Secondary | ICD-10-CM

## 2022-12-07 DIAGNOSIS — R768 Other specified abnormal immunological findings in serum: Secondary | ICD-10-CM

## 2022-12-12 NOTE — Progress Notes (Signed)
[facility] SLEEP TEST REPORT ( by Watch PAT)   STUDY DATA- mail -in device :  12-11-2022 DOB:  Oct 23, 1975 MRN: [id number]     ORDERING CLINICIAN: Melvyn Novas, MD  REFERRING CLINICIAN: Ardean Larsen, MD   CLINICAL INFORMATION/HISTORY:New patient referral by Ardean Larsen, MD, 11-14-2022.   Here to re-establish care , originally dx with OSA in 2014 at [facility] and set- up with CPAP through [facility]. Used CPAP until she achieved weight loss and a repeated Sleep study in 2019 found no longer need for apnea treatment.  Since then ,she has gained weight back again and apnea is back. Quote :" I lost weight and could quit CPAP. I now am back or above the weight of 2017-18 , my BMI is 48.41.  I now feel again sleepy, I snore again, I am fatigued, I hurt all the time. My thyroid is not functioning, and I am prediabetic".  (She restarted her CPAP using old supplies and has noticed a huge improvement) . Due to not having insurance coverage for supplies, she is currently unable to keep using it without starting over : needs sleep testing and CPAP prescription.      Epworth sleepiness score: 9 /24- as she restarted CPAP- was 17/ 24 before (!) . FSS at 60/63 points   BMI:  48.4 kg/m   Neck Circumference: 16.5"   Sleep Summary:   Total Recording Time (hours, min):   8 h 36 m     Total Sleep Time (hours, min):   7 h and 34 m              Percent REM (%):      27.8%                                  Respiratory Indices:   Calculated pAHI (per hour):  39.9/h                           REM pAHI:   42.2/h                                              NREM pAHI: 39/h                             Positional AHI: supine AHI was 45/h and non-supine was 35.6/h.   No central events     Snoring reached a mean volume of 51 dB                                               Oxygen Saturation Statistics:           O2 Saturation Range (%):  between 86% at  nadir and max sat at 99% with a mean of 93%                                     O2 Saturation (minutes) <89%:  0.2 m        Pulse Rate Statistics:   Pulse Mean (bpm):   73 bpm              Pulse Range:   51- 116 bpm              IMPRESSION:  This HST confirms the presence of severe Obstructive Sleep Apnea without REM sleep dependency and fairly independent of sleep position. The need to continue positive airway pressure therapy is clearly documented.    RECOMMENDATION: Continue auto titration CPAP therapy , and a new ResMed CPAP device is needed. Setting between 5-18 cm water, with 3 cm EPR and heated humidification. The patient clearly is snoring loudly, and the PAP interface needs to consider this. Should she opt of a nasal cradle or Eson mask, please add chinstrap.     INTERPRETING PHYSICIAN:   Melvyn Novas, MD   Medical Director of [facility] at [facility].

## 2022-12-16 NOTE — Procedures (Signed)
       [facility]    HOME SLEEP TEST REPORT ( by Watch PAT)   STUDY DATA- mail -in device :  12-11-2022 DOB:  09/06/1975 MRN: [id number]     ORDERING CLINICIAN: Brinlee Gambrell, MD  REFERRING CLINICIAN: Rinka Pahwani, MD   CLINICAL INFORMATION/HISTORY:New patient referral by Rinka Pahwani, MD, 11-14-2022.   Here to re-establish care , originally dx with OSA in 2014 at [facility] and set- up with CPAP through [facility]. Used CPAP until she achieved weight loss and a repeated Sleep study in 2019 found no longer need for apnea treatment.  Since then ,she has gained weight back again and apnea is back. Quote :" I lost weight and could quit CPAP. I now am back or above the weight of 2017-18 , my BMI is 48.41.  I now feel again sleepy, I snore again, I am fatigued, I hurt all the time. My thyroid is not functioning, and I am prediabetic".  (She restarted her CPAP using old supplies and has noticed a huge improvement) . Due to not having insurance coverage for supplies, she is currently unable to keep using it without starting over : needs sleep testing and CPAP prescription.      Epworth sleepiness score: 9 /24- as she restarted CPAP- was 17/ 24 before (!) . FSS at 60/63 points   BMI:  48.4 kg/m   Neck Circumference: 16.5"   Sleep Summary:   Total Recording Time (hours, min):   8 h 36 m     Total Sleep Time (hours, min):   7 h and 34 m              Percent REM (%):      27.8%                                  Respiratory Indices:   Calculated pAHI (per hour):  39.9/h                           REM pAHI:   42.2/h                                              NREM pAHI: 39/h                             Positional AHI: supine AHI was 45/h and non-supine was 35.6/h.   No central events     Snoring reached a mean volume of 51 dB                                               Oxygen Saturation Statistics:           O2 Saturation Range (%):  between 86% at  nadir and max sat at 99% with a mean of 93%                                     O2 Saturation (minutes) <89%:     0.2 m        Pulse Rate Statistics:   Pulse Mean (bpm):   73 bpm              Pulse Range:   51- 116 bpm              IMPRESSION:  This HST confirms the presence of severe Obstructive Sleep Apnea without REM sleep dependency and fairly independent of sleep position. The need to continue positive airway pressure therapy is clearly documented.    RECOMMENDATION: Continue auto titration CPAP therapy , and a new ResMed CPAP device is needed. Setting between 5-18 cm water, with 3 cm EPR and heated humidification. The patient clearly is snoring loudly, and the PAP interface needs to consider this. Should she opt of a nasal cradle or Eson mask, please add chinstrap.     INTERPRETING PHYSICIAN:   Melvyn Novas, MD   Medical Director of [facility].

## 2022-12-16 NOTE — Addendum Note (Signed)
Addended by: Melvyn Novas on: 12/16/2022 12:15 PM   Modules accepted: Orders

## 2022-12-20 DIAGNOSIS — F411 Generalized anxiety disorder: Secondary | ICD-10-CM | POA: Diagnosis not present

## 2022-12-20 DIAGNOSIS — F902 Attention-deficit hyperactivity disorder, combined type: Secondary | ICD-10-CM | POA: Diagnosis not present

## 2022-12-26 DIAGNOSIS — G4733 Obstructive sleep apnea (adult) (pediatric): Secondary | ICD-10-CM | POA: Diagnosis not present

## 2022-12-27 DIAGNOSIS — G4733 Obstructive sleep apnea (adult) (pediatric): Secondary | ICD-10-CM | POA: Diagnosis not present

## 2023-01-03 DIAGNOSIS — F902 Attention-deficit hyperactivity disorder, combined type: Secondary | ICD-10-CM | POA: Diagnosis not present

## 2023-01-03 DIAGNOSIS — F411 Generalized anxiety disorder: Secondary | ICD-10-CM | POA: Diagnosis not present

## 2023-01-17 DIAGNOSIS — F902 Attention-deficit hyperactivity disorder, combined type: Secondary | ICD-10-CM | POA: Diagnosis not present

## 2023-01-17 DIAGNOSIS — F411 Generalized anxiety disorder: Secondary | ICD-10-CM | POA: Diagnosis not present

## 2023-01-25 DIAGNOSIS — G4733 Obstructive sleep apnea (adult) (pediatric): Secondary | ICD-10-CM | POA: Diagnosis not present

## 2023-01-31 DIAGNOSIS — F411 Generalized anxiety disorder: Secondary | ICD-10-CM | POA: Diagnosis not present

## 2023-01-31 DIAGNOSIS — F902 Attention-deficit hyperactivity disorder, combined type: Secondary | ICD-10-CM | POA: Diagnosis not present

## 2023-02-21 DIAGNOSIS — F411 Generalized anxiety disorder: Secondary | ICD-10-CM | POA: Diagnosis not present

## 2023-02-21 DIAGNOSIS — F902 Attention-deficit hyperactivity disorder, combined type: Secondary | ICD-10-CM | POA: Diagnosis not present

## 2023-02-25 DIAGNOSIS — G4733 Obstructive sleep apnea (adult) (pediatric): Secondary | ICD-10-CM | POA: Diagnosis not present

## 2023-03-07 DIAGNOSIS — F411 Generalized anxiety disorder: Secondary | ICD-10-CM | POA: Diagnosis not present

## 2023-03-07 DIAGNOSIS — F902 Attention-deficit hyperactivity disorder, combined type: Secondary | ICD-10-CM | POA: Diagnosis not present

## 2023-03-14 DIAGNOSIS — E039 Hypothyroidism, unspecified: Secondary | ICD-10-CM | POA: Diagnosis not present

## 2023-03-21 ENCOUNTER — Encounter: Payer: Self-pay | Admitting: Neurology

## 2023-03-21 ENCOUNTER — Ambulatory Visit: Payer: BC Managed Care – PPO | Admitting: Neurology

## 2023-03-21 VITALS — BP 126/74 | HR 72 | Ht 61.0 in | Wt 257.0 lb

## 2023-03-21 DIAGNOSIS — G4733 Obstructive sleep apnea (adult) (pediatric): Secondary | ICD-10-CM | POA: Diagnosis not present

## 2023-03-21 DIAGNOSIS — Z6841 Body Mass Index (BMI) 40.0 and over, adult: Secondary | ICD-10-CM | POA: Diagnosis not present

## 2023-03-21 NOTE — Progress Notes (Signed)
Provider:  Melvyn Novas, MD  Primary Care Physician:  Jac Canavan, PA-C [address] [place] [address]     Referring Provider: Jac Canavan, Pa-c [address] [place],  [place] [address]          Chief Complaint according to patient   Patient presents with:     New Patient (Initial Visit)           HISTORY OF PRESENT ILLNESS:  Meagan [name] is a 47 y.o. female patient who is here for revisit 03/21/2023 for  her first visit n her new CPAP , but she was not told to bring her CPAP (!). .  Chief concern according to patient :  I have condensation ater in my nose in AM". The patient underwent a home sleep test which showed severe obstructive sleep apnea with an AHI of 39.9 so to ease the compensated comparison she has severe apnea with an AHI of 40 it did not make a big difference if she slept on her back or on her side, it did not make a big difference if it was a REM sleep or non-REM sleep apnea, she did have minimal oxygen desaturation, her pulse range was in normal range with a mean heart rate of 73 bpm, the overall sleep time recorded was 7-1/2 hours and is valid as such.  Before she started CPAP her Epworth score was 17 out of 24 and her fatigue severity score was 60 out of 63 points.  She now endorsed the Epworth Sleepiness Scale at 3 out of 24 points and the fatigue severity score still at 60 out of 63.  She likes how quiet the CPAP machine is now and she does like her mask so our question today is just to reduce the level of humidity to make it easier to wake up without condensation water in the face.  Her chronic fatigue syndrome is a separate diagnosis from her sleep disorders and is not affected by the treatment of snoring and apnea.  She has been 100% compliant user of CPAP by days and 87% by hours with an average of 6 hours 26 minutes.  Her minimum pressure is set at 5 and her maximum at 18 cmH2O with 3 cm expiratory relief.  The residual AHI is  1.1/h which is an excellent resolution of apnea.  95th percentile air leak is very low at 5.8 L a minute this speaks for good mask fit.  Her pressure at the 95th percentile is 15 cmH2O and is well within the current settings.  No central apneas are arising.  No changes except to the humidity have to be made.  I can't change the setting for humidity from remote.      11-14-2022:  Meagan [name] is a 47 y.o. female patient who is seen upon referral on 11/14/2022 from Dr. Jacqulyn Bath, MD, her pcp. She is here for a recurrence of OSA and sleepiness, in the setting of regaining all her weight she lost 2018-19 on a KETO diet.  Chief concern according to patient : I lost weight and could quit CPAP. I now am back or above the weight of 2017-18 , my BMI is 48.41.  I now feel again sleepy, I snore again, I am fatigued, I hurt all the time. My thyroid is not functioning, and I am prediabetic.  I have the pleasure of seeing Meagan [name] again, a new patient  after a  hiatus of 5-6 years, on 11/14/22 a left-handed Caucasian and married  female with a possible sleep disorder.    The patient had the first sleep study in the year 2002 with Dr. Jetty Duhamel,  and in 2004 for narcolepsy- and then in 2014 at [facility]. This time a PSG  protocol, followed by CPAP titration .  a result of an AHI ( Apnea Hypopnea index)  of o/h.    Sleep relevant medical history: Enuresis times one-Tonsillectomy and adenoid- at age 49 ,deviated septum    Family medical /sleep history: mother had CPAP with OSA, brother on CPAP.  Social history:  Patient is working as Marketing executive and lives in a household with 2 persons/ husband and daughter , 55.  The patient currently works daytime,  Tobacco use- none .  ETOH use: none ,  Caffeine intake in form of Coffee( 2 cups in AM ) .     Sleep habits are as follows: The patient's dinner time is between 6-7 PM. The patient goes to bed at 10.306-8 PM and continues to sleep for 6-8 hours when  using CPAP.  The preferred sleep position is prone , with the support of 1 pillow.  Dreams are reportedly frequent/vivid.   The patient wakes up spontaneously with sunrise  She reports not feeling refreshed or restored in AM without CPAP- , with symptoms such as dry mouth, morning headaches, and residual fatigue.  Naps are taken infrequently, lasting from 60 to 160 minutes and are less refreshing than nocturnal sleep.      She did not bring her CPAP -  her CPAP was officially discontinued.    HISTORY: 21 May 2018 visit with Meagan [name], a  47 year old Caucasian right-handed female patient who has been  diagnosed with sleep disorders since 2002.  Her original work-up  was for narcolepsy/ excessive daytime sleepiness with Dr. Jetty Duhamel.  The patient transferred to [facility] in 2013-2014 and was here  diagnosed with obstructive sleep apnea.  HLA test for narcolepsy  earlier was negative, but she did have some persistent  hypersomnia until she changed her sleep habits significantly.   Since then she has no longer needed Adderall.  She has become a  highly compliant CPAP user - today shows 87% compliance.  CPAP is  set at 9 cmH2O pressure with 2 cm EPR, the average daily usage  time is 5 hours 17 minutes, the residual AHI is 0.8.  She has  minimal air leaks. The machine is now due to be replaced.  She  endorsed the fatigue severity score at 14 points, the Epworth  sleepiness score at 4 out of 24 possible points.  I would like for the patient to change to an auto titration  device. I have the previous sleep study available.  BMI: 38.28      Review of Systems: Out of a complete 14 system review, the patient complains of only the following symptoms, and all other reviewed systems are negative.:  Fatigue, sleepiness , snoring controlled  Nocturia rarely now-    In the process of weight loss.      How likely are you to doze in the following situations: 0 = not likely, 1 =  slight chance, 2 = moderate chance, 3 = high chance   Sitting and Reading? Watching Television? Sitting inactive in a public place (theater or meeting)? As a passenger in a car for an hour without a break? Lying down in the afternoon  when circumstances permit? Sitting and talking to someone? Sitting quietly after lunch without alcohol? In a car, while stopped for a few minutes in traffic?   Total = 3/ 24 points   FSS endorsed at 60/ 63 points.   Social History   Socioeconomic History   Marital status: Married    Spouse name: Not on file   Number of children: 1   Years of education: some col.   Highest education level: Not on file  Occupational History    Employer: [employer]    Comment: [employer]  Tobacco Use   Smoking status: Former    Types: Cigarettes    Start date: 09/05/2001   Smokeless tobacco: Never   Tobacco comments:    10 years ago  Vaping Use   Vaping status: Never Used  Substance and Sexual Activity   Alcohol use: Yes    Comment: occasionally,one 3-7 oz of wine per week   Drug use: No   Sexual activity: Not Currently    Birth control/protection: Pill    Comment: kariva  Other Topics Concern   Not on file  Social History Narrative   Patient is single and lives at home, her daughter lives with her.   Patient has one child.   Patient is working full-time.   Patient has some college education.   Patient is left handed.   Patient does not drink any caffeine.   Social Determinants of Corporate investment banker Strain: Not on file  Food Insecurity: Not on file  Transportation Needs: Not on file  Physical Activity: Not on file  Stress: Not on file  Social Connections: Not on file    Family History  Problem Relation Age of Onset   Alcohol abuse Mother    Depression Mother    Anxiety disorder Mother    Sleep apnea Mother    Alcoholism Mother    Diabetes Mother    Alcohol abuse Father    Alcoholism Father    Alcohol abuse  Brother    Alcoholism Brother    Multiple sclerosis Maternal Grandmother    Breast cancer Paternal Grandmother    Healthy Daughter     Past Medical History:  Diagnosis Date   Acquired hypothyroidism 03/02/2018   Allergy    Seasonal   Anxiety    Complication of anesthesia    post -op nausea and vomiting at age 40   Constipation, chronic    Depression    Family history of anesthesia complication    cousin woke-up during procedure   GERD (gastroesophageal reflux disease)    Narcolepsy    Ruptured lumbar disc    L5   Sleep apnea    wears CPAP nightly   Sleep paralysis    at times   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 Meanwhile  Medications have become ineffective and patient gained 100 pounds, developed OSA, now on CPAP 9 cm, MSLT to follow in 04-19-13.    Vivid dream     Past Surgical History:  Procedure Laterality Date   CHOLECYSTECTOMY N/A 01/01/2014   Procedure: LAPAROSCOPIC CHOLECYSTECTOMY WITH INTRAOPERATIVE CHOLANGIOGRAM;  Surgeon: Wilmon Arms. Corliss Skains, MD;  Location: [facility] OR;  Service: General;  Laterality: N/A;   TONSILLECTOMY AND ADENOIDECTOMY  1981   age 26   VAGINAL DELIVERY       Current Outpatient Medications on File Prior to Visit  Medication Sig Dispense Refill   FLUoxetine (PROZAC) 10 MG tablet  TAKE 1/2 TABLET BY MOUTH DAILY 45 tablet 2   fluticasone (FLONASE) 50 MCG/ACT nasal spray Place 1-2 sprays into both nostrils daily as needed for allergies.     levothyroxine (SYNTHROID) 75 MCG tablet Take 75 mcg by mouth daily before breakfast.     meloxicam (MOBIC) 7.5 MG tablet TAKE 1 TABLET BY MOUTH EVERY DAY 90 tablet 1   montelukast (SINGULAIR) 10 MG tablet TAKE 1 TABLET BY MOUTH EVERYDAY AT BEDTIME 30 tablet 11   Multiple Vitamins-Minerals (MULTIVITAMIN ADULT PO) multivitamin     ondansetron (ZOFRAN-ODT) 4 MG disintegrating tablet Take 4 mg by mouth every 8 (eight) hours as needed. (Patient not taking: Reported on 11/18/2022)      No current facility-administered medications on file prior to visit.    Allergies  Allergen Reactions   Cymbalta [Duloxetine Hcl]     intolerance   Amoxicillin Itching and Rash     DIAGNOSTIC DATA (LABS, IMAGING, TESTING) - I reviewed patient records, labs, notes, testing and imaging myself where available.  Lab Results  Component Value Date   WBC 8.7 02/17/2021   HGB 12.8 02/17/2021   HCT 38.3 02/17/2021   MCV 80.0 02/17/2021   PLT 384.0 02/17/2021      Component Value Date/Time   NA 138 02/17/2021 1257   NA 138 02/24/2018 1428   K 4.4 02/17/2021 1257   CL 103 02/17/2021 1257   CO2 25 02/17/2021 1257   GLUCOSE 81 02/17/2021 1257   BUN 14 02/17/2021 1257   BUN 12 02/24/2018 1428   CREATININE 0.93 02/17/2021 1257   CALCIUM 9.6 02/17/2021 1257   PROT 7.1 02/17/2021 1257   PROT 6.5 02/24/2018 1428   ALBUMIN 4.4 02/17/2021 1257   ALBUMIN 4.2 02/24/2018 1428   AST 38 (H) 02/17/2021 1257   ALT 51 (H) 02/17/2021 1257   ALKPHOS 70 02/17/2021 1257   BILITOT 0.5 02/17/2021 1257   BILITOT <0.2 02/24/2018 1428   GFRNONAA 77 02/24/2018 1428   GFRAA 88 02/24/2018 1428   Lab Results  Component Value Date   CHOL 208 (H) 02/24/2018   HDL 88 02/24/2018   LDLCALC 108 (H) 02/24/2018   TRIG 62 02/24/2018   CHOLHDL 2.4 02/24/2018   Lab Results  Component Value Date   HGBA1C 5.4 02/24/2018   Lab Results  Component Value Date   VITAMINB12 820 01/12/2017   Lab Results  Component Value Date   TSH 1.56 02/17/2021    PHYSICAL EXAM:  Today's Vitals   03/21/23 0929  BP: 126/74  Pulse: 72  Weight: 257 lb (116.6 kg)  Height: 5\' 1"  (1.549 m)   Body mass index is 48.56 kg/m.   Wt Readings from Last 3 Encounters:  03/21/23 257 lb (116.6 kg)  11/18/22 250 lb 3.2 oz (113.5 kg)  11/14/22 252 lb (114.3 kg)     Ht Readings from Last 3 Encounters:  03/21/23 5\' 1"  (1.549 m)  11/18/22 5' 1.75" (1.568 m)  11/14/22 5' 0.5" (1.537 m)      General: The patient is awake,  alert and appears not in acute distress. The patient is well groomed. Head: Well developed, in no acute distress  Neck: circumference 17 inches, Mallampati 3    Neurological examination  Mentation: Alert oriented to time, place, history taking. Follows all commands speech and language fluent Cranial nerve II-XII: Pupils were equal round reactive to light. Extraocular movements were full, visual field were full on confrontational test. Facial sensation and strength were normal.  Uvula tongue  midline. Head turning and shoulder shrug  were normal and symmetric.T Motor: The motor testing reveals 5 over 5 strength of all 4 extremities. Good symmetric motor tone is noted throughout.  Sensory: Sensory testing is intact to soft touch on all 4 extremities. No evidence of extinction is noted.  Coordination: Cerebellar testing reveals good finger-nose-finger and heel-to-shin bilaterally.  Gait and station: Gait is normal.  Reflexes: Deep tendon reflexes are symmetric and normal bilaterally.      ASSESSMENT AND PLAN 47 y.o. year old female  here with:    1)  Obesity as main risk factor for OSA. Referral to Caren Beasley,MD. She is not having DM but she has nausea, and eats very little meat.  Needs protein sources.    2) Reduce the CPAP's heating level to one score below current setting, if still have condensation water , reduce further.   3) Rv in 12 months , you are doing well with CPAP,  your severe apnea is well controlled as is your snoring.  I plan to follow up either personally or through our NP within 12 months.   I would like to thank Tysinger, Kermit Balo, PA-C and Tysinger, Kermit Balo, Pa-c [address] [place],  [place] [address] for allowing me to meet with and to take care of this pleasant patient.   After spending a total time of  23  minutes face to face and additional time for physical and neurologic examination, review of laboratory studies,  personal review of imaging studies, reports  and results of other testing and review of referral information / records as far as provided in visit,   Electronically signed by: Melvyn Novas, MD 03/21/2023 10:04 AM  [facility] Board certified by The [employer] of Sleep Medicine and Diplomate of the [employer] of Sleep Medicine. Board certified In Neurology through the ABPN, Fellow of the [employer] of Neurology.

## 2023-03-21 NOTE — Patient Instructions (Signed)
47 y.o. year old female  here with:     1)  Obesity as main risk factor for OSA. Referral to Caren Beasley,MD. She is not having DM but she has nausea, and eats very little meat.  Needs protein sources.      2) Reduce the CPAP's heating level to one score below current setting, if still have condensation water , reduce further.    3) Rv in 12 months , you are doing well with CPAP,  your severe apnea is well controlled as is your snoring.   I plan to follow up either personally or through our NP within 12 months.    I would like to thank Tysinger, Kermit Balo, PA-C and Tysinger, Kermit Balo, Pa-c [address] [place],  [place] [address] for allowing me to meet with and to take care of this pleasant patient.    After spending a total time of  23  minutes face to face and additional time for physical and neurologic examination, review of laboratory studies,  personal review of imaging studies, reports and results of other testing and review of referral information / records as far as provided in visit,    Electronically signed by: Melvyn Novas, MD 03/21/2023 10:04 AM

## 2023-03-22 ENCOUNTER — Telehealth: Payer: Self-pay

## 2023-03-22 NOTE — Telephone Encounter (Signed)
 NA

## 2023-03-23 DIAGNOSIS — F411 Generalized anxiety disorder: Secondary | ICD-10-CM | POA: Diagnosis not present

## 2023-03-23 DIAGNOSIS — F902 Attention-deficit hyperactivity disorder, combined type: Secondary | ICD-10-CM | POA: Diagnosis not present

## 2023-03-24 ENCOUNTER — Encounter: Payer: Self-pay | Admitting: Neurology

## 2023-03-27 DIAGNOSIS — G4733 Obstructive sleep apnea (adult) (pediatric): Secondary | ICD-10-CM | POA: Diagnosis not present

## 2023-04-07 DIAGNOSIS — F411 Generalized anxiety disorder: Secondary | ICD-10-CM | POA: Diagnosis not present

## 2023-04-07 DIAGNOSIS — F902 Attention-deficit hyperactivity disorder, combined type: Secondary | ICD-10-CM | POA: Diagnosis not present

## 2023-04-10 DIAGNOSIS — Z0289 Encounter for other administrative examinations: Secondary | ICD-10-CM

## 2023-04-17 ENCOUNTER — Encounter (INDEPENDENT_AMBULATORY_CARE_PROVIDER_SITE_OTHER): Payer: Self-pay | Admitting: Family Medicine

## 2023-04-17 ENCOUNTER — Ambulatory Visit (INDEPENDENT_AMBULATORY_CARE_PROVIDER_SITE_OTHER): Payer: BC Managed Care – PPO | Admitting: Family Medicine

## 2023-04-17 VITALS — BP 138/84 | HR 107 | Temp 98.1°F | Ht 61.0 in | Wt 258.0 lb

## 2023-04-17 DIAGNOSIS — G4733 Obstructive sleep apnea (adult) (pediatric): Secondary | ICD-10-CM | POA: Diagnosis not present

## 2023-04-17 DIAGNOSIS — Z6841 Body Mass Index (BMI) 40.0 and over, adult: Secondary | ICD-10-CM

## 2023-04-17 DIAGNOSIS — E669 Obesity, unspecified: Secondary | ICD-10-CM

## 2023-04-18 DIAGNOSIS — F902 Attention-deficit hyperactivity disorder, combined type: Secondary | ICD-10-CM | POA: Diagnosis not present

## 2023-04-18 DIAGNOSIS — F411 Generalized anxiety disorder: Secondary | ICD-10-CM | POA: Diagnosis not present

## 2023-04-18 NOTE — Progress Notes (Signed)
Office: [phone]  /  Fax: [phone]   Initial Visit  Meagan [name] was seen in clinic today to evaluate for obesity. She is interested in losing weight to improve overall health and reduce the risk of weight related complications. She presents today to review program treatment options, initial physical assessment, and evaluation.     She was referred by: Specialist (Dr. Vickey Huger)  When asked what else they would like to accomplish? She states: Adopt healthier eating patterns, Improve existing medical conditions, and Improve quality of life  When asked how has your weight affected you? She states: Contributed to medical problems and Contributed to orthopedic problems or mobility issues  Some associated conditions: Arthritis, OSA, and Prediabetes  Weight promoting medications identified: None  Current nutrition plan: None and Other: has lost weight with Weight Watchers and Keto in the past  Current level of physical activity: None  Current or previous pharmacotherapy: None   Past medical history includes:   Past Medical History:  Diagnosis Date   Acquired hypothyroidism 03/02/2018   Allergy    Seasonal   Anxiety    Complication of anesthesia    post -op nausea and vomiting at age 47   Constipation, chronic    Depression    Family history of anesthesia complication    cousin woke-up during procedure   GERD (gastroesophageal reflux disease)    Narcolepsy    Ruptured lumbar disc    L5   Sleep apnea    wears CPAP nightly   Sleep paralysis    at times   Uncontrolled narcolepsy 04/03/2013   Diagnosed 2002 , by Dr Jetty Duhamel. Meanwhile  Medications have become ineffective and patient gained 100 pounds, developed OSA, now on CPAP 9 cm, MSLT to follow in 04-19-13.    Vivid dream      Objective:   BP 138/84   Pulse (!) 107   Temp 98.1 F (36.7 C)   Ht 5\' 1"  (1.549 m)   Wt 258 lb (117 kg)   SpO2 96%   BMI 48.75 kg/m  She was weighed on the bioimpedance scale:  Body mass index is 48.75 kg/m.  Peak Weight: 258 lbs ,Visceral Fat Rating:18, Body Fat%:51.7.  General:  Alert, oriented and cooperative. Patient is in no acute distress.  Respiratory: Normal respiratory effort, no problems with respiration noted  Extremities: Normal range of motion.    Mental Status: Normal mood and affect. Normal behavior. Normal judgment and thought content.   Assessment and Plan:  1. OSA (obstructive sleep apnea) Patient is using CPAP nightly, and she notes obstructive sleep apnea had improved with weight loss previously.  Plan: Patient's goal is to control obstructive sleep apnea with diet, exercise, and weight loss.  We will schedule her for a workup visit and eating evaluation.  2. BMI 45.0-49.9, adult (HCC)  3. Obesity, Beginning BMI 48.8 We reviewed weight, biometrics, associated medical conditions and contributing factors with patient. She would benefit from weight loss therapy via a modified calorie, low-carb, high-protein nutritional plan tailored to their REE (resting energy expenditure) which will be determined by indirect calorimetry.  We will also assess for cardiometabolic risk and nutritional derangements via fasting serologies at her next appointment.      Obesity Treatment / Action Plan:  Will complete provided nutritional and psychosocial assessment questionnaire before the next appointment. Will be scheduled for indirect calorimetry to determine resting energy expenditure in a fasting state.  This will allow [place] to create a reduced calorie, high-protein  meal plan to promote loss of fat mass while preserving muscle mass.  Obesity Education Performed Today:  She was weighed on the bioimpedance scale and results were discussed and documented in the synopsis.  We discussed obesity as a disease and the importance of a more detailed evaluation of all the factors contributing to the disease.  We discussed the importance of long term lifestyle changes  which include nutrition, exercise and behavioral modifications as well as the importance of customizing this to her specific health and social needs.  We discussed the benefits of reaching a healthier weight to alleviate the symptoms of existing conditions and reduce the risks of the biomechanical, metabolic and psychological effects of obesity.  [place] appears to be in the action stage of change and states they are ready to start intensive lifestyle modifications and behavioral modifications.  35 minutes was spent today on this visit including the above counseling, pre-visit chart review, and post-visit documentation.  Reviewed by clinician on day of visit: allergies, medications, problem list, medical history, surgical history, family history, social history, and previous encounter notes.   I, Burt Knack, am acting as transcriptionist for Quillian Quince, MD   I have reviewed the above documentation for accuracy and completeness, and I agree with the above. Quillian Quince, MD

## 2023-04-27 DIAGNOSIS — G4733 Obstructive sleep apnea (adult) (pediatric): Secondary | ICD-10-CM | POA: Diagnosis not present

## 2023-05-01 ENCOUNTER — Ambulatory Visit (INDEPENDENT_AMBULATORY_CARE_PROVIDER_SITE_OTHER): Payer: BC Managed Care – PPO | Admitting: Family Medicine

## 2023-05-01 ENCOUNTER — Encounter (INDEPENDENT_AMBULATORY_CARE_PROVIDER_SITE_OTHER): Payer: Self-pay | Admitting: Family Medicine

## 2023-05-01 VITALS — BP 118/83 | HR 75 | Temp 98.5°F | Ht 61.0 in | Wt 258.0 lb

## 2023-05-01 DIAGNOSIS — Z1331 Encounter for screening for depression: Secondary | ICD-10-CM

## 2023-05-01 DIAGNOSIS — F32A Depression, unspecified: Secondary | ICD-10-CM

## 2023-05-01 DIAGNOSIS — R87810 Cervical high risk human papillomavirus (HPV) DNA test positive: Secondary | ICD-10-CM | POA: Diagnosis not present

## 2023-05-01 DIAGNOSIS — R739 Hyperglycemia, unspecified: Secondary | ICD-10-CM | POA: Insufficient documentation

## 2023-05-01 DIAGNOSIS — R5383 Other fatigue: Secondary | ICD-10-CM | POA: Diagnosis not present

## 2023-05-01 DIAGNOSIS — F3289 Other specified depressive episodes: Secondary | ICD-10-CM

## 2023-05-01 DIAGNOSIS — Z01419 Encounter for gynecological examination (general) (routine) without abnormal findings: Secondary | ICD-10-CM | POA: Diagnosis not present

## 2023-05-01 DIAGNOSIS — E039 Hypothyroidism, unspecified: Secondary | ICD-10-CM | POA: Diagnosis not present

## 2023-05-01 DIAGNOSIS — R0602 Shortness of breath: Secondary | ICD-10-CM | POA: Diagnosis not present

## 2023-05-01 DIAGNOSIS — Z139 Encounter for screening, unspecified: Secondary | ICD-10-CM | POA: Diagnosis not present

## 2023-05-01 DIAGNOSIS — Z1231 Encounter for screening mammogram for malignant neoplasm of breast: Secondary | ICD-10-CM | POA: Diagnosis not present

## 2023-05-01 DIAGNOSIS — E669 Obesity, unspecified: Secondary | ICD-10-CM | POA: Diagnosis not present

## 2023-05-01 DIAGNOSIS — Z6841 Body Mass Index (BMI) 40.0 and over, adult: Secondary | ICD-10-CM

## 2023-05-02 LAB — CBC WITH DIFFERENTIAL/PLATELET
Basophils Absolute: 0 10*3/uL (ref 0.0–0.2)
Basos: 0 %
EOS (ABSOLUTE): 0.4 10*3/uL (ref 0.0–0.4)
Eos: 4 %
Hematocrit: 37.9 % (ref 34.0–46.6)
Hemoglobin: 12.4 g/dL (ref 11.1–15.9)
Immature Grans (Abs): 0 10*3/uL (ref 0.0–0.1)
Immature Granulocytes: 0 %
Lymphocytes Absolute: 3.1 10*3/uL (ref 0.7–3.1)
Lymphs: 34 %
MCH: 26.4 pg — ABNORMAL LOW (ref 26.6–33.0)
MCHC: 32.7 g/dL (ref 31.5–35.7)
MCV: 81 fL (ref 79–97)
Monocytes Absolute: 0.7 10*3/uL (ref 0.1–0.9)
Monocytes: 7 %
Neutrophils Absolute: 4.8 10*3/uL (ref 1.4–7.0)
Neutrophils: 55 %
Platelets: 412 10*3/uL (ref 150–450)
RBC: 4.69 x10E6/uL (ref 3.77–5.28)
RDW: 13.6 % (ref 11.7–15.4)
WBC: 8.9 10*3/uL (ref 3.4–10.8)

## 2023-05-02 LAB — CMP14+EGFR
ALT: 22 IU/L (ref 0–32)
AST: 19 IU/L (ref 0–40)
Albumin: 4.4 g/dL (ref 3.9–4.9)
Alkaline Phosphatase: 84 IU/L (ref 44–121)
BUN/Creatinine Ratio: 21 (ref 9–23)
BUN: 18 mg/dL (ref 6–24)
Bilirubin Total: 0.4 mg/dL (ref 0.0–1.2)
CO2: 24 mmol/L (ref 20–29)
Calcium: 9.6 mg/dL (ref 8.7–10.2)
Chloride: 101 mmol/L (ref 96–106)
Creatinine, Ser: 0.87 mg/dL (ref 0.57–1.00)
Globulin, Total: 2.6 g/dL (ref 1.5–4.5)
Glucose: 81 mg/dL (ref 70–99)
Potassium: 4.9 mmol/L (ref 3.5–5.2)
Sodium: 138 mmol/L (ref 134–144)
Total Protein: 7 g/dL (ref 6.0–8.5)
eGFR: 82 mL/min/{1.73_m2} (ref >59)

## 2023-05-02 LAB — LIPID PANEL WITH LDL/HDL RATIO
Cholesterol, Total: 232 mg/dL — ABNORMAL HIGH (ref 100–199)
HDL: 68 mg/dL (ref >39)
LDL Chol Calc (NIH): 146 mg/dL — ABNORMAL HIGH (ref 0–99)
LDL/HDL Ratio: 2.1 ratio (ref 0.0–3.2)
Triglycerides: 105 mg/dL (ref 0–149)
VLDL Cholesterol Cal: 18 mg/dL (ref 5–40)

## 2023-05-02 LAB — MAGNESIUM: Magnesium: 2.1 mg/dL (ref 1.6–2.3)

## 2023-05-02 LAB — INSULIN, RANDOM: INSULIN: 11 u[IU]/mL (ref 2.6–24.9)

## 2023-05-02 LAB — PHOSPHORUS: Phosphorus: 4.2 mg/dL (ref 3.0–4.3)

## 2023-05-02 LAB — HEMOGLOBIN A1C
Est. average glucose Bld gHb Est-mCnc: 123 mg/dL
Hgb A1c MFr Bld: 5.9 % — ABNORMAL HIGH (ref 4.8–5.6)

## 2023-05-02 LAB — TSH: TSH: 0.669 u[IU]/mL (ref 0.450–4.500)

## 2023-05-02 LAB — VITAMIN D 25 HYDROXY (VIT D DEFICIENCY, FRACTURES): Vit D, 25-Hydroxy: 61.4 ng/mL (ref 30.0–100.0)

## 2023-05-02 LAB — VITAMIN B12: Vitamin B-12: 1119 pg/mL (ref 232–1245)

## 2023-05-02 NOTE — Progress Notes (Unsigned)
Chief Complaint:   OBESITY CAMAS KINSMAN (MR# [id number]) is a 47 y.o. female who presents for evaluation and treatment of obesity and related comorbidities. Current BMI is Body mass index is 48.75 kg/m. Chiann has been struggling with her weight for many years and has been unsuccessful in either losing weight, maintaining weight loss, or reaching her healthy weight goal.  Ajeenah is currently in the action stage of change and ready to dedicate time achieving and maintaining a healthier weight. Eloni is interested in becoming our patient and working on intensive lifestyle modifications including (but not limited to) diet and exercise for weight loss.  Elham's habits were reviewed today and are as follows: Her family eats meals together, she thinks her family will eat healthier with her, her desired weight loss is 78 lbs, she started gaining weight after she quit smoking in 2006, her heaviest weight ever was 260 pounds, she is a picky eater and doesn't like to eat healthier foods, she has significant food cravings issues, she snacks frequently in the evenings, she skips meals frequently, she is frequently drinking liquids with calories, she frequently makes poor food choices, she frequently eats larger portions than normal, she has binge eating behaviors, and she struggles with emotional eating.  Depression Screen Danyelle's Food and Mood (modified PHQ-9) score was 21.  Subjective:   1. Other fatigue Estell admits to daytime somnolence and admits to waking up still tired. Patient has a history of symptoms of daytime fatigue and morning fatigue. Serra generally gets 6+ hours of sleep per night, and states that she has nightime awakenings and generally restful sleep. Snoring is not present. Apneic episodes are not present. Epworth Sleepiness Score is 3.   2. SOBOE (shortness of breath on exertion) Braylon notes increasing shortness of breath with exercising and seems to be worsening over time with weight gain. She  notes getting out of breath sooner with activity than she used to. This has not gotten worse recently. Ronda denies shortness of breath at rest or orthopnea.  3. Acquired hypothyroidism Patient is on levothyroxine 75 mcg.  She has had a difficult time with getting her levels regulated.  4. Hyperglycemia Patient has a history of elevated glucose, and she notes polyphagia.  5. Emotional eating behavior Patient notes significant emotional eating behavior.  She has a therapist who is helping her get to a healthy relationship with food.  She had signs of eating disorders in the past.  Assessment/Plan:   1. Other fatigue Adilen does feel that her weight is causing her energy to be lower than it should be. Fatigue may be related to obesity, depression or many other causes. Labs will be ordered, and in the meanwhile, Avry will focus on self care including making healthy food choices, increasing physical activity and focusing on stress reduction.  - EKG 12-Lead - VITAMIN D 25 Hydroxy (Vit-D Deficiency, Fractures) - Magnesium - Phosphorus - CBC with Differential/Platelet  2. SOBOE (shortness of breath on exertion) Yuvia does feel that she gets out of breath more easily that she used to when she exercises. Audrey's shortness of breath appears to be obesity related and exercise induced. She has agreed to work on weight loss and gradually increase exercise to treat her exercise induced shortness of breath. Will continue to monitor closely.  - EKG 12-Lead  3. Acquired hypothyroidism We will check labs today, and we will follow as weight loss can affect her medication needs.  - TSH  4. Hyperglycemia  We will check labs today.  Patient will start her category 3 plan, and we will follow-up at her next visit.  - Vitamin B12 - CMP14+EGFR - Lipid Panel With LDL/HDL Ratio - Insulin, random - Hemoglobin A1c  5. Emotional eating behavior We will continue to monitor, and offer support as needed.  6.  Depression screening Darian had a positive depression screening. Depression is commonly associated with obesity and often results in emotional eating behaviors. We will monitor this closely and work on CBT to help improve the non-hunger eating patterns. Referral to Psychology may be required if no improvement is seen as she continues in our clinic.  7. BMI 45.0-49.9, adult (HCC)  8. Obesity, Beginning BMI 48.8 Ainsleigh is currently in the action stage of change and her goal is to continue with weight loss efforts. I recommend Phala begin the structured treatment plan as follows:  She has agreed to the Category 3 Plan.  Exercise goals: No exercise has been prescribed for now while we concentrate on nutritional changes.   Behavioral modification strategies: increasing lean protein intake and no skipping meals.  She was informed of the importance of frequent follow-up visits to maximize her success with intensive lifestyle modifications for her multiple health conditions. She was informed we would discuss her lab results at her next visit unless there is a critical issue that needs to be addressed sooner. Denee agreed to keep her next visit at the agreed upon time to discuss these results.  Objective:   Blood pressure 118/83, pulse 75, temperature 98.5 F (36.9 C), height 5\' 1"  (1.549 m), weight 258 lb (117 kg), SpO2 96%. Body mass index is 48.75 kg/m.  EKG: Normal sinus rhythm, rate 77 BPM.  Indirect Calorimeter completed today shows a VO2 of 324 and a REE of 2232.  Her calculated basal metabolic rate is 3474 thus her basal metabolic rate is better than expected.  General: Cooperative, alert, well developed, in no acute distress. HEENT: Conjunctivae and lids unremarkable. Cardiovascular: Regular rhythm.  Lungs: Normal work of breathing. Neurologic: No focal deficits.   Lab Results  Component Value Date   CREATININE 0.87 05/01/2023   BUN 18 05/01/2023   NA 138 05/01/2023   K 4.9 05/01/2023    CL 101 05/01/2023   CO2 24 05/01/2023   Lab Results  Component Value Date   ALT 22 05/01/2023   AST 19 05/01/2023   ALKPHOS 84 05/01/2023   BILITOT 0.4 05/01/2023   Lab Results  Component Value Date   HGBA1C 5.9 (H) 05/01/2023   HGBA1C 5.4 02/24/2018   HGBA1C 5.6 01/12/2017   Lab Results  Component Value Date   INSULIN 11.0 05/01/2023   Lab Results  Component Value Date   TSH 0.669 05/01/2023   Lab Results  Component Value Date   CHOL 232 (H) 05/01/2023   HDL 68 05/01/2023   LDLCALC 146 (H) 05/01/2023   TRIG 105 05/01/2023   CHOLHDL 2.4 02/24/2018   Lab Results  Component Value Date   WBC 8.9 05/01/2023   HGB 12.4 05/01/2023   HCT 37.9 05/01/2023   MCV 81 05/01/2023   PLT 412 05/01/2023   Lab Results  Component Value Date   IRON 77 11/14/2022   TIBC 287 11/14/2022   FERRITIN 235 (H) 11/14/2022   Attestation Statements:   Reviewed by clinician on day of visit: allergies, medications, problem list, medical history, surgical history, family history, social history, and previous encounter notes.  Time spent on visit including pre-visit  chart review and post-visit charting and care was 40 minutes.   I, Burt Knack, am acting as transcriptionist for Quillian Quince, MD.  I have reviewed the above documentation for accuracy and completeness, and I agree with the above. - ***

## 2023-05-04 DIAGNOSIS — F902 Attention-deficit hyperactivity disorder, combined type: Secondary | ICD-10-CM | POA: Diagnosis not present

## 2023-05-04 DIAGNOSIS — F411 Generalized anxiety disorder: Secondary | ICD-10-CM | POA: Diagnosis not present

## 2023-05-09 ENCOUNTER — Encounter (INDEPENDENT_AMBULATORY_CARE_PROVIDER_SITE_OTHER): Payer: Self-pay | Admitting: Family Medicine

## 2023-05-12 DIAGNOSIS — Z1211 Encounter for screening for malignant neoplasm of colon: Secondary | ICD-10-CM | POA: Diagnosis not present

## 2023-05-16 ENCOUNTER — Encounter (INDEPENDENT_AMBULATORY_CARE_PROVIDER_SITE_OTHER): Payer: Self-pay | Admitting: Family Medicine

## 2023-05-16 ENCOUNTER — Ambulatory Visit (INDEPENDENT_AMBULATORY_CARE_PROVIDER_SITE_OTHER): Payer: BC Managed Care – PPO | Admitting: Family Medicine

## 2023-05-16 VITALS — BP 125/84 | HR 84 | Temp 98.4°F | Ht 61.0 in | Wt 253.0 lb

## 2023-05-16 DIAGNOSIS — E039 Hypothyroidism, unspecified: Secondary | ICD-10-CM

## 2023-05-16 DIAGNOSIS — E785 Hyperlipidemia, unspecified: Secondary | ICD-10-CM | POA: Diagnosis not present

## 2023-05-16 DIAGNOSIS — E669 Obesity, unspecified: Secondary | ICD-10-CM

## 2023-05-16 DIAGNOSIS — E7800 Pure hypercholesterolemia, unspecified: Secondary | ICD-10-CM | POA: Insufficient documentation

## 2023-05-16 DIAGNOSIS — R7303 Prediabetes: Secondary | ICD-10-CM | POA: Insufficient documentation

## 2023-05-16 DIAGNOSIS — E559 Vitamin D deficiency, unspecified: Secondary | ICD-10-CM | POA: Insufficient documentation

## 2023-05-16 DIAGNOSIS — G8929 Other chronic pain: Secondary | ICD-10-CM

## 2023-05-16 DIAGNOSIS — Z6841 Body Mass Index (BMI) 40.0 and over, adult: Secondary | ICD-10-CM

## 2023-05-16 NOTE — Progress Notes (Signed)
.[facility]  Office: [phone]  /  Fax: [phone]  WEIGHT SUMMARY AND BIOMETRICS  Anthropometric Measurements Height: 5\' 1"  (1.549 m) Weight: 253 lb (114.8 kg) BMI (Calculated): 47.83 Weight at Last Visit: 258 lb Weight Lost Since Last Visit: 5 lb Weight Gained Since Last Visit: 0 Starting Weight: 258 lb Total Weight Loss (lbs): 5 lb (2.268 kg) Peak Weight: 258 lb   Body Composition  Body Fat %: 50.4 % Fat Mass (lbs): 127.8 lbs Muscle Mass (lbs): 119.4 lbs Total Body Water (lbs): 87.6 lbs Visceral Fat Rating : 17   Other Clinical Data Fasting: No Labs: No Today's Visit #: 2 Starting Date: 05/01/23    Chief Complaint: OBESITY   History of Present Illness   The patient is a 47 year old individual with a history of prediabetes, vitamin D deficiency, hyperlipidemia, and hypothyroidism, presenting today for a discussion on obesity and recent lab results. The patient has been following a category three eating plan, resulting in a weight loss of five pounds over the past two weeks. However, she reports difficulty adhering to the plan, particularly with the recommended meat intake, due to personal preference. The patient suggests a shift towards a vegetarian plan with added meat as needed.  The patient also reports a history of ADHD, which she believes affects her ability to adapt to dietary changes. She has sought therapy to help manage the mental strain associated with these changes. The patient expresses a desire for a personalized approach to her dietary plan, taking into consideration her mental and physical health.  The patient has a history of generalized pain, which she attributes to inflammation rather than depression. She reports that the pain persists regardless of her weight or physical activity level. The patient has previously sought treatment for this pain, including the use of Mobic. She expresses a desire to reduce this pain and improve her overall  functionality.  The patient has a family history of similar pain conditions and celiac disease. She has been mindful of her gluten intake due to her mother's diagnosis of celiac disease. However, she does not report any personal adverse reactions to gluten.  The patient has been on hormone replacement therapy, which she reports has caused some nausea. She expresses a reluctance to add any additional medications that may exacerbate this symptom. Specifically, she declines the suggestion of starting metformin for prediabetes due to concerns about potential side effects.  The patient has been taking over-the-counter vitamin D and has a good level of vitamin B12, despite a diet low in meat. She expresses curiosity about other potential nutritional deficiencies, such as magnesium and calcium, but lab results indicate that these levels are within normal ranges. The patient is interested in continuing to improve her diet to manage her prediabetes and inflammation, and to support weight loss.          PHYSICAL EXAM:  Blood pressure 125/84, pulse 84, temperature 98.4 F (36.9 C), height 5\' 1"  (1.549 m), weight 253 lb (114.8 kg), SpO2 99%. Body mass index is 47.8 kg/m.  DIAGNOSTIC DATA REVIEWED:  BMET    Component Value Date/Time   NA 138 05/01/2023 1505   K 4.9 05/01/2023 1505   CL 101 05/01/2023 1505   CO2 24 05/01/2023 1505   GLUCOSE 81 05/01/2023 1505   GLUCOSE 81 02/17/2021 1257   BUN 18 05/01/2023 1505   CREATININE 0.87 05/01/2023 1505   CALCIUM 9.6 05/01/2023 1505   GFRNONAA 77 02/24/2018 1428   GFRAA 88 02/24/2018 1428   Lab  Results  Component Value Date   HGBA1C 5.9 (H) 05/01/2023   HGBA1C 5.6 01/12/2017   Lab Results  Component Value Date   INSULIN 11.0 05/01/2023   Lab Results  Component Value Date   TSH 0.669 05/01/2023   CBC    Component Value Date/Time   WBC 8.9 05/01/2023 1505   WBC 8.7 02/17/2021 1257   RBC 4.69 05/01/2023 1505   RBC 4.79 02/17/2021 1257    HGB 12.4 05/01/2023 1505   HCT 37.9 05/01/2023 1505   PLT 412 05/01/2023 1505   MCV 81 05/01/2023 1505   MCH 26.4 (L) 05/01/2023 1505   MCH 27.1 12/16/2013 0932   MCHC 32.7 05/01/2023 1505   MCHC 33.5 02/17/2021 1257   RDW 13.6 05/01/2023 1505   Iron Studies    Component Value Date/Time   IRON 77 11/14/2022 1000   TIBC 287 11/14/2022 1000   FERRITIN 235 (H) 11/14/2022 1000   IRONPCTSAT 27 11/14/2022 1000   Lipid Panel     Component Value Date/Time   CHOL 232 (H) 05/01/2023 1505   TRIG 105 05/01/2023 1505   HDL 68 05/01/2023 1505   CHOLHDL 2.4 02/24/2018 1428   LDLCALC 146 (H) 05/01/2023 1505   Hepatic Function Panel     Component Value Date/Time   PROT 7.0 05/01/2023 1505   ALBUMIN 4.4 05/01/2023 1505   AST 19 05/01/2023 1505   ALT 22 05/01/2023 1505   ALKPHOS 84 05/01/2023 1505   BILITOT 0.4 05/01/2023 1505      Component Value Date/Time   TSH 0.669 05/01/2023 1505   Nutritional Lab Results  Component Value Date   VD25OH 61.4 05/01/2023   VD25OH 32.6 01/12/2017     Assessment and Plan    Obesity Patient has lost 5 pounds in the last two weeks following a category three eating plan. Not currently exercising. Discussed the importance of consistent diet and exercise for weight loss and overall health. -Continue category three eating plan or start the vegetarian plan, but stay with whichever plan she chooses for the entire day. -Consider incorporating regular exercise into routine.  Prediabetes Fasting glucose is normal, but A1C is 5.9, indicating prediabetes. Insulin level is elevated, suggesting insulin resistance. Discussed the role of diet in managing prediabetes and preventing progression to diabetes. Patient declined Metformin. -Continue dietary modifications to manage prediabetes. -Recheck A1C and insulin levels in 3 months.  Hyperlipidemia LDL cholesterol is elevated at 146. Discussed the role of diet in managing cholesterol levels. Plan to recheck  cholesterol levels in 3 months. -Continue dietary modifications to manage hyperlipidemia. -Recheck cholesterol levels in 3 months.  Hypothyroidism Thyroid function is currently well-controlled on Synthroid daily. Discussed potential need for dose adjustment with weight loss. -Continue Synthroid daily. -Monitor for symptoms of hyperthyroidism with weight loss.  Vitamin D deficiency Vitamin D level is currently within normal range on supplementation. Plan to continue current regimen. -Continue current Vitamin D supplementation.  Chronic Pain/Inflammation Patient reports chronic pain and inflammation. Discussed the role of weight loss and diet in managing inflammation. -Continue weight loss efforts and dietary modifications to manage inflammation.  General Health Maintenance -Continue B12 supplementation as needed. -Consider incorporating more protein and antioxidant-rich foods into diet. -Follow up in 2 weeks.         I have personally spent 45 minutes total time today in preparation, patient care, and documentation for this visit, including the following: review of clinical lab tests; review of medical tests/procedures/services.    She was  informed of the importance of frequent follow up visits to maximize her success with intensive lifestyle modifications for her multiple health conditions.    Quillian Quince, MD

## 2023-05-18 DIAGNOSIS — F411 Generalized anxiety disorder: Secondary | ICD-10-CM | POA: Diagnosis not present

## 2023-05-18 DIAGNOSIS — F902 Attention-deficit hyperactivity disorder, combined type: Secondary | ICD-10-CM | POA: Diagnosis not present

## 2023-05-18 LAB — EXTERNAL GENERIC LAB PROCEDURE: COLOGUARD: NEGATIVE

## 2023-05-18 LAB — COLOGUARD: COLOGUARD: NEGATIVE

## 2023-05-28 DIAGNOSIS — G4733 Obstructive sleep apnea (adult) (pediatric): Secondary | ICD-10-CM | POA: Diagnosis not present

## 2023-05-31 ENCOUNTER — Ambulatory Visit (INDEPENDENT_AMBULATORY_CARE_PROVIDER_SITE_OTHER): Payer: BC Managed Care – PPO | Admitting: Family Medicine

## 2023-05-31 ENCOUNTER — Encounter (INDEPENDENT_AMBULATORY_CARE_PROVIDER_SITE_OTHER): Payer: Self-pay | Admitting: Family Medicine

## 2023-05-31 VITALS — BP 113/78 | HR 91 | Temp 98.4°F | Ht 61.0 in | Wt 251.0 lb

## 2023-05-31 DIAGNOSIS — E669 Obesity, unspecified: Secondary | ICD-10-CM | POA: Diagnosis not present

## 2023-05-31 DIAGNOSIS — E7800 Pure hypercholesterolemia, unspecified: Secondary | ICD-10-CM | POA: Diagnosis not present

## 2023-05-31 DIAGNOSIS — Z6841 Body Mass Index (BMI) 40.0 and over, adult: Secondary | ICD-10-CM

## 2023-05-31 NOTE — Progress Notes (Unsigned)
Chief Complaint:   OBESITY Meagan [name] is here to discuss her progress with her obesity treatment plan along with follow-up of her obesity related diagnoses. Meagan [name] is on the Category 3 Plan and states she is following her eating plan approximately 25% of the time. Meagan [name] states she is doing 0 minutes 0 times per week.  Today's visit was #: 3 Starting weight: 258 lbs Starting date: 05/01/2023 Today's weight: 251 lbs Today's date: 05/31/2023 Total lbs lost to date: 7 Total lbs lost since last in-office visit: 2  Interim History: Patient continues to do well with her weight loss.  She is struggling to find food that meets her criteria and financially feasible.  Subjective:   1. Pure hypercholesterolemia Patient is working on her diet to help improve her hyperlipidemia.  She denies chest pain.  Assessment/Plan:   1. Pure hypercholesterolemia Patient will continue with her diet and exercise, and we will recheck labs in 1 to 2 months.  2. BMI 45.0-49.9, adult (HCC)  3. Obesity, Beginning BMI 48.8 Meagan [name] is currently in the action stage of change. As such, her goal is to continue with weight loss efforts. She has agreed to keeping a food journal and adhering to recommended goals of 1200-1600 calories and 100+ grams of protein daily.   Eating out handout was given and we discussed how to use Chat [employer] to find recipes.  Behavioral modification strategies: increasing lean protein intake and keeping a strict food journal.  Meagan [name] has agreed to follow-up with our clinic in 2 to 3 weeks. She was informed of the importance of frequent follow-up visits to maximize her success with intensive lifestyle modifications for her multiple health conditions.   Objective:   Blood pressure 113/78, pulse 91, temperature 98.4 F (36.9 C), height 5\' 1"  (1.549 m), weight 251 lb (113.9 kg), SpO2 98%. Body mass index is 47.43 kg/m.  Lab Results  Component Value Date   CREATININE 0.87 05/01/2023   BUN 18 05/01/2023    NA 138 05/01/2023   K 4.9 05/01/2023   CL 101 05/01/2023   CO2 24 05/01/2023   Lab Results  Component Value Date   ALT 22 05/01/2023   AST 19 05/01/2023   ALKPHOS 84 05/01/2023   BILITOT 0.4 05/01/2023   Lab Results  Component Value Date   HGBA1C 5.9 (H) 05/01/2023   HGBA1C 5.4 02/24/2018   HGBA1C 5.6 01/12/2017   Lab Results  Component Value Date   INSULIN 11.0 05/01/2023   Lab Results  Component Value Date   TSH 0.669 05/01/2023   Lab Results  Component Value Date   CHOL 232 (H) 05/01/2023   HDL 68 05/01/2023   LDLCALC 146 (H) 05/01/2023   TRIG 105 05/01/2023   CHOLHDL 2.4 02/24/2018   Lab Results  Component Value Date   VD25OH 61.4 05/01/2023   VD25OH 32.6 01/12/2017   Lab Results  Component Value Date   WBC 8.9 05/01/2023   HGB 12.4 05/01/2023   HCT 37.9 05/01/2023   MCV 81 05/01/2023   PLT 412 05/01/2023   Lab Results  Component Value Date   IRON 77 11/14/2022   TIBC 287 11/14/2022   FERRITIN 235 (H) 11/14/2022   Attestation Statements:   Reviewed by clinician on day of visit: allergies, medications, problem list, medical history, surgical history, family history, social history, and previous encounter notes.  Time spent on visit including pre-visit chart review and post-visit care and charting was 30 minutes.   Trude Mcburney,  am acting as transcriptionist for Quillian Quince, MD.  I have reviewed the above documentation for accuracy and completeness, and I agree with the above. -  Quillian Quince, MD

## 2023-06-01 DIAGNOSIS — F902 Attention-deficit hyperactivity disorder, combined type: Secondary | ICD-10-CM | POA: Diagnosis not present

## 2023-06-01 DIAGNOSIS — F411 Generalized anxiety disorder: Secondary | ICD-10-CM | POA: Diagnosis not present

## 2023-06-13 ENCOUNTER — Ambulatory Visit (INDEPENDENT_AMBULATORY_CARE_PROVIDER_SITE_OTHER): Payer: BC Managed Care – PPO | Admitting: Family Medicine

## 2023-06-13 ENCOUNTER — Encounter (INDEPENDENT_AMBULATORY_CARE_PROVIDER_SITE_OTHER): Payer: Self-pay | Admitting: Family Medicine

## 2023-06-13 VITALS — BP 127/81 | HR 95 | Temp 98.7°F | Ht 61.0 in | Wt 250.0 lb

## 2023-06-13 DIAGNOSIS — E669 Obesity, unspecified: Secondary | ICD-10-CM | POA: Diagnosis not present

## 2023-06-13 DIAGNOSIS — F32A Depression, unspecified: Secondary | ICD-10-CM | POA: Insufficient documentation

## 2023-06-13 DIAGNOSIS — F3289 Other specified depressive episodes: Secondary | ICD-10-CM

## 2023-06-13 DIAGNOSIS — Z6841 Body Mass Index (BMI) 40.0 and over, adult: Secondary | ICD-10-CM

## 2023-06-13 DIAGNOSIS — F5089 Other specified eating disorder: Secondary | ICD-10-CM | POA: Diagnosis not present

## 2023-06-13 MED ORDER — BUPROPION HCL ER (SR) 150 MG PO TB12: 150.0000 mg | ORAL_TABLET | Freq: Every morning | ORAL | 0 refills | Status: DC

## 2023-06-13 NOTE — Progress Notes (Unsigned)
Chief Complaint:   OBESITY Meagan [name] is here to discuss her progress with her obesity treatment plan along with follow-up of her obesity related diagnoses. Meagan [name] is on keeping a food journal and adhering to recommended goals of 1200-1600 calories and 100+ grams of protein and states she is following her eating plan approximately 0% of the time. Meagan [name] states she is doing 0 minutes 0 times per week.  Today's visit was #: 4 Starting weight: 258 lbs Starting date: 05/01/2023 Today's weight: 250 lbs Today's date: 06/13/2023 Total lbs lost to date: 8 Total lbs lost since last in-office visit: 1  Interim History: Patient has done well with her weight loss, but she is struggling with some increased emotional eating behavior.  She is tempted by pumpkin flavored food.  Subjective:   1. Emotional Eating Behavior Patient notes decreased mood and increased stress along with increased emotional eating behavior.  Assessment/Plan:   1. Emotional Eating Behavior Patient will continue Prozac as is, and she agreed to start Wellbutrin SR 150 mg every morning with no refills.  We will follow-up at her next visit in 1 month.  2. BMI 45.0-49.9, adult (HCC)  3. Obesity, Beginning BMI 48.8 Meagan [name] is currently in the action stage of change. As such, her goal is to continue with weight loss efforts. She has agreed to keeping a food journal and adhering to recommended goals of 1200-1300 calories and 100+ grams of protein daily.   Mini pumpkin pie recipe was given.  Behavioral modification strategies: emotional eating strategies.  Meagan [name] has agreed to follow-up with our clinic in 4 weeks. She was informed of the importance of frequent follow-up visits to maximize her success with intensive lifestyle modifications for her multiple health conditions.   Objective:   Blood pressure 127/81, pulse 95, temperature 98.7 F (37.1 C), height 5\' 1"  (1.549 m), weight 250 lb (113.4 kg), SpO2 96%. Body mass index is 47.24  kg/m.  Lab Results  Component Value Date   CREATININE 0.87 05/01/2023   BUN 18 05/01/2023   NA 138 05/01/2023   K 4.9 05/01/2023   CL 101 05/01/2023   CO2 24 05/01/2023   Lab Results  Component Value Date   ALT 22 05/01/2023   AST 19 05/01/2023   ALKPHOS 84 05/01/2023   BILITOT 0.4 05/01/2023   Lab Results  Component Value Date   HGBA1C 5.9 (H) 05/01/2023   HGBA1C 5.4 02/24/2018   HGBA1C 5.6 01/12/2017   Lab Results  Component Value Date   INSULIN 11.0 05/01/2023   Lab Results  Component Value Date   TSH 0.669 05/01/2023   Lab Results  Component Value Date   CHOL 232 (H) 05/01/2023   HDL 68 05/01/2023   LDLCALC 146 (H) 05/01/2023   TRIG 105 05/01/2023   CHOLHDL 2.4 02/24/2018   Lab Results  Component Value Date   VD25OH 61.4 05/01/2023   VD25OH 32.6 01/12/2017   Lab Results  Component Value Date   WBC 8.9 05/01/2023   HGB 12.4 05/01/2023   HCT 37.9 05/01/2023   MCV 81 05/01/2023   PLT 412 05/01/2023   Lab Results  Component Value Date   IRON 77 11/14/2022   TIBC 287 11/14/2022   FERRITIN 235 (H) 11/14/2022   Attestation Statements:   Reviewed by clinician on day of visit: allergies, medications, problem list, medical history, surgical history, family history, social history, and previous encounter notes.  Time spent on visit including pre-visit chart review and post-visit care and  charting was 30 minutes.   I, Burt Knack, am acting as transcriptionist for Quillian Quince, MD.  I have reviewed the above documentation for accuracy and completeness, and I agree with the above. -  Quillian Quince, MD

## 2023-06-27 DIAGNOSIS — Z0000 Encounter for general adult medical examination without abnormal findings: Secondary | ICD-10-CM | POA: Diagnosis not present

## 2023-06-27 DIAGNOSIS — G4733 Obstructive sleep apnea (adult) (pediatric): Secondary | ICD-10-CM | POA: Diagnosis not present

## 2023-06-29 DIAGNOSIS — F902 Attention-deficit hyperactivity disorder, combined type: Secondary | ICD-10-CM | POA: Diagnosis not present

## 2023-06-29 DIAGNOSIS — F411 Generalized anxiety disorder: Secondary | ICD-10-CM | POA: Diagnosis not present

## 2023-07-04 ENCOUNTER — Encounter (INDEPENDENT_AMBULATORY_CARE_PROVIDER_SITE_OTHER): Payer: Self-pay | Admitting: Family Medicine

## 2023-07-04 ENCOUNTER — Ambulatory Visit (INDEPENDENT_AMBULATORY_CARE_PROVIDER_SITE_OTHER): Payer: BC Managed Care – PPO | Admitting: Family Medicine

## 2023-07-04 VITALS — BP 126/77 | HR 97 | Temp 98.4°F | Ht 61.0 in | Wt 252.0 lb

## 2023-07-04 DIAGNOSIS — E669 Obesity, unspecified: Secondary | ICD-10-CM | POA: Diagnosis not present

## 2023-07-04 DIAGNOSIS — F3289 Other specified depressive episodes: Secondary | ICD-10-CM | POA: Diagnosis not present

## 2023-07-04 DIAGNOSIS — Z6841 Body Mass Index (BMI) 40.0 and over, adult: Secondary | ICD-10-CM

## 2023-07-04 DIAGNOSIS — R110 Nausea: Secondary | ICD-10-CM | POA: Diagnosis not present

## 2023-07-04 DIAGNOSIS — R7303 Prediabetes: Secondary | ICD-10-CM | POA: Diagnosis not present

## 2023-07-04 MED ORDER — METFORMIN HCL 500 MG PO TABS
250.0000 mg | ORAL_TABLET | Freq: Every day | ORAL | 0 refills | Status: DC
Start: 2023-07-04 — End: 2023-07-25

## 2023-07-04 NOTE — Progress Notes (Unsigned)
Chief Complaint:   OBESITY Meagan [name] is here to discuss her progress with her obesity treatment plan along with follow-up of her obesity related diagnoses. Meagan [name] is on keeping a food journal and adhering to recommended goals of 1200-1300 calories and 100+ grams of protein and states she is following her eating plan approximately 50% of the time. Meagan [name] states she is doing 0 minutes 0 times per week.  Today's visit was #: 5 Starting weight: 258 lbs Starting date: 05/01/2023 Today's weight: 252 lbs Today's date: 07/04/2023 Total lbs lost to date: 6 Total lbs lost since last in-office visit: 0  Interim History: Patient has struggled with her weight loss over the last 2 weeks.  She has snack tomorrow to help her deal with nausea.  Subjective:   1. Nausea Patient has a longstanding history of intense nausea even since childhood.  She has Zofran and she had to stop Wellbutrin.  2. Prediabetes Patient has an elevated glucose and A1c with increased polyphagia.  3. Emotional Eating Behavior Patient started Wellbutrin and had severe nausea so she stopped.  She is still working on decreasing emotional eating behavior.  Assessment/Plan:   1. Nausea Patient is okay to take probiotics and avoid lactose.  2. Prediabetes Patient agreed to start metformin 500 mg 1/2 tablet daily with food, with no refills.  We will follow-up at her next visit in 3 weeks.  - metFORMIN (GLUCOPHAGE) 500 MG tablet; Take 0.5 tablets (250 mg total) by mouth daily with breakfast.  Dispense: 15 tablet; Refill: 0  3. Emotional Eating Behavior Patient agreed to discontinue Wellbutrin, and will work on meal planning and prepping to help decrease emotional eating behavior.  4. BMI 45.0-49.9, adult (HCC)  5. Obesity, Beginning BMI 48.8 Meagan [name] is currently in the action stage of change. As such, her goal is to continue with weight loss efforts. She has agreed to keeping a food journal and adhering to recommended goals of  1200-1300 calories and 100+ grams of protein daily.   Behavioral modification strategies: increasing lean protein intake.  Meagan [name] has agreed to follow-up with our clinic in 3 weeks. She was informed of the importance of frequent follow-up visits to maximize her success with intensive lifestyle modifications for her multiple health conditions.   Objective:   Blood pressure 126/77, pulse 97, temperature 98.4 F (36.9 C), height 5\' 1"  (1.549 m), weight 252 lb (114.3 kg), SpO2 99%. Body mass index is 47.61 kg/m.  Lab Results  Component Value Date   CREATININE 0.87 05/01/2023   BUN 18 05/01/2023   NA 138 05/01/2023   K 4.9 05/01/2023   CL 101 05/01/2023   CO2 24 05/01/2023   Lab Results  Component Value Date   ALT 22 05/01/2023   AST 19 05/01/2023   ALKPHOS 84 05/01/2023   BILITOT 0.4 05/01/2023   Lab Results  Component Value Date   HGBA1C 5.9 (H) 05/01/2023   HGBA1C 5.4 02/24/2018   HGBA1C 5.6 01/12/2017   Lab Results  Component Value Date   INSULIN 11.0 05/01/2023   Lab Results  Component Value Date   TSH 0.669 05/01/2023   Lab Results  Component Value Date   CHOL 232 (H) 05/01/2023   HDL 68 05/01/2023   LDLCALC 146 (H) 05/01/2023   TRIG 105 05/01/2023   CHOLHDL 2.4 02/24/2018   Lab Results  Component Value Date   VD25OH 61.4 05/01/2023   VD25OH 32.6 01/12/2017   Lab Results  Component Value Date   WBC  8.9 05/01/2023   HGB 12.4 05/01/2023   HCT 37.9 05/01/2023   MCV 81 05/01/2023   PLT 412 05/01/2023   Lab Results  Component Value Date   IRON 77 11/14/2022   TIBC 287 11/14/2022   FERRITIN 235 (H) 11/14/2022   Attestation Statements:   Reviewed by clinician on day of visit: allergies, medications, problem list, medical history, surgical history, family history, social history, and previous encounter notes.   I, Burt Knack, am acting as transcriptionist for Quillian Quince, MD.  I have reviewed the above documentation for accuracy and  completeness, and I agree with the above. -  Quillian Quince, MD

## 2023-07-06 DIAGNOSIS — R87810 Cervical high risk human papillomavirus (HPV) DNA test positive: Secondary | ICD-10-CM | POA: Diagnosis not present

## 2023-07-13 DIAGNOSIS — F902 Attention-deficit hyperactivity disorder, combined type: Secondary | ICD-10-CM | POA: Diagnosis not present

## 2023-07-13 DIAGNOSIS — F411 Generalized anxiety disorder: Secondary | ICD-10-CM | POA: Diagnosis not present

## 2023-07-25 ENCOUNTER — Encounter (INDEPENDENT_AMBULATORY_CARE_PROVIDER_SITE_OTHER): Payer: Self-pay | Admitting: Family Medicine

## 2023-07-25 ENCOUNTER — Ambulatory Visit (INDEPENDENT_AMBULATORY_CARE_PROVIDER_SITE_OTHER): Payer: BC Managed Care – PPO | Admitting: Family Medicine

## 2023-07-25 VITALS — BP 121/84 | HR 91 | Temp 99.2°F | Ht 61.0 in | Wt 253.0 lb

## 2023-07-25 DIAGNOSIS — E669 Obesity, unspecified: Secondary | ICD-10-CM

## 2023-07-25 DIAGNOSIS — F5089 Other specified eating disorder: Secondary | ICD-10-CM | POA: Diagnosis not present

## 2023-07-25 DIAGNOSIS — Z6841 Body Mass Index (BMI) 40.0 and over, adult: Secondary | ICD-10-CM

## 2023-07-25 DIAGNOSIS — F3289 Other specified depressive episodes: Secondary | ICD-10-CM

## 2023-07-25 DIAGNOSIS — R7303 Prediabetes: Secondary | ICD-10-CM

## 2023-07-25 MED ORDER — TOPIRAMATE 25 MG PO TABS: 25.0000 mg | ORAL_TABLET | Freq: Every day | ORAL | 0 refills | Status: DC

## 2023-07-25 NOTE — Progress Notes (Signed)
.smr  Office: [phone]  /  Fax: [phone]  WEIGHT SUMMARY AND BIOMETRICS  Anthropometric Measurements Height: 5\' 1"  (1.549 m) Weight: 253 lb (114.8 kg) BMI (Calculated): 47.83 Weight at Last Visit: 252 lb Weight Lost Since Last Visit: 0 Weight Gained Since Last Visit: 1 lb Starting Weight: 258 lb Total Weight Loss (lbs): 5 lb (2.268 kg) Peak Weight: 258 lb   Body Composition  Body Fat %: 49.7 % Fat Mass (lbs): 126.2 lbs Muscle Mass (lbs): 121.2 lbs Total Body Water (lbs): 87 lbs Visceral Fat Rating : 17   Other Clinical Data Fasting: No Labs: No Today's Visit #: 6 Starting Date: 05/01/23    Chief Complaint: OBESITY  History of Present Illness   The patient, diagnosed with prediabetes and obesity, presents for a follow-up consultation. She reports a weight gain of one pound over the past three weeks and attributes this to a recent illness that disrupted her adherence to a category three eating plan. She also admits to not currently engaging in any exercise.  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 She denies experiencing any severe symptoms typically associated with flu, such as high fever or incapacitation.  The patient acknowledges a struggle with emotional eating, describing it as a dopamine-driven response. She reports a pattern of binge eating, which she defines as eating when not hungry and larger than normal portions. She expresses concern about this behavior, likening it to an addiction, and is seeking ways to manage it.  The patient is currently on metformin for prediabetes but expresses reluctance to continue due to fear of side effects. She also expresses a desire to avoid additional medications if possible.  The patient also discusses her family history of addiction, specifically alcohol addiction, and wonders if this has influenced her  relationship with food. She expresses frustration with her inability to control her cravings for simple carbohydrates, particularly sweets, and is seeking strategies to manage these cravings. She also notes a change in her taste preferences when consuming high amounts of sugar, which she believes may be contributing to her weight gain.          PHYSICAL EXAM:  Blood pressure 121/84, pulse 91, temperature 99.2 F (37.3 C), height 5\' 1"  (1.549 m), weight 253 lb (114.8 kg), SpO2 97%. Body mass index is 47.8 kg/m.  DIAGNOSTIC DATA REVIEWED:  BMET    Component Value Date/Time   NA 138 05/01/2023 1505   K 4.9 05/01/2023 1505   CL 101 05/01/2023 1505   CO2 24 05/01/2023 1505   GLUCOSE 81 05/01/2023 1505   GLUCOSE 81 02/17/2021 1257   BUN 18 05/01/2023 1505   CREATININE 0.87 05/01/2023 1505   CALCIUM 9.6 05/01/2023 1505   GFRNONAA 77 02/24/2018 1428   GFRAA 88 02/24/2018 1428   Lab Results  Component Value Date   HGBA1C 5.9 (H) 05/01/2023   HGBA1C 5.6 01/12/2017   Lab Results  Component Value Date   INSULIN 11.0 05/01/2023   Lab Results  Component Value Date   TSH 0.669 05/01/2023   CBC    Component Value Date/Time   WBC 8.9 05/01/2023 1505   WBC 8.7 02/17/2021 1257   RBC 4.69 05/01/2023 1505   RBC 4.79 02/17/2021 1257   HGB 12.4 05/01/2023 1505   HCT 37.9 05/01/2023 1505   PLT 412 05/01/2023 1505   MCV 81 05/01/2023 1505   MCH 26.4 (L) 05/01/2023  1505   MCH 27.1 12/16/2013 0932   MCHC 32.7 05/01/2023 1505   MCHC 33.5 02/17/2021 1257   RDW 13.6 05/01/2023 1505   Iron Studies    Component Value Date/Time   IRON 77 11/14/2022 1000   TIBC 287 11/14/2022 1000   FERRITIN 235 (H) 11/14/2022 1000   IRONPCTSAT 27 11/14/2022 1000   Lipid Panel     Component Value Date/Time   CHOL 232 (H) 05/01/2023 1505   TRIG 105 05/01/2023 1505   HDL 68 05/01/2023 1505   CHOLHDL 2.4 02/24/2018 1428   LDLCALC 146 (H) 05/01/2023 1505   Hepatic Function Panel     Component  Value Date/Time   PROT 7.0 05/01/2023 1505   ALBUMIN 4.4 05/01/2023 1505   AST 19 05/01/2023 1505   ALT 22 05/01/2023 1505   ALKPHOS 84 05/01/2023 1505   BILITOT 0.4 05/01/2023 1505      Component Value Date/Time   TSH 0.669 05/01/2023 1505   Nutritional Lab Results  Component Value Date   VD25OH 61.4 05/01/2023   VD25OH 32.6 01/12/2017     Assessment and Plan    Obesity Obesity with recent weight gain of one pound over three weeks. Emotional and binge eating behaviors noted. Not currently exercising. Discussed topiramate for weight loss and binge eating control, including its use as a mood stabilizer. Emphasized Thanksgiving eating strategies: protein intake, portion control, and mindful eating to prevent muscle mass loss. - Continue Category 3 eating plan and work on increasing activity - Schedule follow-up appointment after Thanksgiving but before Christmas to assess progress  Prediabetes Prediabetes managed with metformin. Reports difficulty adhering to dietary recommendations due to recent illness and emotional eating. Concerns about metformin side effects and inconsistent use. Discussed discontinuing metformin - Discontinue metformin -Continue to work on diet and exercise   Emotional eating behaviors - Start topiramate 25 mg, can be taken morning or night - Ensure reliable birth control due to teratogenic risk of topiramate  General Health Maintenance Discussed strategies for managing food intake during holidays and the importance of protein intake. - Encourage hydration - Discuss strategies for managing food intake during holidays (protein intake, portion control, mindful eating)  Follow-up - Schedule follow-up appointment after Thanksgiving but before Christmas.      She was informed of the importance of frequent follow up visits to maximize her success with intensive lifestyle modifications for her multiple health conditions.    Quillian Quince, MD

## 2023-07-26 ENCOUNTER — Encounter (INDEPENDENT_AMBULATORY_CARE_PROVIDER_SITE_OTHER): Payer: Self-pay | Admitting: Family Medicine

## 2023-07-27 DIAGNOSIS — F902 Attention-deficit hyperactivity disorder, combined type: Secondary | ICD-10-CM | POA: Diagnosis not present

## 2023-07-27 DIAGNOSIS — F411 Generalized anxiety disorder: Secondary | ICD-10-CM | POA: Diagnosis not present

## 2023-07-28 DIAGNOSIS — G4733 Obstructive sleep apnea (adult) (pediatric): Secondary | ICD-10-CM | POA: Diagnosis not present

## 2023-08-10 DIAGNOSIS — F411 Generalized anxiety disorder: Secondary | ICD-10-CM | POA: Diagnosis not present

## 2023-08-10 DIAGNOSIS — F902 Attention-deficit hyperactivity disorder, combined type: Secondary | ICD-10-CM | POA: Diagnosis not present

## 2023-08-15 ENCOUNTER — Ambulatory Visit (INDEPENDENT_AMBULATORY_CARE_PROVIDER_SITE_OTHER): Payer: BC Managed Care – PPO | Admitting: Family Medicine

## 2023-08-15 ENCOUNTER — Encounter (INDEPENDENT_AMBULATORY_CARE_PROVIDER_SITE_OTHER): Payer: Self-pay | Admitting: Family Medicine

## 2023-08-15 VITALS — BP 116/79 | HR 93 | Temp 98.9°F | Ht 61.0 in | Wt 252.0 lb

## 2023-08-15 DIAGNOSIS — F5089 Other specified eating disorder: Secondary | ICD-10-CM

## 2023-08-15 DIAGNOSIS — R7303 Prediabetes: Secondary | ICD-10-CM | POA: Diagnosis not present

## 2023-08-15 DIAGNOSIS — F3289 Other specified depressive episodes: Secondary | ICD-10-CM

## 2023-08-15 DIAGNOSIS — Z6841 Body Mass Index (BMI) 40.0 and over, adult: Secondary | ICD-10-CM

## 2023-08-15 DIAGNOSIS — E669 Obesity, unspecified: Secondary | ICD-10-CM | POA: Diagnosis not present

## 2023-08-15 MED ORDER — TOPIRAMATE 25 MG PO TABS: 25.0000 mg | ORAL_TABLET | Freq: Every day | ORAL | 0 refills | Status: DC

## 2023-08-15 NOTE — Progress Notes (Addendum)
.smr  Office: [phone]  /  Fax: [phone]  WEIGHT SUMMARY AND BIOMETRICS  Anthropometric Measurements Height: 5\' 1"  (1.549 m) Weight: 252 lb (114.3 kg) BMI (Calculated): 47.64 Weight at Last Visit: 253 lb Weight Lost Since Last Visit: 1 lb Weight Gained Since Last Visit: 0 Starting Weight: 258 lb Total Weight Loss (lbs): 6 lb (2.722 kg) Peak Weight: 258 lb   Body Composition  Body Fat %: 49.8 % Fat Mass (lbs): 125.4 lbs Muscle Mass (lbs): 120.2 lbs Total Body Water (lbs): 87.6 lbs Visceral Fat Rating : 17   Other Clinical Data Fasting: no Labs: no Today's Visit #: 7 Starting Date: 05/01/23    Chief Complaint: OBESITY   History of Present Illness   The patient, with a history of depression, emotional eating behaviors, and obesity, presents for a follow-up consultation. She reports a recent weight loss of one pound over the last month, including over the Thanksgiving holiday. She attributes this success to a strategy of portion control, ensuring her food did not touch on the plate, which she found surprisingly easy to adhere to. She plans to continue this strategy for future large meals, including the upcoming Christmas holiday.  The patient is not currently following a structured eating plan or exercise regimen. She has been using Hello Fresh three times a week, sticking to meals around 600 calories, and making substitutions to reduce oil and butter in her meals. 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  The patient reports occasional tingling in her hands and feet since starting topiramate, which she attributes to the medication rather than a potential neuropathy. She also notes an inability to tolerate alcohol since starting the medication, experiencing significant nausea after consuming a single drink.  The patient recently switched back to [place] from [place] for  birth control, due to perceived mood swings and depression since starting [place]. She reports feeling more level-headed on [place] in the past. She also mentions a need for better supportive shoes due to wide feet and discomfort when walking.          PHYSICAL EXAM:  Blood pressure 116/79, pulse 93, temperature 98.9 F (37.2 C), height 5\' 1"  (1.549 m), weight 252 lb (114.3 kg), SpO2 97%. Body mass index is 47.61 kg/m.  DIAGNOSTIC DATA REVIEWED:  BMET    Component Value Date/Time   NA 138 05/01/2023 1505   K 4.9 05/01/2023 1505   CL 101 05/01/2023 1505   CO2 24 05/01/2023 1505   GLUCOSE 81 05/01/2023 1505   GLUCOSE 81 02/17/2021 1257   BUN 18 05/01/2023 1505   CREATININE 0.87 05/01/2023 1505   CALCIUM 9.6 05/01/2023 1505   GFRNONAA 77 02/24/2018 1428   GFRAA 88 02/24/2018 1428   Lab Results  Component Value Date   HGBA1C 5.9 (H) 05/01/2023   HGBA1C 5.6 01/12/2017   Lab Results  Component Value Date   INSULIN 11.0 05/01/2023   Lab Results  Component Value Date   TSH 0.669 05/01/2023   CBC    Component Value Date/Time   WBC 8.9 05/01/2023 1505   WBC 8.7 02/17/2021 1257   RBC 4.69 05/01/2023 1505   RBC 4.79 02/17/2021 1257   HGB 12.4 05/01/2023 1505   HCT 37.9 05/01/2023 1505   PLT 412 05/01/2023 1505   MCV 81 05/01/2023 1505   MCH 26.4 (L) 05/01/2023 1505   MCH 27.1 12/16/2013 0932   MCHC 32.7 05/01/2023  1505   MCHC 33.5 02/17/2021 1257   RDW 13.6 05/01/2023 1505   Iron Studies    Component Value Date/Time   IRON 77 11/14/2022 1000   TIBC 287 11/14/2022 1000   FERRITIN 235 (H) 11/14/2022 1000   IRONPCTSAT 27 11/14/2022 1000   Lipid Panel     Component Value Date/Time   CHOL 232 (H) 05/01/2023 1505   TRIG 105 05/01/2023 1505   HDL 68 05/01/2023 1505   CHOLHDL 2.4 02/24/2018 1428   LDLCALC 146 (H) 05/01/2023 1505   Hepatic Function Panel     Component Value Date/Time   PROT 7.0 05/01/2023 1505   ALBUMIN 4.4 05/01/2023 1505   AST 19 05/01/2023  1505   ALT 22 05/01/2023 1505   ALKPHOS 84 05/01/2023 1505   BILITOT 0.4 05/01/2023 1505      Component Value Date/Time   TSH 0.669 05/01/2023 1505   Nutritional Lab Results  Component Value Date   VD25OH 61.4 05/01/2023   VD25OH 32.6 01/12/2017     Assessment and Plan    Depression with Emotional Eating Behaviors Ongoing depression with emotional eating, mood swings, and depressive symptoms attributed to [place]. Switched back to [place] for stable mood control. Experiencing tingling in hands and feet, likely due to topiramate, which is a common side effect that usually resolves within three to four months. Increasing the dose may worsen tingling. - Continue Kariva for birth control and mood stabilization - Monitor mood and depressive symptoms - Continue topiramate and monitor for tingling symptoms - Consider dose adjustment of topiramate if tingling persists or worsens  Obesity Lost one pound over the last month, including through Thanksgiving. Not following a structured eating plan or exercising regularly. Using portion control and low-calorie meal options like Hello Fresh. Planning healthier choices during Christmas. Discussed benefits of portion control, low-calorie options, structured eating plans, and regular exercise for weight loss. - Continue portion control and smart food choices - Incorporate low-fat and low-calorie options in holiday meals - Consider starting a structured eating plan and regular exercise regimen - Follow up in January for weight management  Prediabetes Concerned about neuropathy due to family history. Reports burning sensation in feet, likely due to poor footwear. Tingling in hands and feet attributed to topiramate. Discussed importance of supportive footwear and recommended specific brands with wide toe boxes and good arch support. - Recommend wearing supportive shoes with wide toe boxes and good arch support - Consider brands like Bionic, Dansko, and  Altra - Monitor blood glucose levels and continue lifestyle modifications to manage prediabetes  General Health Maintenance Maintaining a healthy lifestyle by choosing low-calorie meal options and avoiding high-sugar foods. Avoiding alcohol due to adverse reactions with topiramate. - Continue avoiding alcohol - Maintain healthy eating habits and avoid high-sugar foods  Follow-up - Schedule follow-up appointment in February.       She was informed of the importance of frequent follow up visits to maximize her success with intensive lifestyle modifications for her multiple health conditions.    Quillian Quince, MD

## 2023-08-24 DIAGNOSIS — F902 Attention-deficit hyperactivity disorder, combined type: Secondary | ICD-10-CM | POA: Diagnosis not present

## 2023-08-24 DIAGNOSIS — F411 Generalized anxiety disorder: Secondary | ICD-10-CM | POA: Diagnosis not present

## 2023-08-27 DIAGNOSIS — G4733 Obstructive sleep apnea (adult) (pediatric): Secondary | ICD-10-CM | POA: Diagnosis not present

## 2023-09-05 DIAGNOSIS — G4733 Obstructive sleep apnea (adult) (pediatric): Secondary | ICD-10-CM | POA: Diagnosis not present

## 2023-09-12 ENCOUNTER — Other Ambulatory Visit (INDEPENDENT_AMBULATORY_CARE_PROVIDER_SITE_OTHER): Payer: Self-pay | Admitting: Family Medicine

## 2023-09-12 DIAGNOSIS — F3289 Other specified depressive episodes: Secondary | ICD-10-CM

## 2023-09-21 DIAGNOSIS — F902 Attention-deficit hyperactivity disorder, combined type: Secondary | ICD-10-CM | POA: Diagnosis not present

## 2023-09-21 DIAGNOSIS — F411 Generalized anxiety disorder: Secondary | ICD-10-CM | POA: Diagnosis not present

## 2023-09-26 ENCOUNTER — Encounter (INDEPENDENT_AMBULATORY_CARE_PROVIDER_SITE_OTHER): Payer: Self-pay | Admitting: Family Medicine

## 2023-09-26 ENCOUNTER — Ambulatory Visit (INDEPENDENT_AMBULATORY_CARE_PROVIDER_SITE_OTHER): Payer: BC Managed Care – PPO | Admitting: Family Medicine

## 2023-09-26 VITALS — BP 136/83 | HR 99 | Temp 97.8°F | Ht 61.0 in | Wt 254.0 lb

## 2023-09-26 DIAGNOSIS — R202 Paresthesia of skin: Secondary | ICD-10-CM | POA: Diagnosis not present

## 2023-09-26 DIAGNOSIS — Z6841 Body Mass Index (BMI) 40.0 and over, adult: Secondary | ICD-10-CM

## 2023-09-26 DIAGNOSIS — G4733 Obstructive sleep apnea (adult) (pediatric): Secondary | ICD-10-CM

## 2023-09-26 DIAGNOSIS — G4730 Sleep apnea, unspecified: Secondary | ICD-10-CM | POA: Diagnosis not present

## 2023-09-26 DIAGNOSIS — I158 Other secondary hypertension: Secondary | ICD-10-CM | POA: Insufficient documentation

## 2023-09-26 DIAGNOSIS — F3289 Other specified depressive episodes: Secondary | ICD-10-CM

## 2023-09-26 DIAGNOSIS — E669 Obesity, unspecified: Secondary | ICD-10-CM

## 2023-09-26 DIAGNOSIS — I10 Essential (primary) hypertension: Secondary | ICD-10-CM | POA: Diagnosis not present

## 2023-09-26 HISTORY — DX: Other secondary hypertension: I15.8

## 2023-09-26 NOTE — Progress Notes (Signed)
.smr  Office: [phone]  /  Fax: [phone]  WEIGHT SUMMARY AND BIOMETRICS  Anthropometric Measurements Height: 5\' 1"  (1.549 m) Weight: 254 lb (115.2 kg) BMI (Calculated): 48.02 Weight at Last Visit: 252 lb Weight Lost Since Last Visit: 0 Weight Gained Since Last Visit: 2 lb Starting Weight: 258 lb Total Weight Loss (lbs): 4 lb (1.814 kg) Peak Weight: 258 lb   Body Composition  Body Fat %: 50.6 % Fat Mass (lbs): 128.6 lbs Muscle Mass (lbs): 119 lbs Total Body Water (lbs): 87.8 lbs Visceral Fat Rating : 17   Other Clinical Data Fasting: No Labs: No Today's Visit #: 8 Starting Date: 05/01/23    Chief Complaint: OBESITY   History of Present Illness   The patient, with a history of obesity, emotional eating behaviors, and mood disorders, presents for a follow-up consultation. Over the past month, which included the holiday season, she has gained two pounds. She reports adherence to the category three eating plan about fifty percent of the time and is currently not engaging in any exercise. Her current medication regimen includes topiramate, Prozac, and Xanax.  The patient recently experienced a significant depressive episode following the death of a friend, compounded by the seasonal difficulties she typically faces in January, the month her mother passed away. She reports struggling with dietary choices, often opting for a bowl of cereal over protein-rich options like eggs. She expresses a desire to focus more on fiber intake, suggesting the use of protein shakes to supplement her protein intake.  The patient also reports experiencing tingling in her heels upon waking, which she suspects may be a side effect of the topiramate. This symptom has been causing her anxiety. She expresses a desire to discontinue the topiramate to determine if it is the cause of the tingling.  The patient has been experiencing higher than usual blood pressure readings at home, which she  believes may be artificially high due to the small size of her home blood pressure cuff. She is considering discontinuing her birth control pill, suspecting it may be contributing to the elevated blood pressure.  Sleep quality has been poor due to disturbances from her dogs and husband's irregular sleep schedule. She has been considering sleeping in separate bedrooms for the sake of her health, despite her husband's objections. She has a history of sleep apnea and expresses dissatisfaction with her CPAP machine.  The patient is interested in improving her dietary habits, focusing on fiber intake, and managing her blood pressure and sleep quality. She is considering changes to her medication regimen to manage side effects and overall health.          PHYSICAL EXAM:  Blood pressure 136/83, pulse 99, temperature 97.8 F (36.6 C), height 5\' 1"  (1.549 m), weight 254 lb (115.2 kg), SpO2 100%. Body mass index is 47.99 kg/m.  DIAGNOSTIC DATA REVIEWED:  BMET    Component Value Date/Time   NA 138 05/01/2023 1505   K 4.9 05/01/2023 1505   CL 101 05/01/2023 1505   CO2 24 05/01/2023 1505   GLUCOSE 81 05/01/2023 1505   GLUCOSE 81 02/17/2021 1257   BUN 18 05/01/2023 1505   CREATININE 0.87 05/01/2023 1505   CALCIUM 9.6 05/01/2023 1505   GFRNONAA 77 02/24/2018 1428   GFRAA 88 02/24/2018 1428   Lab Results  Component Value Date   HGBA1C 5.9 (H) 05/01/2023   HGBA1C 5.6 01/12/2017   Lab Results  Component Value Date   INSULIN 11.0 05/01/2023   Lab Results  Component Value Date   TSH 0.669 05/01/2023   CBC    Component Value Date/Time   WBC 8.9 05/01/2023 1505   WBC 8.7 02/17/2021 1257   RBC 4.69 05/01/2023 1505   RBC 4.79 02/17/2021 1257   HGB 12.4 05/01/2023 1505   HCT 37.9 05/01/2023 1505   PLT 412 05/01/2023 1505   MCV 81 05/01/2023 1505   MCH 26.4 (L) 05/01/2023 1505   MCH 27.1 12/16/2013 0932   MCHC 32.7 05/01/2023 1505   MCHC 33.5 02/17/2021 1257   RDW 13.6 05/01/2023  1505   Iron Studies    Component Value Date/Time   IRON 77 11/14/2022 1000   TIBC 287 11/14/2022 1000   FERRITIN 235 (H) 11/14/2022 1000   IRONPCTSAT 27 11/14/2022 1000   Lipid Panel     Component Value Date/Time   CHOL 232 (H) 05/01/2023 1505   TRIG 105 05/01/2023 1505   HDL 68 05/01/2023 1505   CHOLHDL 2.4 02/24/2018 1428   LDLCALC 146 (H) 05/01/2023 1505   Hepatic Function Panel     Component Value Date/Time   PROT 7.0 05/01/2023 1505   ALBUMIN 4.4 05/01/2023 1505   AST 19 05/01/2023 1505   ALT 22 05/01/2023 1505   ALKPHOS 84 05/01/2023 1505   BILITOT 0.4 05/01/2023 1505      Component Value Date/Time   TSH 0.669 05/01/2023 1505   Nutritional Lab Results  Component Value Date   VD25OH 61.4 05/01/2023   VD25OH 32.6 01/12/2017     Assessment and Plan    Obesity Reports a 2-pound weight gain over the last month, likely influenced by holiday eating. Following the category three eating plan about 50% of the time and not exercising. Emotional eating behaviors noted, particularly during stressful periods such as the holidays and the anniversary of her mother's death. Discussed the importance of protein and fiber intake. Expressed difficulty in meeting protein goals but is willing to try protein shakes and focus on fiber intake. Discussed that some high-fiber foods also contain protein, such as beans and peas. - Encourage adherence to the eating plan - Incorporate protein shakes to meet protein goals - Focus on increasing fiber intake through foods like beans, peas, and high-fiber wraps - Schedule follow-up appointment for October 24, 2023  Hypertension Reports elevated blood pressure readings at home, potentially due to an improperly fitting cuff. Blood pressure readings at the clinic are higher than typical but not critically high. Discussed the possibility of birth control contributing to elevated blood pressure. Recommended obtaining a properly fitting blood  pressure cuff from [employer] or [employer] and monitoring blood pressure at home with the new cuff. - Provide accurate blood pressure reading to primary care provider - Recommend obtaining a properly fitting blood pressure cuff from [employer] or [employer] - Monitor blood pressure at home with the new cuff  Tingling Sensation in Heels Reports tingling in heels, potentially related to topiramate use. Discussed that tingling is a known side effect of topiramate, especially at higher doses, but can also be related to other conditions like restless leg syndrome. Advised that if topiramate is the cause, symptoms should resolve within two weeks of discontinuation. Explained that if symptoms persist beyond two weeks, topiramate is unlikely the cause. - Discontinue topiramate - Monitor symptoms for two weeks to assess resolution of tingling  Sleep Apnea Using a CPAP machine, which she finds uncomfortable. Discussed the importance of quality sleep for overall health and the potential benefits of separate sleeping arrangements to improve sleep  quality. Mentioned that Zepbound is not currently approved for sleep apnea by insurance companies, but the situation is being monitored. - Continue using CPAP machine - Consider separate sleeping arrangements to improve sleep quality  General Health Maintenance Discussed the importance of maintaining a balanced diet, regular exercise, and monitoring blood pressure. Emphasized the role of quality sleep in overall health. - Encourage regular exercise - Maintain a balanced diet with adequate protein and fiber - Monitor blood pressure regularly with a properly fitting cuff  Follow-up - Follow-up appointment on October 24, 2023 - Schedule additional follow-up for March 2025.       She was informed of the importance of frequent follow up visits to maximize her success with intensive lifestyle modifications for her multiple health conditions.    Quillian Quince, MD

## 2023-09-27 DIAGNOSIS — G4733 Obstructive sleep apnea (adult) (pediatric): Secondary | ICD-10-CM | POA: Diagnosis not present

## 2023-10-05 DIAGNOSIS — F902 Attention-deficit hyperactivity disorder, combined type: Secondary | ICD-10-CM | POA: Diagnosis not present

## 2023-10-05 DIAGNOSIS — F411 Generalized anxiety disorder: Secondary | ICD-10-CM | POA: Diagnosis not present

## 2023-10-19 DIAGNOSIS — F411 Generalized anxiety disorder: Secondary | ICD-10-CM | POA: Diagnosis not present

## 2023-10-19 DIAGNOSIS — F902 Attention-deficit hyperactivity disorder, combined type: Secondary | ICD-10-CM | POA: Diagnosis not present

## 2023-10-20 ENCOUNTER — Encounter (INDEPENDENT_AMBULATORY_CARE_PROVIDER_SITE_OTHER): Payer: Self-pay | Admitting: Family Medicine

## 2023-10-24 ENCOUNTER — Ambulatory Visit (INDEPENDENT_AMBULATORY_CARE_PROVIDER_SITE_OTHER): Payer: BC Managed Care – PPO | Admitting: Family Medicine

## 2023-11-02 DIAGNOSIS — F411 Generalized anxiety disorder: Secondary | ICD-10-CM | POA: Diagnosis not present

## 2023-11-02 DIAGNOSIS — F902 Attention-deficit hyperactivity disorder, combined type: Secondary | ICD-10-CM | POA: Diagnosis not present

## 2023-11-16 DIAGNOSIS — F411 Generalized anxiety disorder: Secondary | ICD-10-CM | POA: Diagnosis not present

## 2023-11-16 DIAGNOSIS — F902 Attention-deficit hyperactivity disorder, combined type: Secondary | ICD-10-CM | POA: Diagnosis not present

## 2023-11-22 ENCOUNTER — Encounter (INDEPENDENT_AMBULATORY_CARE_PROVIDER_SITE_OTHER): Payer: Self-pay | Admitting: Family Medicine

## 2023-11-22 ENCOUNTER — Ambulatory Visit (INDEPENDENT_AMBULATORY_CARE_PROVIDER_SITE_OTHER): Payer: BC Managed Care – PPO | Admitting: Family Medicine

## 2023-11-22 VITALS — BP 135/84 | HR 67 | Temp 98.5°F | Ht 61.0 in | Wt 260.0 lb

## 2023-11-22 DIAGNOSIS — F32A Depression, unspecified: Secondary | ICD-10-CM | POA: Diagnosis not present

## 2023-11-22 DIAGNOSIS — E669 Obesity, unspecified: Secondary | ICD-10-CM

## 2023-11-22 DIAGNOSIS — F50819 Binge eating disorder, unspecified: Secondary | ICD-10-CM | POA: Diagnosis not present

## 2023-11-22 DIAGNOSIS — Z6841 Body Mass Index (BMI) 40.0 and over, adult: Secondary | ICD-10-CM

## 2023-11-22 DIAGNOSIS — R030 Elevated blood-pressure reading, without diagnosis of hypertension: Secondary | ICD-10-CM

## 2023-11-22 DIAGNOSIS — R632 Polyphagia: Secondary | ICD-10-CM

## 2023-11-22 DIAGNOSIS — F418 Other specified anxiety disorders: Secondary | ICD-10-CM

## 2023-11-22 MED ORDER — NALTREXONE HCL 50 MG PO TABS: 25.0000 mg | ORAL_TABLET | Freq: Every day | ORAL | 0 refills | Status: DC

## 2023-11-22 NOTE — Progress Notes (Signed)
 Office: [phone]  /  Fax: [phone]  WEIGHT SUMMARY AND BIOMETRICS  Anthropometric Measurements Height: 5\' 1"  (1.549 m) Weight: 260 lb (117.9 kg) BMI (Calculated): 49.15 Weight at Last Visit: 254 lb Weight Lost Since Last Visit: 0 Weight Gained Since Last Visit: 6 lb Starting Weight: 258 Total Weight Loss (lbs): 0 lb (0 kg) Peak Weight: 258 lb   Body Composition  Body Fat %: 50.8 % Fat Mass (lbs): 132.2 lbs Muscle Mass (lbs): 121.6 lbs Total Body Water (lbs): 92 lbs Visceral Fat Rating : 18   Other Clinical Data Fasting: No Labs: No Today's Visit #: 9 Starting Date: 05/01/23    Chief Complaint: OBESITY   Discussed the use of AI scribe software for clinical note transcription with the patient, who gave verbal consent to proceed.  History of Present Illness   Meagan [name] is a 48 year old female who presents for evaluation of her obesity treatment plan and progress.  She has been able to follow her category three obesity treatment plan about fifty percent of the time and is not currently exercising. She has gained six pounds in the last two months since her last visit.  She experiences a resurgence of binge eating behaviors, characterized by an obsession with sugar and eating sugar even when not hungry. She describes this as a 'rollercoaster of emotions' and notes that she has not experienced binge eating in years. Her binge episodes involve consuming sugar, but she clarifies that 'one's enough' rather than consuming large quantities.  She mentions a change in her birth control but states she is on the same one she loves. There is concern about her blood pressure, with home readings at 145 mmHg and clinic readings at 135 mmHg. She has purchased a large cuff for home monitoring.  She experiences difficulty falling asleep and averages six hours of sleep per night. She takes thyroid medication in the morning and is concerned about spacing it with other  medications. She recalls a previous experience with Topamax, which caused tingling in her feet, and a negative experience with Wellbutrin, which she believes was due to nausea.  Her family history includes addiction issues in her parents and brother, which she relates to her current eating behaviors.          PHYSICAL EXAM:  Blood pressure 135/84, pulse 67, temperature 98.5 F (36.9 C), height 5\' 1"  (1.549 m), weight 260 lb (117.9 kg), SpO2 98%. Body mass index is 49.13 kg/m.  DIAGNOSTIC DATA REVIEWED:  BMET    Component Value Date/Time   NA 138 05/01/2023 1505   K 4.9 05/01/2023 1505   CL 101 05/01/2023 1505   CO2 24 05/01/2023 1505   GLUCOSE 81 05/01/2023 1505   GLUCOSE 81 02/17/2021 1257   BUN 18 05/01/2023 1505   CREATININE 0.87 05/01/2023 1505   CALCIUM 9.6 05/01/2023 1505   GFRNONAA 77 02/24/2018 1428   GFRAA 88 02/24/2018 1428   Lab Results  Component Value Date   HGBA1C 5.9 (H) 05/01/2023   HGBA1C 5.6 01/12/2017   Lab Results  Component Value Date   INSULIN 11.0 05/01/2023   Lab Results  Component Value Date   TSH 0.669 05/01/2023   CBC    Component Value Date/Time   WBC 8.9 05/01/2023 1505   WBC 8.7 02/17/2021 1257   RBC 4.69 05/01/2023 1505   RBC 4.79 02/17/2021 1257   HGB 12.4 05/01/2023 1505   HCT 37.9 05/01/2023 1505   PLT 412 05/01/2023 1505  MCV 81 05/01/2023 1505   MCH 26.4 (L) 05/01/2023 1505   MCH 27.1 12/16/2013 0932   MCHC 32.7 05/01/2023 1505   MCHC 33.5 02/17/2021 1257   RDW 13.6 05/01/2023 1505   Iron Studies    Component Value Date/Time   IRON 77 11/14/2022 1000   TIBC 287 11/14/2022 1000   FERRITIN 235 (H) 11/14/2022 1000   IRONPCTSAT 27 11/14/2022 1000   Lipid Panel     Component Value Date/Time   CHOL 232 (H) 05/01/2023 1505   TRIG 105 05/01/2023 1505   HDL 68 05/01/2023 1505   CHOLHDL 2.4 02/24/2018 1428   LDLCALC 146 (H) 05/01/2023 1505   Hepatic Function Panel     Component Value Date/Time   PROT 7.0  05/01/2023 1505   ALBUMIN 4.4 05/01/2023 1505   AST 19 05/01/2023 1505   ALT 22 05/01/2023 1505   ALKPHOS 84 05/01/2023 1505   BILITOT 0.4 05/01/2023 1505      Component Value Date/Time   TSH 0.669 05/01/2023 1505   Nutritional Lab Results  Component Value Date   VD25OH 61.4 05/01/2023   VD25OH 32.6 01/12/2017     Assessment and Plan    Obesity and binge eating episodes She has gained six pounds in the last two months and struggles with adherence to her weight management plan, following it about 50% of the time. She reports a resurgence of binge eating behaviors, particularly with sugar, attributed to emotional eating rather than hunger. Naltrexone is proposed for its dual benefit in addressing binge eating and aiding weight loss. Risks include nausea, especially at higher doses. It is part of Contrave, which also contains Wellbutrin, but Wellbutrin is excluded due to past adverse effects. Weight management is crucial for controlling hypertension. - Start naltrexone for binge eating and weight management - Refer to Dr. Dewaine Conger, a bariatric psychologist, for therapy focused on emotional eating - Continue current therapy - Follow up in four weeks  Depression She experiences emotional distress related to her eating habits, with feelings of self-loathing and obsession with food. She is currently on Prozac and receiving therapy. Additional support for emotional eating is suggested through referral to a bariatric psychologist. - Continue Prozac - Refer to Dr. Dewaine Conger for specialized therapy on emotional eating  Pre-Hypertension Home blood pressure readings are consistently higher (145 mmHg) compared to clinic readings (135 mmHg). The home cuff may be inaccurate, and verification against clinic equipment is recommended. She is concerned about the impact of her birth control on blood pressure. Weight management is emphasized for blood pressure control. - Bring home blood pressure cuff to  next primary care appointment for comparison with clinic readings - Discuss birth control and blood pressure management with primary care provider -Continue diet, exercise and weight loss to help treat elevated BP.       She was informed of the importance of frequent follow up visits to maximize her success with intensive lifestyle modifications for her multiple health conditions.    Quillian Quince, MD

## 2023-11-29 DIAGNOSIS — F902 Attention-deficit hyperactivity disorder, combined type: Secondary | ICD-10-CM | POA: Diagnosis not present

## 2023-11-29 DIAGNOSIS — F411 Generalized anxiety disorder: Secondary | ICD-10-CM | POA: Diagnosis not present

## 2023-12-04 DIAGNOSIS — G4733 Obstructive sleep apnea (adult) (pediatric): Secondary | ICD-10-CM | POA: Diagnosis not present

## 2023-12-12 ENCOUNTER — Telehealth (INDEPENDENT_AMBULATORY_CARE_PROVIDER_SITE_OTHER): Admitting: Psychology

## 2023-12-12 DIAGNOSIS — F89 Unspecified disorder of psychological development: Secondary | ICD-10-CM | POA: Diagnosis not present

## 2023-12-12 DIAGNOSIS — F419 Anxiety disorder, unspecified: Secondary | ICD-10-CM | POA: Diagnosis not present

## 2023-12-12 DIAGNOSIS — F5089 Other specified eating disorder: Secondary | ICD-10-CM | POA: Diagnosis not present

## 2023-12-12 DIAGNOSIS — F32A Depression, unspecified: Secondary | ICD-10-CM | POA: Diagnosis not present
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
## 2023-12-14 DIAGNOSIS — F902 Attention-deficit hyperactivity disorder, combined type: Secondary | ICD-10-CM | POA: Diagnosis not present

## 2023-12-14 DIAGNOSIS — F411 Generalized anxiety disorder: Secondary | ICD-10-CM | POA: Diagnosis not present

## 2023-12-17 ENCOUNTER — Encounter (INDEPENDENT_AMBULATORY_CARE_PROVIDER_SITE_OTHER): Payer: Self-pay | Admitting: Family Medicine

## 2023-12-18 ENCOUNTER — Telehealth (INDEPENDENT_AMBULATORY_CARE_PROVIDER_SITE_OTHER): Admitting: Psychology

## 2023-12-26 ENCOUNTER — Ambulatory Visit (INDEPENDENT_AMBULATORY_CARE_PROVIDER_SITE_OTHER): Payer: BC Managed Care – PPO | Admitting: Emergency Medicine

## 2023-12-26 ENCOUNTER — Encounter: Payer: Self-pay | Admitting: Emergency Medicine

## 2023-12-26 DIAGNOSIS — R7303 Prediabetes: Secondary | ICD-10-CM

## 2023-12-26 DIAGNOSIS — G894 Chronic pain syndrome: Secondary | ICD-10-CM | POA: Diagnosis not present

## 2023-12-26 DIAGNOSIS — E039 Hypothyroidism, unspecified: Secondary | ICD-10-CM | POA: Diagnosis not present

## 2023-12-26 DIAGNOSIS — G4733 Obstructive sleep apnea (adult) (pediatric): Secondary | ICD-10-CM | POA: Diagnosis not present

## 2023-12-26 DIAGNOSIS — F411 Generalized anxiety disorder: Secondary | ICD-10-CM

## 2023-12-26 MED ORDER — ONDANSETRON 4 MG PO TBDP: 4.0000 mg | ORAL_TABLET | Freq: Three times a day (TID) | ORAL | 2 refills | Status: AC | PRN

## 2023-12-26 NOTE — Assessment & Plan Note (Signed)
Clinically euthyroid.  Continue Synthroid 75 mcg daily. 

## 2023-12-26 NOTE — Assessment & Plan Note (Signed)
Cardiovascular risks associated with diabetes discussed Diet and nutrition discussed.

## 2023-12-26 NOTE — Assessment & Plan Note (Signed)
Stable.  On CPAP treatment. 

## 2023-12-26 NOTE — Progress Notes (Signed)
 Meagan [name] 48 y.o.   Chief Complaint  Patient presents with   Establish Care    Patient here to establish care, Her husband [name] also sees Syvanna Ciolino. She was seeing Tysinger, but personal knew him so wanted someone different. Patient is suppose to started naltrexone  but is nervous about getting sick and wanted to know if she can have something for nausea     HISTORY OF PRESENT ILLNESS: This is a 48 y.o. female first visit to this office, here to establish care with me Husband is a patient of mine Has some chronic medical conditions including borderline diabetes and hypothyroidism Goes to medical weight management on a regular basis They will be about to start naltrexone .  Requesting medication for nausea.  HPI   Prior to Admission medications   Medication Sig Start Date End Date Taking? Authorizing Provider  FLUoxetine  (PROZAC ) 10 MG tablet TAKE 1/2 TABLET BY MOUTH DAILY 04/29/22  Yes Benjiman Bras, MD  levothyroxine  (SYNTHROID ) 75 MCG tablet Take 75 mcg by mouth daily before breakfast.   Yes [provider]  meloxicam  (MOBIC ) 7.5 MG tablet TAKE 1 TABLET BY MOUTH EVERY DAY 01/21/22  Yes Benjiman Bras, MD  montelukast  (SINGULAIR ) 10 MG tablet TAKE 1 TABLET BY MOUTH EVERYDAY AT BEDTIME 06/01/21  Yes Benjiman Bras, MD  naltrexone  (DEPADE) 50 MG tablet Take 0.5 tablets (25 mg total) by mouth daily. 11/22/23  Yes Jasmine Mesi D, MD  VITAMIN D , CHOLECALCIFEROL, PO Take by mouth.   Yes [provider]  drospirenone-ethinyl estradiol (YAZ) 3-0.02 MG tablet 1 tablet daily. 05/07/23   [provider]    Allergies  Allergen Reactions   Cymbalta  [Duloxetine  Hcl]     intolerance   Wellbutrin  [Bupropion ] Nausea Only   Amoxicillin Itching and Rash    Patient Active Problem List   Diagnosis Date Noted   Other secondary hypertension 09/26/2023   Depression 06/13/2023   Prediabetes 05/16/2023   Vitamin D  deficiency 05/16/2023   Pure  hypercholesterolemia 05/16/2023   Other fatigue 05/01/2023   SOBOE (shortness of breath on exertion) 05/01/2023   Hyperglycemia 05/01/2023   Chronic pain syndrome 11/18/2022   Polyarthralgia 11/18/2022   Hypothyroidism 11/18/2022   Elevated uric acid in blood 11/18/2022   Positive ANA (antinuclear antibody) 04/07/2021   Low back pain 04/07/2021   Bilateral knee pain 03/17/2018   Seasonal allergic rhinitis 03/02/2018   Acquired hypothyroidism 03/02/2018   RLD (ruptured lumbar disc) 03/02/2018   Generalized anxiety disorder 09/03/2017   Morbid obesity (HCC) 09/03/2017   BMI 45.0-49.9, adult (HCC) 02/09/2015   OSA (obstructive sleep apnea) 02/09/2015   Hypersomnia, persistent 01/11/2013    Past Medical History:  Diagnosis Date   Acquired hypothyroidism 03/02/2018   Allergy    Seasonal   Anxiety    Chronic pain    Complication of anesthesia    post -op nausea and vomiting at age 21   Constipation, chronic    Depression    Family history of anesthesia complication    cousin woke-up during procedure   GERD (gastroesophageal reflux disease)    Narcolepsy    Other secondary hypertension 09/26/2023   Ruptured lumbar disc    L5   Sleep apnea    wears CPAP nightly   Sleep paralysis    at times   Uncontrolled narcolepsy 04/03/2013   Diagnosed 2002 , by Dr Rosa College. Meanwhile  Medications have become ineffective and patient gained 100 pounds, developed OSA, now on CPAP 9  cm, MSLT to follow in 04-19-13.    Vivid dream     Past Surgical History:  Procedure Laterality Date   CHOLECYSTECTOMY N/A 01/01/2014   Procedure: LAPAROSCOPIC CHOLECYSTECTOMY WITH INTRAOPERATIVE CHOLANGIOGRAM;  Surgeon: Kari Otto. Eli Grizzle, MD;  Location: [facility] OR;  Service: General;  Laterality: N/A;   TONSILLECTOMY AND ADENOIDECTOMY  1981   age 48   VAGINAL DELIVERY      Social History   Socioeconomic History   Marital status: Married    Spouse name: Merrillyn Ackerley   Number of children: 1   Years of  education: some col.   Highest education level: Associate degree: occupational, Scientist, product/process development, or vocational program  Occupational History    Employer: [employer]    Comment: [employer]  Tobacco Use   Smoking status: Former    Types: Cigarettes    Start date: 09/05/2001   Smokeless tobacco: Never   Tobacco comments:    10 years ago  Vaping Use   Vaping status: Never Used  Substance and Sexual Activity   Alcohol use: Yes    Comment: occasionally,one 3-7 oz of wine per week   Drug use: No   Sexual activity: Not Currently    Birth control/protection: Pill    Comment: kariva  Other Topics Concern   Not on file  Social History Narrative   Patient is single and lives at home, her daughter lives with her.   Patient has one child.   Patient is working full-time.   Patient has some college education.   Patient is left handed.   Patient does not drink any caffeine.   Social Drivers of Health   Financial Resource Strain: Medium Risk (12/22/2023)   Overall Financial Resource Strain (CARDIA)    Difficulty of Paying Living Expenses: Somewhat hard  Food Insecurity: No Food Insecurity (12/22/2023)   Hunger Vital Sign    Worried About Running Out of Food in the Last Year: Never true    Ran Out of Food in the Last Year: Never true  Transportation Needs: No Transportation Needs (12/22/2023)   PRAPARE - Administrator, Civil Service (Medical): No    Lack of Transportation (Non-Medical): No  Physical Activity: Unknown (12/22/2023)   Exercise Vital Sign    Days of Exercise per Week: 0 days    Minutes of Exercise per Session: Not on file  Stress: Stress Concern Present (12/22/2023)   [employer] of Occupational Health - Occupational Stress Questionnaire    Feeling of Stress : Very much  Social Connections: Socially Isolated (12/22/2023)   Social Connection and Isolation Panel [NHANES]    Frequency of Communication with Friends and Family: Never    Frequency of  Social Gatherings with Friends and Family: Once a week    Attends Religious Services: Never    Database administrator or Organizations: No    Attends Engineer, structural: Not on file    Marital Status: Married  Catering manager Violence: Not on file    Family History  Problem Relation Age of Onset   Alcohol abuse Mother    Depression Mother    Anxiety disorder Mother    Sleep apnea Mother    Alcoholism Mother    Diabetes Mother    Alcohol abuse Father    Alcoholism Father    Alcohol abuse Brother    Alcoholism Brother    Multiple sclerosis Maternal Grandmother    Breast cancer Paternal Grandmother    Healthy Daughter  Review of Systems  Constitutional: Negative.  Negative for chills and fever.  HENT: Negative.  Negative for congestion and sore throat.   Respiratory: Negative.  Negative for cough and shortness of breath.   Cardiovascular: Negative.  Negative for chest pain and palpitations.  Gastrointestinal:  Negative for abdominal pain, diarrhea, nausea and vomiting.  Genitourinary: Negative.  Negative for dysuria and hematuria.  Skin: Negative.  Negative for rash.  Neurological: Negative.  Negative for dizziness and headaches.  All other systems reviewed and are negative.   Vitals:   12/26/23 1112  BP: 122/78  Pulse: 83  Temp: 99 F (37.2 C)  SpO2: 95%    Physical Exam Vitals reviewed.  Constitutional:      Appearance: Normal appearance. She is obese.  HENT:     Head: Normocephalic.  Eyes:     Extraocular Movements: Extraocular movements intact.     Pupils: Pupils are equal, round, and reactive to light.  Cardiovascular:     Rate and Rhythm: Normal rate and regular rhythm.     Pulses: Normal pulses.     Heart sounds: Normal heart sounds.  Pulmonary:     Effort: Pulmonary effort is normal.     Breath sounds: Normal breath sounds.  Musculoskeletal:     Cervical back: No tenderness.  Lymphadenopathy:     Cervical: No cervical adenopathy.   Skin:    General: Skin is warm and dry.  Neurological:     Mental Status: She is alert and oriented to person, place, and time.  Psychiatric:        Mood and Affect: Mood normal.        Behavior: Behavior normal.      ASSESSMENT & PLAN: A total of 48 minutes was spent with the patient and counseling/coordination of care regarding preparing for this visit, review of available medical records, establishing care with me, review of multiple chronic medical conditions under management, review of all medications, review of most recent blood work results, education on nutrition, review of health maintenance items, prognosis, documentation and need for follow-up.  Problem List Items Addressed This Visit       Respiratory   OSA (obstructive sleep apnea)   Stable.  On CPAP treatment.        Endocrine   Acquired hypothyroidism   Clinically euthyroid. Continue Synthroid  75 mcg daily        Other   Generalized anxiety disorder   Sees psychiatrist on a regular basis Presently on Prozac  10 mg daily      Morbid obesity (HCC) - Primary   Goes to medical weight management on a regular basis Recently prescribed naltrexone  but not started yet Requesting prescription for Zofran  ODT New prescription sent to pharmacy of record today Diet and nutrition discussed Advised to decrease amount of daily carbohydrate intake and daily calories and increase amount of plant based protein in her diet Benefits of exercise discussed      Chronic pain syndrome   Was better controlled when she was on birth control pills. Sees gynecologist on a regular basis      Prediabetes   Cardiovascular risks associated with diabetes discussed Diet and nutrition discussed      Patient Instructions  Health Maintenance, Female Adopting a healthy lifestyle and getting preventive care are important in promoting health and wellness. Ask your health care provider about: The right schedule for you to have regular  tests and exams. Things you can do on your own to prevent diseases and  keep yourself healthy. What should I know about diet, weight, and exercise? Eat a healthy diet  Eat a diet that includes plenty of vegetables, fruits, low-fat dairy products, and lean protein. Do not eat a lot of foods that are high in solid fats, added sugars, or sodium. Maintain a healthy weight Body mass index (BMI) is used to identify weight problems. It estimates body fat based on height and weight. Your health care provider can help determine your BMI and help you achieve or maintain a healthy weight. Get regular exercise Get regular exercise. This is one of the most important things you can do for your health. Most adults should: Exercise for at least 150 minutes each week. The exercise should increase your heart rate and make you sweat (moderate-intensity exercise). Do strengthening exercises at least twice a week. This is in addition to the moderate-intensity exercise. Spend less time sitting. Even light physical activity can be beneficial. Watch cholesterol and blood lipids Have your blood tested for lipids and cholesterol at 49 years of age, then have this test every 5 years. Have your cholesterol levels checked more often if: Your lipid or cholesterol levels are high. You are older than 49 years of age. You are at high risk for heart disease. What should I know about cancer screening? Depending on your health history and family history, you may need to have cancer screening at various ages. This may include screening for: Breast cancer. Cervical cancer. Colorectal cancer. Skin cancer. Lung cancer. What should I know about heart disease, diabetes, and high blood pressure? Blood pressure and heart disease High blood pressure causes heart disease and increases the risk of stroke. This is more likely to develop in people who have high blood pressure readings or are overweight. Have your blood pressure  checked: Every 3-5 years if you are 28-69 years of age. Every year if you are 84 years old or older. Diabetes Have regular diabetes screenings. This checks your fasting blood sugar level. Have the screening done: Once every three years after age 18 if you are at a normal weight and have a low risk for diabetes. More often and at a younger age if you are overweight or have a high risk for diabetes. What should I know about preventing infection? Hepatitis B If you have a higher risk for hepatitis B, you should be screened for this virus. Talk with your health care provider to find out if you are at risk for hepatitis B infection. Hepatitis C Testing is recommended for: Everyone born from [birth year] through 1966. Anyone with known risk factors for hepatitis C. Sexually transmitted infections (STIs) Get screened for STIs, including gonorrhea and chlamydia, if: You are sexually active and are younger than 49 years of age. You are older than 49 years of age and your health care provider tells you that you are at risk for this type of infection. Your sexual activity has changed since you were last screened, and you are at increased risk for chlamydia or gonorrhea. Ask your health care provider if you are at risk. Ask your health care provider about whether you are at high risk for HIV. Your health care provider may recommend a prescription medicine to help prevent HIV infection. If you choose to take medicine to prevent HIV, you should first get tested for HIV. You should then be tested every 3 months for as long as you are taking the medicine. Pregnancy If you are about to stop having your period (premenopausal) and  you may become pregnant, seek counseling before you get pregnant. Take 400 to 800 micrograms (mcg) of folic acid every day if you become pregnant. Ask for birth control (contraception) if you want to prevent pregnancy. Osteoporosis and menopause Osteoporosis is a disease in which the bones  lose minerals and strength with aging. This can result in bone fractures. If you are 108 years old or older, or if you are at risk for osteoporosis and fractures, ask your health care provider if you should: Be screened for bone loss. Take a calcium or vitamin D  supplement to lower your risk of fractures. Be given hormone replacement therapy (HRT) to treat symptoms of menopause. Follow these instructions at home: Alcohol use Do not drink alcohol if: Your health care provider tells you not to drink. You are pregnant, may be pregnant, or are planning to become pregnant. If you drink alcohol: Limit how much you have to: 0-1 drink a day. Know how much alcohol is in your drink. In the [place] one drink equals one 12 oz bottle of beer (355 mL), one 5 oz glass of wine (148 mL), or one 1 oz glass of hard liquor (44 mL). Lifestyle Do not use any products that contain nicotine or tobacco. These products include cigarettes, chewing tobacco, and vaping devices, such as e-cigarettes. If you need help quitting, ask your health care provider. Do not use street drugs. Do not share needles. Ask your health care provider for help if you need support or information about quitting drugs. General instructions Schedule regular health, dental, and eye exams. Stay current with your vaccines. Tell your health care provider if: You often feel depressed. You have ever been abused or do not feel safe at home. Summary Adopting a healthy lifestyle and getting preventive care are important in promoting health and wellness. Follow your health care provider's instructions about healthy diet, exercising, and getting tested or screened for diseases. Follow your health care provider's instructions on monitoring your cholesterol and blood pressure. This information is not intended to replace advice given to you by your health care provider. Make sure you discuss any questions you have with your health care provider. Document  Revised: 01/11/2021 Document Reviewed: 01/11/2021 Elsevier Patient Education  2024 Elsevier Inc.       Maryagnes Small, MD [facility]

## 2023-12-26 NOTE — Assessment & Plan Note (Signed)
 Was better controlled when she was on birth control pills. Sees gynecologist on a regular basis

## 2023-12-26 NOTE — Assessment & Plan Note (Addendum)
 Goes to medical weight management on a regular basis Recently prescribed naltrexone  but not started yet Requesting prescription for Zofran  ODT New prescription sent to pharmacy of record today Diet and nutrition discussed Advised to decrease amount of daily carbohydrate intake and daily calories and increase amount of plant based protein in her diet Benefits of exercise discussed

## 2023-12-26 NOTE — Patient Instructions (Signed)

## 2023-12-26 NOTE — Assessment & Plan Note (Signed)
 Sees psychiatrist on a regular basis Presently on Prozac  10 mg daily

## 2023-12-28 DIAGNOSIS — F411 Generalized anxiety disorder: Secondary | ICD-10-CM | POA: Diagnosis not present

## 2023-12-28 DIAGNOSIS — F902 Attention-deficit hyperactivity disorder, combined type: Secondary | ICD-10-CM | POA: Diagnosis not present

## 2024-01-01 ENCOUNTER — Ambulatory Visit (INDEPENDENT_AMBULATORY_CARE_PROVIDER_SITE_OTHER): Admitting: Family Medicine

## 2024-01-25 DIAGNOSIS — F902 Attention-deficit hyperactivity disorder, combined type: Secondary | ICD-10-CM | POA: Diagnosis not present

## 2024-01-25 DIAGNOSIS — F411 Generalized anxiety disorder: Secondary | ICD-10-CM | POA: Diagnosis not present

## 2024-02-22 DIAGNOSIS — F902 Attention-deficit hyperactivity disorder, combined type: Secondary | ICD-10-CM | POA: Diagnosis not present

## 2024-02-22 DIAGNOSIS — F411 Generalized anxiety disorder: Secondary | ICD-10-CM | POA: Diagnosis not present

## 2024-02-29 ENCOUNTER — Ambulatory Visit (INDEPENDENT_AMBULATORY_CARE_PROVIDER_SITE_OTHER): Admitting: Family Medicine

## 2024-02-29 ENCOUNTER — Encounter (INDEPENDENT_AMBULATORY_CARE_PROVIDER_SITE_OTHER): Payer: Self-pay | Admitting: Family Medicine

## 2024-02-29 VITALS — BP 119/80 | HR 93 | Temp 98.6°F | Ht 61.0 in | Wt 249.0 lb

## 2024-02-29 DIAGNOSIS — E669 Obesity, unspecified: Secondary | ICD-10-CM

## 2024-02-29 DIAGNOSIS — Z789 Other specified health status: Secondary | ICD-10-CM | POA: Diagnosis not present

## 2024-02-29 DIAGNOSIS — Z6841 Body Mass Index (BMI) 40.0 and over, adult: Secondary | ICD-10-CM | POA: Diagnosis not present

## 2024-02-29 DIAGNOSIS — R030 Elevated blood-pressure reading, without diagnosis of hypertension: Secondary | ICD-10-CM

## 2024-02-29 NOTE — Progress Notes (Signed)
 Office: [phone]  /  Fax: [phone]  WEIGHT SUMMARY AND BIOMETRICS  Anthropometric Measurements Height: 5' 1 (1.549 m) Weight: 249 lb (112.9 kg) BMI (Calculated): 47.07 Weight at Last Visit: 260 lb Weight Lost Since Last Visit: 11 lb Weight Gained Since Last Visit: 0 Starting Weight: 258 lb Total Weight Loss (lbs): 9 lb (4.082 kg)   Body Composition  Body Fat %: 49.1 % Fat Mass (lbs): 122.4 lbs Muscle Mass (lbs): 120.4 lbs Total Body Water (lbs): 85.8 lbs Visceral Fat Rating : 17   Other Clinical Data Fasting: No Labs: No Today's Visit #: 10 Starting Date: 05/01/23    Chief Complaint: OBESITY    History of Present Illness Meagan [name] is a 48 year old female who presents for obesity treatment.  She has lost eleven pounds over the last three months without following a strict eating plan or exercising regularly. She attributes her weight loss to dietary changes, including a 'dirty keto' diet, which involves consuming less than fifty carbs a day and ensuring each meal includes a protein, a fat, and a vegetable. She incorporates berries and dark chocolate for sweetness and enjoys Yasso bars as a nightly treat. She consumes about 100 grams of protein daily, aided by protein shakes, and maintains a calorie intake of 1300 per meal.  Her work schedule, from eight to five-thirty, has made it difficult to attend doctor's appointments and maintain a consistent health routine. She is considering a potential new job that may alleviate some of her stress and allow for better health management. She describes her current job as stressful and feels it negatively impacts her health.  Regarding her blood pressure, it has improved since switching from a birth control that raised her blood pressure to a low estrogen pill, which has also helped with her joint pain. She previously experienced prehypertension with readings of 135/80, and her blood pressure has improved since the  medication change.  She has experienced nausea with a previous medication and was prescribed anti-nausea medication, which caused constipation. She is frustrated with the side effects of medications and is cautious about trying new ones due to her sensitivity to side effects.      PHYSICAL EXAM:  Blood pressure 119/80, pulse 93, temperature 98.6 F (37 C), height 5' 1 (1.549 m), weight 249 lb (112.9 kg), SpO2 98%. Body mass index is 47.05 kg/m.  DIAGNOSTIC DATA REVIEWED:  BMET    Component Value Date/Time   NA 138 05/01/2023 1505   K 4.9 05/01/2023 1505   CL 101 05/01/2023 1505   CO2 24 05/01/2023 1505   GLUCOSE 81 05/01/2023 1505   GLUCOSE 81 02/17/2021 1257   BUN 18 05/01/2023 1505   CREATININE 0.87 05/01/2023 1505   CALCIUM 9.6 05/01/2023 1505   GFRNONAA 77 02/24/2018 1428   GFRAA 88 02/24/2018 1428   Lab Results  Component Value Date   HGBA1C 5.9 (H) 05/01/2023   HGBA1C 5.6 01/12/2017   Lab Results  Component Value Date   INSULIN  11.0 05/01/2023   Lab Results  Component Value Date   TSH 0.669 05/01/2023   CBC    Component Value Date/Time   WBC 8.9 05/01/2023 1505   WBC 8.7 02/17/2021 1257   RBC 4.69 05/01/2023 1505   RBC 4.79 02/17/2021 1257   HGB 12.4 05/01/2023 1505   HCT 37.9 05/01/2023 1505   PLT 412 05/01/2023 1505   MCV 81 05/01/2023 1505   MCH 26.4 (L) 05/01/2023 1505   MCH 27.1 12/16/2013  0932   MCHC 32.7 05/01/2023 1505   MCHC 33.5 02/17/2021 1257   RDW 13.6 05/01/2023 1505   Iron Studies    Component Value Date/Time   IRON 77 11/14/2022 1000   TIBC 287 11/14/2022 1000   FERRITIN 235 (H) 11/14/2022 1000   IRONPCTSAT 27 11/14/2022 1000   Lipid Panel     Component Value Date/Time   CHOL 232 (H) 05/01/2023 1505   TRIG 105 05/01/2023 1505   HDL 68 05/01/2023 1505   CHOLHDL 2.4 02/24/2018 1428   LDLCALC 146 (H) 05/01/2023 1505   Hepatic Function Panel     Component Value Date/Time   PROT 7.0 05/01/2023 1505   ALBUMIN 4.4  05/01/2023 1505   AST 19 05/01/2023 1505   ALT 22 05/01/2023 1505   ALKPHOS 84 05/01/2023 1505   BILITOT 0.4 05/01/2023 1505      Component Value Date/Time   TSH 0.669 05/01/2023 1505   Nutritional Lab Results  Component Value Date   VD25OH 61.4 05/01/2023   VD25OH 32.6 01/12/2017     Assessment and Plan Assessment & Plan Obesity Obesity management is the primary focus. She has lost 11 pounds in the last three months despite not strictly adhering to an eating plan or exercising regularly. She follows a modified keto diet ('dirty keto') with 1300 calories per meal, less than 50 grams of carbohydrates daily, and at least 85 grams of protein. She uses protein shakes to meet protein goals and includes a variety of vegetables. She practices portion control and makes healthier dining choices. She is hesitant to try medications like Zepbound due to potential side effects, preferring lifestyle modifications. Encouragement is given for continued dietary management and the addition of strengthening activities to maintain muscle mass. - Continue current dietary plan with focus on portion control and smart choices - Incorporate strengthening activities to maintain muscle mass  Prehypertension Prehypertension has resolved. Previously, a birth control elevated her blood pressure to 135/80 mmHg. Switching to a low estrogen pill improved her blood pressure and joint pain. - Monitor blood pressure at next visit  Medication Intolerance She experiences nausea with many medications, including the last prescribed, naltrexone . Anti-nausea medication causes constipation. She is hesitant to try new medications due to side effects, managing conditions through lifestyle modifications. - Discontinue current medication causing nausea - Avoid prescribing new medications with potential for nausea including GLP-1s for weight loss  General Health Maintenance Advised to maintain hydration, especially after camping  trips, and avoid excessive heat exposure. Emphasized setting boundaries in her caregiving role to prevent stress-related health issues. - Encourage hydration and avoidance of excessive heat - Advise on setting personal boundaries to manage stress      She was informed of the importance of frequent follow up visits to maximize her success with intensive lifestyle modifications for her multiple health conditions.    Louann Penton, MD

## 2024-03-01 ENCOUNTER — Other Ambulatory Visit: Payer: Self-pay | Admitting: Obstetrics and Gynecology

## 2024-03-01 DIAGNOSIS — Z1231 Encounter for screening mammogram for malignant neoplasm of breast: Secondary | ICD-10-CM

## 2024-03-07 DIAGNOSIS — F411 Generalized anxiety disorder: Secondary | ICD-10-CM | POA: Diagnosis not present

## 2024-03-07 DIAGNOSIS — F902 Attention-deficit hyperactivity disorder, combined type: Secondary | ICD-10-CM | POA: Diagnosis not present

## 2024-03-12 DIAGNOSIS — M545 Low back pain, unspecified: Secondary | ICD-10-CM | POA: Diagnosis not present

## 2024-03-13 ENCOUNTER — Encounter: Payer: Self-pay | Admitting: Emergency Medicine

## 2024-03-13 ENCOUNTER — Other Ambulatory Visit: Payer: Self-pay | Admitting: Radiology

## 2024-03-13 DIAGNOSIS — G894 Chronic pain syndrome: Secondary | ICD-10-CM

## 2024-03-13 DIAGNOSIS — M5441 Lumbago with sciatica, right side: Secondary | ICD-10-CM

## 2024-03-13 NOTE — Telephone Encounter (Signed)
 Refer to orthopedics.  Thanks.

## 2024-03-15 ENCOUNTER — Other Ambulatory Visit: Payer: Self-pay | Admitting: Radiology

## 2024-03-15 DIAGNOSIS — G894 Chronic pain syndrome: Secondary | ICD-10-CM

## 2024-03-20 DIAGNOSIS — M41126 Adolescent idiopathic scoliosis, lumbar region: Secondary | ICD-10-CM | POA: Diagnosis not present

## 2024-03-20 DIAGNOSIS — M25572 Pain in left ankle and joints of left foot: Secondary | ICD-10-CM | POA: Diagnosis not present

## 2024-03-20 DIAGNOSIS — G4733 Obstructive sleep apnea (adult) (pediatric): Secondary | ICD-10-CM | POA: Diagnosis not present

## 2024-03-20 DIAGNOSIS — M25571 Pain in right ankle and joints of right foot: Secondary | ICD-10-CM | POA: Diagnosis not present

## 2024-03-21 DIAGNOSIS — F411 Generalized anxiety disorder: Secondary | ICD-10-CM | POA: Diagnosis not present

## 2024-03-21 DIAGNOSIS — F902 Attention-deficit hyperactivity disorder, combined type: Secondary | ICD-10-CM | POA: Diagnosis not present

## 2024-03-27 DIAGNOSIS — M25572 Pain in left ankle and joints of left foot: Secondary | ICD-10-CM | POA: Diagnosis not present

## 2024-03-27 DIAGNOSIS — M9902 Segmental and somatic dysfunction of thoracic region: Secondary | ICD-10-CM | POA: Diagnosis not present

## 2024-03-27 DIAGNOSIS — M9905 Segmental and somatic dysfunction of pelvic region: Secondary | ICD-10-CM | POA: Diagnosis not present

## 2024-03-27 DIAGNOSIS — M5442 Lumbago with sciatica, left side: Secondary | ICD-10-CM | POA: Diagnosis not present

## 2024-03-27 DIAGNOSIS — M25571 Pain in right ankle and joints of right foot: Secondary | ICD-10-CM | POA: Diagnosis not present

## 2024-03-27 DIAGNOSIS — M9904 Segmental and somatic dysfunction of sacral region: Secondary | ICD-10-CM | POA: Diagnosis not present

## 2024-03-27 DIAGNOSIS — M9906 Segmental and somatic dysfunction of lower extremity: Secondary | ICD-10-CM | POA: Diagnosis not present

## 2024-03-28 DIAGNOSIS — M9902 Segmental and somatic dysfunction of thoracic region: Secondary | ICD-10-CM | POA: Diagnosis not present

## 2024-03-28 DIAGNOSIS — M25572 Pain in left ankle and joints of left foot: Secondary | ICD-10-CM | POA: Diagnosis not present

## 2024-03-28 DIAGNOSIS — M9904 Segmental and somatic dysfunction of sacral region: Secondary | ICD-10-CM | POA: Diagnosis not present

## 2024-03-28 DIAGNOSIS — M9906 Segmental and somatic dysfunction of lower extremity: Secondary | ICD-10-CM | POA: Diagnosis not present

## 2024-03-28 DIAGNOSIS — M9905 Segmental and somatic dysfunction of pelvic region: Secondary | ICD-10-CM | POA: Diagnosis not present

## 2024-03-28 DIAGNOSIS — M25571 Pain in right ankle and joints of right foot: Secondary | ICD-10-CM | POA: Diagnosis not present

## 2024-03-28 DIAGNOSIS — M5442 Lumbago with sciatica, left side: Secondary | ICD-10-CM | POA: Diagnosis not present

## 2024-04-02 DIAGNOSIS — M25572 Pain in left ankle and joints of left foot: Secondary | ICD-10-CM | POA: Diagnosis not present

## 2024-04-02 DIAGNOSIS — M25571 Pain in right ankle and joints of right foot: Secondary | ICD-10-CM | POA: Diagnosis not present

## 2024-04-02 DIAGNOSIS — M9905 Segmental and somatic dysfunction of pelvic region: Secondary | ICD-10-CM | POA: Diagnosis not present

## 2024-04-02 DIAGNOSIS — M5442 Lumbago with sciatica, left side: Secondary | ICD-10-CM | POA: Diagnosis not present

## 2024-04-02 DIAGNOSIS — M9904 Segmental and somatic dysfunction of sacral region: Secondary | ICD-10-CM | POA: Diagnosis not present

## 2024-04-02 DIAGNOSIS — M9902 Segmental and somatic dysfunction of thoracic region: Secondary | ICD-10-CM | POA: Diagnosis not present

## 2024-04-02 DIAGNOSIS — M9906 Segmental and somatic dysfunction of lower extremity: Secondary | ICD-10-CM | POA: Diagnosis not present

## 2024-04-04 DIAGNOSIS — M9905 Segmental and somatic dysfunction of pelvic region: Secondary | ICD-10-CM | POA: Diagnosis not present

## 2024-04-04 DIAGNOSIS — F902 Attention-deficit hyperactivity disorder, combined type: Secondary | ICD-10-CM | POA: Diagnosis not present

## 2024-04-04 DIAGNOSIS — M9903 Segmental and somatic dysfunction of lumbar region: Secondary | ICD-10-CM | POA: Diagnosis not present

## 2024-04-04 DIAGNOSIS — F411 Generalized anxiety disorder: Secondary | ICD-10-CM | POA: Diagnosis not present

## 2024-04-04 DIAGNOSIS — M9906 Segmental and somatic dysfunction of lower extremity: Secondary | ICD-10-CM | POA: Diagnosis not present

## 2024-04-04 DIAGNOSIS — M546 Pain in thoracic spine: Secondary | ICD-10-CM | POA: Diagnosis not present

## 2024-04-04 DIAGNOSIS — M25571 Pain in right ankle and joints of right foot: Secondary | ICD-10-CM | POA: Diagnosis not present

## 2024-04-04 DIAGNOSIS — M9902 Segmental and somatic dysfunction of thoracic region: Secondary | ICD-10-CM | POA: Diagnosis not present

## 2024-04-04 DIAGNOSIS — M9904 Segmental and somatic dysfunction of sacral region: Secondary | ICD-10-CM | POA: Diagnosis not present

## 2024-04-04 DIAGNOSIS — M25572 Pain in left ankle and joints of left foot: Secondary | ICD-10-CM | POA: Diagnosis not present

## 2024-04-08 DIAGNOSIS — M9903 Segmental and somatic dysfunction of lumbar region: Secondary | ICD-10-CM | POA: Diagnosis not present

## 2024-04-08 DIAGNOSIS — M9906 Segmental and somatic dysfunction of lower extremity: Secondary | ICD-10-CM | POA: Diagnosis not present

## 2024-04-08 DIAGNOSIS — M25571 Pain in right ankle and joints of right foot: Secondary | ICD-10-CM | POA: Diagnosis not present

## 2024-04-08 DIAGNOSIS — M25572 Pain in left ankle and joints of left foot: Secondary | ICD-10-CM | POA: Diagnosis not present

## 2024-04-08 DIAGNOSIS — M9904 Segmental and somatic dysfunction of sacral region: Secondary | ICD-10-CM | POA: Diagnosis not present

## 2024-04-08 DIAGNOSIS — M9905 Segmental and somatic dysfunction of pelvic region: Secondary | ICD-10-CM | POA: Diagnosis not present

## 2024-04-08 DIAGNOSIS — M546 Pain in thoracic spine: Secondary | ICD-10-CM | POA: Diagnosis not present

## 2024-04-08 DIAGNOSIS — M9902 Segmental and somatic dysfunction of thoracic region: Secondary | ICD-10-CM | POA: Diagnosis not present

## 2024-04-10 DIAGNOSIS — M546 Pain in thoracic spine: Secondary | ICD-10-CM | POA: Diagnosis not present

## 2024-04-10 DIAGNOSIS — M9902 Segmental and somatic dysfunction of thoracic region: Secondary | ICD-10-CM | POA: Diagnosis not present

## 2024-04-15 DIAGNOSIS — M9906 Segmental and somatic dysfunction of lower extremity: Secondary | ICD-10-CM | POA: Diagnosis not present

## 2024-04-15 DIAGNOSIS — M25571 Pain in right ankle and joints of right foot: Secondary | ICD-10-CM | POA: Diagnosis not present

## 2024-04-15 DIAGNOSIS — M9904 Segmental and somatic dysfunction of sacral region: Secondary | ICD-10-CM | POA: Diagnosis not present

## 2024-04-15 DIAGNOSIS — M25572 Pain in left ankle and joints of left foot: Secondary | ICD-10-CM | POA: Diagnosis not present

## 2024-04-15 DIAGNOSIS — M9902 Segmental and somatic dysfunction of thoracic region: Secondary | ICD-10-CM | POA: Diagnosis not present

## 2024-04-15 DIAGNOSIS — M9905 Segmental and somatic dysfunction of pelvic region: Secondary | ICD-10-CM | POA: Diagnosis not present

## 2024-04-15 DIAGNOSIS — M9903 Segmental and somatic dysfunction of lumbar region: Secondary | ICD-10-CM | POA: Diagnosis not present

## 2024-04-18 DIAGNOSIS — M9902 Segmental and somatic dysfunction of thoracic region: Secondary | ICD-10-CM | POA: Diagnosis not present

## 2024-04-18 DIAGNOSIS — M25572 Pain in left ankle and joints of left foot: Secondary | ICD-10-CM | POA: Diagnosis not present

## 2024-04-18 DIAGNOSIS — M9906 Segmental and somatic dysfunction of lower extremity: Secondary | ICD-10-CM | POA: Diagnosis not present

## 2024-04-18 DIAGNOSIS — F411 Generalized anxiety disorder: Secondary | ICD-10-CM | POA: Diagnosis not present

## 2024-04-18 DIAGNOSIS — F902 Attention-deficit hyperactivity disorder, combined type: Secondary | ICD-10-CM | POA: Diagnosis not present

## 2024-04-18 DIAGNOSIS — M25571 Pain in right ankle and joints of right foot: Secondary | ICD-10-CM | POA: Diagnosis not present

## 2024-04-18 DIAGNOSIS — M9905 Segmental and somatic dysfunction of pelvic region: Secondary | ICD-10-CM | POA: Diagnosis not present

## 2024-04-18 DIAGNOSIS — M9904 Segmental and somatic dysfunction of sacral region: Secondary | ICD-10-CM | POA: Diagnosis not present

## 2024-04-18 DIAGNOSIS — M9903 Segmental and somatic dysfunction of lumbar region: Secondary | ICD-10-CM | POA: Diagnosis not present

## 2024-04-23 DIAGNOSIS — M9902 Segmental and somatic dysfunction of thoracic region: Secondary | ICD-10-CM | POA: Diagnosis not present

## 2024-04-23 DIAGNOSIS — M25571 Pain in right ankle and joints of right foot: Secondary | ICD-10-CM | POA: Diagnosis not present

## 2024-04-23 DIAGNOSIS — M9906 Segmental and somatic dysfunction of lower extremity: Secondary | ICD-10-CM | POA: Diagnosis not present

## 2024-04-23 DIAGNOSIS — M9903 Segmental and somatic dysfunction of lumbar region: Secondary | ICD-10-CM | POA: Diagnosis not present

## 2024-04-23 DIAGNOSIS — M9904 Segmental and somatic dysfunction of sacral region: Secondary | ICD-10-CM | POA: Diagnosis not present

## 2024-04-23 DIAGNOSIS — M25572 Pain in left ankle and joints of left foot: Secondary | ICD-10-CM | POA: Diagnosis not present

## 2024-04-23 DIAGNOSIS — M9905 Segmental and somatic dysfunction of pelvic region: Secondary | ICD-10-CM | POA: Diagnosis not present

## 2024-04-29 ENCOUNTER — Encounter: Payer: Self-pay | Admitting: Emergency Medicine

## 2024-04-30 DIAGNOSIS — M9906 Segmental and somatic dysfunction of lower extremity: Secondary | ICD-10-CM | POA: Diagnosis not present

## 2024-04-30 DIAGNOSIS — M9903 Segmental and somatic dysfunction of lumbar region: Secondary | ICD-10-CM | POA: Diagnosis not present

## 2024-04-30 DIAGNOSIS — M25572 Pain in left ankle and joints of left foot: Secondary | ICD-10-CM | POA: Diagnosis not present

## 2024-04-30 DIAGNOSIS — M25571 Pain in right ankle and joints of right foot: Secondary | ICD-10-CM | POA: Diagnosis not present

## 2024-04-30 DIAGNOSIS — M5442 Lumbago with sciatica, left side: Secondary | ICD-10-CM | POA: Diagnosis not present

## 2024-04-30 NOTE — Telephone Encounter (Signed)
 The answer to her question is very simple.  She needs to be seen.  Recommend office visit.

## 2024-04-30 NOTE — Telephone Encounter (Signed)
 Copied from CRM #[id number]. Topic: Clinical - Medical Advice >> Apr 29, 2024  5:01 PM Meagan [name] wrote: Reason for CRM: Pt called in saying that she's been having symptoms that can also mimic heart issues. Wants to know if she should come in for lab work to make sure nothing is going on.

## 2024-05-02 DIAGNOSIS — R87810 Cervical high risk human papillomavirus (HPV) DNA test positive: Secondary | ICD-10-CM | POA: Diagnosis not present

## 2024-05-02 DIAGNOSIS — M546 Pain in thoracic spine: Secondary | ICD-10-CM | POA: Diagnosis not present

## 2024-05-02 DIAGNOSIS — Z01419 Encounter for gynecological examination (general) (routine) without abnormal findings: Secondary | ICD-10-CM | POA: Diagnosis not present

## 2024-05-02 DIAGNOSIS — N926 Irregular menstruation, unspecified: Secondary | ICD-10-CM | POA: Diagnosis not present

## 2024-05-02 DIAGNOSIS — M9903 Segmental and somatic dysfunction of lumbar region: Secondary | ICD-10-CM | POA: Diagnosis not present

## 2024-05-02 DIAGNOSIS — Z1330 Encounter for screening examination for mental health and behavioral disorders, unspecified: Secondary | ICD-10-CM | POA: Diagnosis not present

## 2024-05-02 DIAGNOSIS — M9904 Segmental and somatic dysfunction of sacral region: Secondary | ICD-10-CM | POA: Diagnosis not present

## 2024-05-02 DIAGNOSIS — M9902 Segmental and somatic dysfunction of thoracic region: Secondary | ICD-10-CM | POA: Diagnosis not present

## 2024-05-02 DIAGNOSIS — M9905 Segmental and somatic dysfunction of pelvic region: Secondary | ICD-10-CM | POA: Diagnosis not present

## 2024-05-07 ENCOUNTER — Ambulatory Visit

## 2024-05-07 DIAGNOSIS — M9904 Segmental and somatic dysfunction of sacral region: Secondary | ICD-10-CM | POA: Diagnosis not present

## 2024-05-07 DIAGNOSIS — M546 Pain in thoracic spine: Secondary | ICD-10-CM | POA: Diagnosis not present

## 2024-05-07 DIAGNOSIS — M9902 Segmental and somatic dysfunction of thoracic region: Secondary | ICD-10-CM | POA: Diagnosis not present

## 2024-05-07 DIAGNOSIS — M25571 Pain in right ankle and joints of right foot: Secondary | ICD-10-CM | POA: Diagnosis not present

## 2024-05-07 DIAGNOSIS — M25572 Pain in left ankle and joints of left foot: Secondary | ICD-10-CM | POA: Diagnosis not present

## 2024-05-07 DIAGNOSIS — M9906 Segmental and somatic dysfunction of lower extremity: Secondary | ICD-10-CM | POA: Diagnosis not present

## 2024-05-07 DIAGNOSIS — M9905 Segmental and somatic dysfunction of pelvic region: Secondary | ICD-10-CM | POA: Diagnosis not present

## 2024-05-07 DIAGNOSIS — M9903 Segmental and somatic dysfunction of lumbar region: Secondary | ICD-10-CM | POA: Diagnosis not present

## 2024-05-09 DIAGNOSIS — F902 Attention-deficit hyperactivity disorder, combined type: Secondary | ICD-10-CM | POA: Diagnosis not present

## 2024-05-09 DIAGNOSIS — F411 Generalized anxiety disorder: Secondary | ICD-10-CM | POA: Diagnosis not present

## 2024-05-13 DIAGNOSIS — M9903 Segmental and somatic dysfunction of lumbar region: Secondary | ICD-10-CM | POA: Diagnosis not present

## 2024-05-13 DIAGNOSIS — M9904 Segmental and somatic dysfunction of sacral region: Secondary | ICD-10-CM | POA: Diagnosis not present

## 2024-05-13 DIAGNOSIS — M9902 Segmental and somatic dysfunction of thoracic region: Secondary | ICD-10-CM | POA: Diagnosis not present

## 2024-05-13 DIAGNOSIS — M9906 Segmental and somatic dysfunction of lower extremity: Secondary | ICD-10-CM | POA: Diagnosis not present

## 2024-05-13 DIAGNOSIS — M9905 Segmental and somatic dysfunction of pelvic region: Secondary | ICD-10-CM | POA: Diagnosis not present

## 2024-05-13 DIAGNOSIS — M25572 Pain in left ankle and joints of left foot: Secondary | ICD-10-CM | POA: Diagnosis not present

## 2024-05-13 DIAGNOSIS — M546 Pain in thoracic spine: Secondary | ICD-10-CM | POA: Diagnosis not present

## 2024-05-13 DIAGNOSIS — M25571 Pain in right ankle and joints of right foot: Secondary | ICD-10-CM | POA: Diagnosis not present

## 2024-05-16 ENCOUNTER — Encounter: Payer: Self-pay | Admitting: Neurology

## 2024-05-16 ENCOUNTER — Ambulatory Visit: Admitting: Neurology

## 2024-05-16 VITALS — BP 140/78 | HR 86 | Ht 60.0 in | Wt 262.6 lb

## 2024-05-16 DIAGNOSIS — G4733 Obstructive sleep apnea (adult) (pediatric): Secondary | ICD-10-CM

## 2024-05-16 DIAGNOSIS — F411 Generalized anxiety disorder: Secondary | ICD-10-CM | POA: Diagnosis not present

## 2024-05-16 DIAGNOSIS — E66813 Obesity, class 3: Secondary | ICD-10-CM

## 2024-05-16 DIAGNOSIS — Z6841 Body Mass Index (BMI) 40.0 and over, adult: Secondary | ICD-10-CM

## 2024-05-16 DIAGNOSIS — G2581 Restless legs syndrome: Secondary | ICD-10-CM

## 2024-05-16 MED ORDER — MAGNESIUM GLUCONATE 250 MG PO TABS: 250.0000 mg | ORAL_TABLET | Freq: Two times a day (BID) | ORAL | Status: AC

## 2024-05-16 NOTE — Patient Instructions (Signed)
 48 y.o. year old female  here with:     1) RLS -the patient husband has jokingly called her a marathon runner but being seated or in an otherwise relaxed position.  This can happen on a plane ride this can happen by seated and seated in the car or here being in an exam room chair.   Getting up and walking completely takes the urge to move away.  She does not describe creepy crawly sensations but she describes the irresistible urge to move that she cannot fight. It does not keep her form sleeping, does not wake her up- its not related to sleep at all.  She does not have signs of neuropathy.  She does have very brisk reflexes so. Lets look at her iron levels and consider her prediabetic state as additional risk factors.  Last ferritin was 235 in March 2024.        2) the patient is a compliant user of CPAP for her treatment of obstructive sleep apnea, the resolution of apnea is excellent.  She does wonder if her main risk factor is her BMI and now exceeds 51 kg/m2 would make her a candidate for GLP-1 medication.    Her April 2024 HST showed a BMI of 48.4  kg/m2.  Loud snoring     I know that Dr. Verdon is trying to obtain these medications for her patient but we may be able to help Mrs. Baughman by documenting the level of baseline sleep apnea.   This may up help her to obtain Zepbound as an FDA approved medication for moderate to severe sleep apnea    3) low back pain and sciatica can explain the brisk reflexes.  Chiropractor  is treating her.  Can benefit from more exercises , core strength and yoga to be limber.      RV in 12 months, CC to Dr Verdon, Dr Purcell.

## 2024-05-16 NOTE — Progress Notes (Signed)
 Provider:  Dedra Gores, MD  Primary Care Physician:  Purcell Emil Schanz, MD [address] [place] [address]     Referring Provider: Purcell Emil Milan, [place] [address] [place],  [place] [address]          Chief Complaint according to patient   Patient presents with:                HISTORY OF PRESENT ILLNESS:  Meagan [name] is a 48 y.o. female patient who is here for revisit 05/16/2024 for  CPAP revisit but also has .RLS concerns.   She has had a lot of stress in her job and gained more weight, BMI is 51.3/   She is worried about not being able to go on GLP1.   Chief concern according to patient :   I have no concerns about the CPAP and my sleepiness is reduced,  fatigue is something different,and I started a supplement upon rec. form  my chiropractor,  for adrenal fatigue I have RLS now.   Her compliance on CPAP is 100% and AHI 1.2/h . High 95% at 15.5 cm water.        Meagan [name] is a 48 y.o. female patient who is here for revisit 03/21/2023 for  her first visit n her new CPAP , but she was not told to bring her CPAP (!).   Chief concern according to patient :  I have condensation ater in my nose in AM. The patient underwent a home sleep test which showed severe obstructive sleep apnea with an AHI of 39.9 so to ease the compensated comparison she has severe apnea with an AHI of 40 it did not make a big difference if she slept on her back or on her side, it did not make a big difference if it was a REM sleep or non-REM sleep apnea, she did have minimal oxygen desaturation, her pulse range was in normal range with a mean heart rate of 73 bpm, the overall sleep time recorded was 7-1/2 hours and is valid as such.    Before she started CPAP her Epworth score was 17 / 24 and her fatigue severity score was 60 out of 63 points.  She now endorsed the Epworth Sleepiness Scale at 3 /24 points and the fatigue severity score still at 60 out of 63.   She likes how quiet the CPAP machine is now and she does like her mask so our question today is just to reduce the level of humidity to make it easier to wake up without condensation water in the face.  Her chronic fatigue syndrome is a separate diagnosis from her sleep disorders and is not affected by the treatment of snoring and apnea.   She has been 100% compliant user of CPAP by days and 87% by hours with an average of 6 hours 26 minutes.  Her minimum pressure is set at 5 and her maximum at 18 cmH2O with 3 cm expiratory relief.  The residual AHI is 1.1/h which is an excellent resolution of apnea.  95th percentile air leak is very low at 5.8 L a minute this speaks for good mask fit.  Her pressure at the 95th percentile is 15 cmH2O and is well within the current settings.  No central apneas are arising.  No changes except to the humidity have to be made.   I can't change the setting for humidity from remote.  11-14-2022:  Meagan [name] is a 48 y.o. female patient who is seen upon referral on 11/14/2022 from Dr. Vernon, MD, her pcp. She is here for a recurrence of OSA and sleepiness, in the setting of regaining all her weight she lost 2018-19 on a KETO diet.  Chief concern according to patient : I lost weight and could quit CPAP. I now am back or above the weight of 2017-18 , my BMI is 48.41.  I now feel again sleepy, I snore again, I am fatigued, I hurt all the time. My thyroid  is not functioning, and I am prediabetic.  I have the pleasure of seeing VALENTINE KUECHLE again, a new patient  after a hiatus of 5-6 years, on 11/14/22 a left-handed Caucasian and married  female with a possible sleep disorder.    The patient had the first sleep study in the year 2002 with Dr. 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 This time a PSG  protocol, followed by CPAP titration .  a result of an AHI ( Apnea Hypopnea index)  of o/h.     Review of Systems: Out of a complete 14 system  review, the patient complains of only the following symptoms, and all other reviewed systems are negative.:   SLEEPINESS ?  How likely are you to doze in the following situations: 0 = not likely, 1 = slight chance, 2 = moderate chance, 3 = high chance  Sitting and Reading? Watching Television? Sitting inactive in a public place (theater or meeting)? Lying down in the afternoon when circumstances permit? Sitting and talking to someone? Sitting quietly after lunch without alcohol? In a car, while stopped for a few minutes in traffic? As a passenger in a car for an hour without a break?  Total = 1/ 24,  FSS : 55/ 63.       Social History   Socioeconomic History   Marital status: Married    Spouse name: Charli Halle   Number of children: 1   Years of education: some col.   Highest education level: Associate degree: occupational, Scientist, product/process development, or vocational program  Occupational History    Employer: [employer]    Comment: [employer]  Tobacco Use   Smoking status: Former    Types: Cigarettes    Start date: 09/05/2001   Smokeless tobacco: Never   Tobacco comments:    10 years ago  Vaping Use   Vaping status: Never Used  Substance and Sexual Activity   Alcohol use: Yes    Comment: occasionally,one 3-7 oz of wine per week   Drug use: No   Sexual activity: Not Currently    Birth control/protection: Pill    Comment: kariva  Other Topics Concern   Not on file  Social History Narrative   Patient is single and lives at home, her daughter lives with her.   Patient has one child.   Patient is working full-time.   Patient has some college education.   Patient is left handed.   Patient does not drink any caffeine.   Social Drivers of Health   Financial Resource Strain: Medium Risk (12/22/2023)   Overall Financial Resource Strain (CARDIA)    Difficulty of Paying Living Expenses: Somewhat hard  Food Insecurity: No Food Insecurity (12/22/2023)   Hunger  Vital Sign    Worried About Running Out of Food in the Last Year: Never true    Ran Out of Food in  the Last Year: Never true  Transportation Needs: No Transportation Needs (12/22/2023)   PRAPARE - Administrator, Civil Service (Medical): No    Lack of Transportation (Non-Medical): No  Physical Activity: Unknown (12/22/2023)   Exercise Vital Sign    Days of Exercise per Week: 0 days    Minutes of Exercise per Session: Not on file  Stress: Stress Concern Present (12/22/2023)   [employer] of Occupational Health - Occupational Stress Questionnaire    Feeling of Stress : Very much  Social Connections: Socially Isolated (12/22/2023)   Social Connection and Isolation Panel    Frequency of Communication with Friends and Family: Never    Frequency of Social Gatherings with Friends and Family: Once a week    Attends Religious Services: Never    Database administrator or Organizations: No    Attends Engineer, structural: Not on file    Marital Status: Married    Family History  Problem Relation Age of Onset   Alcohol abuse Mother    Depression Mother    Anxiety disorder Mother    Sleep apnea Mother    Alcoholism Mother    Diabetes Mother    Alcohol abuse Father    Alcoholism Father    Alcohol abuse Brother    Alcoholism Brother    Multiple sclerosis Maternal Grandmother    Breast cancer Paternal Grandmother    Healthy Daughter     Past Medical History:  Diagnosis Date   Acquired hypothyroidism 03/02/2018   Allergy    Seasonal   Anxiety    Chronic pain    Complication of anesthesia    post -op nausea and vomiting at age 89   Constipation, chronic    Depression    Family history of anesthesia complication    cousin woke-up during procedure   GERD (gastroesophageal reflux disease)    Narcolepsy    Other secondary hypertension 09/26/2023   Ruptured lumbar disc    L5   Sleep apnea    wears CPAP nightly   Sleep paralysis    at times   Uncontrolled  narcolepsy 04/03/2013   Diagnosed 2002 , by Dr Reggy Salt. Meanwhile  Medications have become ineffective and patient gained 100 pounds, developed OSA, now on CPAP 9 cm, MSLT to follow in 04-19-13.    Vivid dream     Past Surgical History:  Procedure Laterality Date   CHOLECYSTECTOMY N/A 01/01/2014   Procedure: LAPAROSCOPIC CHOLECYSTECTOMY WITH INTRAOPERATIVE CHOLANGIOGRAM;  Surgeon: Donnice POUR. Tsuei, MD;  Location: [facility] OR;  Service: General;  Laterality: N/A;   TONSILLECTOMY AND ADENOIDECTOMY  1981   age 29   VAGINAL DELIVERY       Current Outpatient Medications on File Prior to Visit  Medication Sig Dispense Refill   FLUoxetine  (PROZAC ) 10 MG tablet TAKE 1/2 TABLET BY MOUTH DAILY 45 tablet 2   levothyroxine  (SYNTHROID ) 75 MCG tablet Take 75 mcg by mouth daily before breakfast.     LO LOESTRIN FE 1 MG-10 MCG / 10 MCG tablet Take 1 tablet by mouth daily.     meloxicam  (MOBIC ) 7.5 MG tablet TAKE 1 TABLET BY MOUTH EVERY DAY 90 tablet 1   montelukast  (SINGULAIR ) 10 MG tablet TAKE 1 TABLET BY MOUTH EVERYDAY AT BEDTIME 30 tablet 11   ondansetron  (ZOFRAN -ODT) 4 MG disintegrating tablet Take 1 tablet (4 mg total) by mouth every 8 (eight) hours as needed for nausea or vomiting. 20 tablet 2   VITAMIN D , CHOLECALCIFEROL,  PO Take by mouth.     No current facility-administered medications on file prior to visit.    Allergies  Allergen Reactions   Cymbalta  [Duloxetine  Hcl]     intolerance   Doxycycline  Hyclate Other (See Comments)    Stomach upset   Wellbutrin  [Bupropion ] Nausea Only   Amoxicillin Itching and Rash     DIAGNOSTIC DATA (LABS, IMAGING, TESTING) - I reviewed patient records, labs, notes, testing and imaging myself where available.  Lab Results  Component Value Date   WBC 8.9 05/01/2023   HGB 12.4 05/01/2023   HCT 37.9 05/01/2023   MCV 81 05/01/2023   PLT 412 05/01/2023      Component Value Date/Time   NA 138 05/01/2023 1505   K 4.9 05/01/2023 1505   CL 101  05/01/2023 1505   CO2 24 05/01/2023 1505   GLUCOSE 81 05/01/2023 1505   GLUCOSE 81 02/17/2021 1257   BUN 18 05/01/2023 1505   CREATININE 0.87 05/01/2023 1505   CALCIUM 9.6 05/01/2023 1505   PROT 7.0 05/01/2023 1505   ALBUMIN 4.4 05/01/2023 1505   AST 19 05/01/2023 1505   ALT 22 05/01/2023 1505   ALKPHOS 84 05/01/2023 1505   BILITOT 0.4 05/01/2023 1505   GFRNONAA 77 02/24/2018 1428   GFRAA 88 02/24/2018 1428   Lab Results  Component Value Date   CHOL 232 (H) 05/01/2023   HDL 68 05/01/2023   LDLCALC 146 (H) 05/01/2023   TRIG 105 05/01/2023   CHOLHDL 2.4 02/24/2018   Lab Results  Component Value Date   HGBA1C 5.9 (H) 05/01/2023   Lab Results  Component Value Date   VITAMINB12 1,119 05/01/2023   Lab Results  Component Value Date   TSH 0.669 05/01/2023    PHYSICAL EXAM:  Vitals:   05/16/24 1325  BP: (!) 140/78  Pulse: 86   No data found. Body mass index is 51.29 kg/m.   Wt Readings from Last 3 Encounters:  05/16/24 262 lb 9.6 oz (119.1 kg)  02/29/24 249 lb (112.9 kg)  12/26/23 261 lb (118.4 kg)     Ht Readings from Last 3 Encounters:  05/16/24 5' (1.524 m)  02/29/24 5' 1 (1.549 m)  12/26/23 5' 1 (1.549 m)      General: The patient is awake, alert and appears not in acute distress and groomed. Head: Normocephalic, atraumatic.  Neck is supple. circumference 17 inches, Mallampati 3    Neurological examination  Mentation: Alert oriented to time, place, history taking. Follows all commands speech and language fluent Cranial nerve II-XII: Pupils were equal round reactive to light. Extraocular movements were full, visual field were full on confrontational test. Facial sensation and strength were normal. Head turning and shoulder shrug  were normal and symmetric .  Motor: Full  5 over 5 strength of all 4 extremities, with  symmetric motor tone ithroughout.  Sensory: Sensory testing is intact to soft touch on all 4 extremities.  DTR brisk , symmetric 3 plus.     ASSESSMENT AND PLAN :   48 y.o. year old female  here with:    1) RLS -the patient husband has jokingly called her a marathon runner but being seated or in an otherwise relaxed position.  This can happen on a plane ride this can happen by seated and seated in the car or here being in an exam room chair.  Getting up and walking completely takes the urge to move away.  She does not describe creepy crawly sensations but she  describes the irresistible urge to move that she cannot fight. It does not keep her form sleeping, does not wake her up- its not related to sleep at all.  She does not have signs of neuropathy.  She does have very brisk reflexes so. Lets look at her iron levels and consider her prediabetic state as additional risk factors.  Last ferritin was 235 in March 2024.     2) the patient is a compliant user of CPAP for her treatment of obstructive sleep apnea, the resolution of apnea is excellent.  She does wonder if her main risk factor is her BMI and now exceeds 51 kg/m2 would make her a candidate for GLP-1 medication.   Her April 2024 HST showed a BMI of 48.4  kg/m2.  Loud snoring    I know that Dr. Verdon is trying to obtain these medications for her patient but we may be able to help Mrs. Hetz by documenting the level of baseline sleep apnea.   This may up help her to obtain Zepbound as an FDA approved medication for moderate to severe sleep apnea   3) low back pain and sciatica can explain the brisk reflexes.  Chiropractor  is treating her.  Can benefit from more exercises , core strength and yoga to be limber.    RV in 12 months, CC to Dr Verdon, Dr Purcell.    Sleep Clinic Patients are generally offered input on sleep hygiene, life style changes and how to improve compliance with medical treatment where applicable. Review and reiteration of good sleep hygiene measures is offered to any sleep clinic patient, be it in the first consultation or with any follow up  visits.  Any patient with sleepiness should be cautioned not to drive, work at heights, or operate dangerous or heavy equipment when feeling tired or sleepy.    The patient will be seen in follow-up in the sleep clinic at [facility] for discussion of test results, sleep related symptoms and treatment compliance review, further management strategies, etc.   The referring provider will be notified of the test results.   The patient's condition requires frequent monitoring and adjustments in the treatment plan, reflecting the ongoing complexity of care.  This provider is the continuing focal point for all needed services for this condition.  After spending a total time of  35  minutes face to face and time for  history taking, physical and neurologic examination, review of laboratory studies,  personal review of imaging studies, reports and results of other testing and review of referral information / records as far as provided in visit,   Electronically signed by: Dedra Gores, MD 05/16/2024 2:06 PM  [facility] Board certified by The [employer] of Sleep Medicine and Diplomate of the [employer] of Sleep Medicine. Board certified In Neurology through the ABPN, Fellow of the [employer] of Neu[employer].

## 2024-05-17 ENCOUNTER — Ambulatory Visit: Payer: Self-pay | Admitting: Neurology

## 2024-05-17 LAB — IRON,TIBC AND FERRITIN PANEL
Ferritin: 47 ng/mL (ref 15–150)
Iron Saturation: 14 % — ABNORMAL LOW (ref 15–55)
Iron: 46 ug/dL (ref 27–159)
Total Iron Binding Capacity: 334 ug/dL (ref 250–450)
UIBC: 288 ug/dL (ref 131–425)

## 2024-05-21 DIAGNOSIS — M546 Pain in thoracic spine: Secondary | ICD-10-CM | POA: Diagnosis not present

## 2024-05-21 DIAGNOSIS — M9903 Segmental and somatic dysfunction of lumbar region: Secondary | ICD-10-CM | POA: Diagnosis not present

## 2024-05-21 DIAGNOSIS — M9905 Segmental and somatic dysfunction of pelvic region: Secondary | ICD-10-CM | POA: Diagnosis not present

## 2024-05-21 DIAGNOSIS — M9906 Segmental and somatic dysfunction of lower extremity: Secondary | ICD-10-CM | POA: Diagnosis not present

## 2024-05-21 DIAGNOSIS — M9902 Segmental and somatic dysfunction of thoracic region: Secondary | ICD-10-CM | POA: Diagnosis not present

## 2024-05-21 DIAGNOSIS — M9904 Segmental and somatic dysfunction of sacral region: Secondary | ICD-10-CM | POA: Diagnosis not present

## 2024-05-21 DIAGNOSIS — M25571 Pain in right ankle and joints of right foot: Secondary | ICD-10-CM | POA: Diagnosis not present

## 2024-05-21 DIAGNOSIS — M25572 Pain in left ankle and joints of left foot: Secondary | ICD-10-CM | POA: Diagnosis not present

## 2024-05-22 ENCOUNTER — Ambulatory Visit: Payer: Self-pay | Admitting: Emergency Medicine

## 2024-05-22 ENCOUNTER — Ambulatory Visit (INDEPENDENT_AMBULATORY_CARE_PROVIDER_SITE_OTHER): Admitting: Emergency Medicine

## 2024-05-22 ENCOUNTER — Ambulatory Visit (INDEPENDENT_AMBULATORY_CARE_PROVIDER_SITE_OTHER)

## 2024-05-22 ENCOUNTER — Encounter: Payer: Self-pay | Admitting: Emergency Medicine

## 2024-05-22 VITALS — BP 126/78 | HR 87 | Temp 98.9°F | Ht 60.0 in | Wt 260.0 lb

## 2024-05-22 DIAGNOSIS — E039 Hypothyroidism, unspecified: Secondary | ICD-10-CM | POA: Diagnosis not present

## 2024-05-22 DIAGNOSIS — E785 Hyperlipidemia, unspecified: Secondary | ICD-10-CM

## 2024-05-22 DIAGNOSIS — J309 Allergic rhinitis, unspecified: Secondary | ICD-10-CM | POA: Diagnosis not present

## 2024-05-22 DIAGNOSIS — G4733 Obstructive sleep apnea (adult) (pediatric): Secondary | ICD-10-CM

## 2024-05-22 DIAGNOSIS — Z0189 Encounter for other specified special examinations: Secondary | ICD-10-CM | POA: Diagnosis not present

## 2024-05-22 DIAGNOSIS — R7303 Prediabetes: Secondary | ICD-10-CM | POA: Diagnosis not present

## 2024-05-22 DIAGNOSIS — R079 Chest pain, unspecified: Secondary | ICD-10-CM

## 2024-05-22 LAB — COMPREHENSIVE METABOLIC PANEL WITH GFR
ALT: 10 U/L (ref 0–35)
AST: 11 U/L (ref 0–37)
Albumin: 4.2 g/dL (ref 3.5–5.2)
Alkaline Phosphatase: 64 U/L (ref 39–117)
BUN: 20 mg/dL (ref 6–23)
CO2: 23 meq/L (ref 19–32)
Calcium: 9.5 mg/dL (ref 8.4–10.5)
Chloride: 103 meq/L (ref 96–112)
Creatinine, Ser: 0.89 mg/dL (ref 0.40–1.20)
GFR: 76.09 mL/min (ref >60.00)
Glucose, Bld: 82 mg/dL (ref 70–99)
Potassium: 4.2 meq/L (ref 3.5–5.1)
Sodium: 138 meq/L (ref 135–145)
Total Bilirubin: 0.3 mg/dL (ref 0.2–1.2)
Total Protein: 7.5 g/dL (ref 6.0–8.3)

## 2024-05-22 LAB — LIPID PANEL
Cholesterol: 222 mg/dL — ABNORMAL HIGH (ref 0–200)
HDL: 65.7 mg/dL (ref >39.00)
LDL Cholesterol: 120 mg/dL — ABNORMAL HIGH (ref 0–99)
NonHDL: 155.94
Total CHOL/HDL Ratio: 3
Triglycerides: 179 mg/dL — ABNORMAL HIGH (ref 0.0–149.0)
VLDL: 35.8 mg/dL (ref 0.0–40.0)

## 2024-05-22 LAB — CBC WITH DIFFERENTIAL/PLATELET
Basophils Absolute: 0.1 K/uL (ref 0.0–0.1)
Basophils Relative: 0.4 % (ref 0.0–3.0)
Eosinophils Absolute: 0.3 K/uL (ref 0.0–0.7)
Eosinophils Relative: 2.2 % (ref 0.0–5.0)
HCT: 38.3 % (ref 36.0–46.0)
Hemoglobin: 12.4 g/dL (ref 12.0–15.0)
Lymphocytes Relative: 32.4 % (ref 12.0–46.0)
Lymphs Abs: 3.8 K/uL (ref 0.7–4.0)
MCHC: 32.4 g/dL (ref 30.0–36.0)
MCV: 79.6 fl (ref 78.0–100.0)
Monocytes Absolute: 0.7 K/uL (ref 0.1–1.0)
Monocytes Relative: 6.1 % (ref 3.0–12.0)
Neutro Abs: 7 K/uL (ref 1.4–7.7)
Neutrophils Relative %: 58.9 % (ref 43.0–77.0)
Platelets: 401 K/uL — ABNORMAL HIGH (ref 150.0–400.0)
RBC: 4.82 Mil/uL (ref 3.87–5.11)
RDW: 14.9 % (ref 11.5–15.5)
WBC: 11.9 K/uL — ABNORMAL HIGH (ref 4.0–10.5)

## 2024-05-22 LAB — TSH: TSH: 3.59 u[IU]/mL (ref 0.35–5.50)

## 2024-05-22 LAB — HEMOGLOBIN A1C: Hgb A1c MFr Bld: 6.4 % (ref 4.6–6.5)

## 2024-05-22 NOTE — Assessment & Plan Note (Signed)
Cardiovascular risks associated with diabetes discussed Diet and nutrition discussed.

## 2024-05-22 NOTE — Progress Notes (Signed)
 Meagan [name] 48 y.o.   Chief Complaint  Patient presents with   heart health     Patient here to discuss her heart health, wanted to her base line, if she is okay to exercise.  Patient states she does not need refills right now but the next time would like them to be sent to the office, it has been sent to her old PCP    HISTORY OF PRESENT ILLNESS: This is a 48 y.o. female wants to go back to exercise routines.  Concerned about her heart health. Occasionally gets pain to left upper chest into the neck.  Not sure if it is cardiac or not.   No significant family history of heart disease. No other complaints or medical concerns today. Wt Readings from Last 3 Encounters:  05/22/24 260 lb (117.9 kg)  05/16/24 262 lb 9.6 oz (119.1 kg)  02/29/24 249 lb (112.9 kg)     HPI   Prior to Admission medications   Medication Sig Start Date End Date Taking? Authorizing Provider  FLUoxetine  (PROZAC ) 10 MG tablet TAKE 1/2 TABLET BY MOUTH DAILY 04/29/22  Yes Levora Reyes SAUNDERS, MD  levothyroxine  (SYNTHROID ) 75 MCG tablet Take 75 mcg by mouth daily before breakfast.   Yes [provider]  LO LOESTRIN FE 1 MG-10 MCG / 10 MCG tablet Take 1 tablet by mouth daily. 01/28/24  Yes [provider]  Magnesium  Gluconate 250 MG TABS Take 1 tablet (250 mg total) by mouth 2 (two) times daily. 05/16/24  Yes Dohmeier, Dedra, MD  meloxicam  (MOBIC ) 7.5 MG tablet TAKE 1 TABLET BY MOUTH EVERY DAY 01/21/22  Yes Levora Reyes SAUNDERS, MD  montelukast  (SINGULAIR ) 10 MG tablet TAKE 1 TABLET BY MOUTH EVERYDAY AT BEDTIME 06/01/21  Yes Levora Reyes SAUNDERS, MD  ondansetron  (ZOFRAN -ODT) 4 MG disintegrating tablet Take 1 tablet (4 mg total) by mouth every 8 (eight) hours as needed for nausea or vomiting. 12/26/23  Yes Alfard Cochrane, Emil Schanz, MD  VITAMIN D , CHOLECALCIFEROL, PO Take by mouth.   Yes [provider]    Allergies  Allergen Reactions   Cymbalta  [Duloxetine  Hcl]     intolerance   Doxycycline  Hyclate  Other (See Comments)    Stomach upset   Wellbutrin  [Bupropion ] Nausea Only   Amoxicillin Itching and Rash    Patient Active Problem List   Diagnosis Date Noted   RLS (restless legs syndrome) 05/16/2024   Class 3 severe obesity due to excess calories with serious comorbidity and body mass index (BMI) of 50.0 to 59.9 in adult 05/16/2024   Other secondary hypertension 09/26/2023   Depression 06/13/2023   Prediabetes 05/16/2023   Vitamin D  deficiency 05/16/2023   Pure hypercholesterolemia 05/16/2023   Chronic pain syndrome 11/18/2022   Polyarthralgia 11/18/2022   Hypothyroidism 11/18/2022   Positive ANA (antinuclear antibody) 04/07/2021   Bilateral knee pain 03/17/2018   Seasonal allergic rhinitis 03/02/2018   Acquired hypothyroidism 03/02/2018   RLD (ruptured lumbar disc) 03/02/2018   Generalized anxiety disorder 09/03/2017   Morbid obesity (HCC) 09/03/2017   BMI 45.0-49.9, adult (HCC) 02/09/2015   Severe obstructive sleep apnea-hypopnea syndrome 02/09/2015   Hypersomnia, persistent 01/11/2013    Past Medical History:  Diagnosis Date   Acquired hypothyroidism 03/02/2018   Allergy    Seasonal   Anxiety    Chronic pain    Complication of anesthesia    post -op nausea and vomiting at age 42   Constipation, chronic    Depression    Family history of  anesthesia complication    cousin woke-up during procedure   GERD (gastroesophageal reflux disease)    Narcolepsy    Other secondary hypertension 09/26/2023   Ruptured lumbar disc    L5   Sleep apnea    wears CPAP nightly   Sleep paralysis    at times   Uncontrolled narcolepsy 04/03/2013   Diagnosed 2002 , by Dr Reggy Salt. Meanwhile  Medications have become ineffective and patient gained 100 pounds, developed OSA, now on CPAP 9 cm, MSLT to follow in 04-19-13.    Vivid dream     Past Surgical History:  Procedure Laterality Date   CHOLECYSTECTOMY N/A 01/01/2014   Procedure: LAPAROSCOPIC CHOLECYSTECTOMY WITH  INTRAOPERATIVE CHOLANGIOGRAM;  Surgeon: Donnice POUR. Belinda, MD;  Location: [facility] OR;  Service: General;  Laterality: N/A;   TONSILLECTOMY AND ADENOIDECTOMY  1981   age 71   VAGINAL DELIVERY      Social History   Socioeconomic History   Marital status: Married    Spouse name: Lynesha Bango   Number of children: 1   Years of education: some col.   Highest education level: Associate degree: academic program  Occupational History    Employer: [employer]    Comment: [employer]  Tobacco Use   Smoking status: Former    Types: Cigarettes    Start date: 09/05/2001   Smokeless tobacco: Never   Tobacco comments:    10 years ago  Vaping Use   Vaping status: Never Used  Substance and Sexual Activity   Alcohol use: Yes    Comment: occasionally,one 3-7 oz of wine per week   Drug use: No   Sexual activity: Not Currently    Birth control/protection: Pill    Comment: kariva  Other Topics Concern   Not on file  Social History Narrative   Patient is single and lives at home, her daughter lives with her.   Patient has one child.   Patient is working full-time.   Patient has some college education.   Patient is left handed.   Patient does not drink any caffeine.   Social Drivers of Corporate investment banker Strain: High Risk (05/20/2024)   Overall Financial Resource Strain (CARDIA)    Difficulty of Paying Living Expenses: Very hard  Food Insecurity: No Food Insecurity (05/20/2024)   Hunger Vital Sign    Worried About Running Out of Food in the Last Year: Never true    Ran Out of Food in the Last Year: Never true  Transportation Needs: No Transportation Needs (05/20/2024)   PRAPARE - Administrator, Civil Service (Medical): No    Lack of Transportation (Non-Medical): No  Physical Activity: Inactive (05/20/2024)   Exercise Vital Sign    Days of Exercise per Week: 0 days    Minutes of Exercise per Session: Not on file  Stress: Stress Concern Present  (05/20/2024)   [employer] of Occupational Health - Occupational Stress Questionnaire    Feeling of Stress: Rather much  Social Connections: Moderately Isolated (05/20/2024)   Social Connection and Isolation Panel    Frequency of Communication with Friends and Family: Twice a week    Frequency of Social Gatherings with Friends and Family: Once a week    Attends Religious Services: Never    Database administrator or Organizations: No    Attends Engineer, structural: Not on file    Marital Status: Married  Catering manager Violence: Not on file  Family History  Problem Relation Age of Onset   Alcohol abuse Mother    Depression Mother    Anxiety disorder Mother    Sleep apnea Mother    Alcoholism Mother    Diabetes Mother    Alcohol abuse Father    Alcoholism Father    Alcohol abuse Brother    Alcoholism Brother    Multiple sclerosis Maternal Grandmother    Breast cancer Paternal Grandmother    Healthy Daughter      Review of Systems  Constitutional: Negative.  Negative for chills and fever.  HENT: Negative.  Negative for congestion and sore throat.   Respiratory: Negative.  Negative for cough and shortness of breath.   Cardiovascular:  Positive for chest pain. Negative for palpitations.  Gastrointestinal:  Negative for abdominal pain, diarrhea, nausea and vomiting.  Genitourinary: Negative.  Negative for dysuria and hematuria.  Skin: Negative.  Negative for rash.  Neurological: Negative.  Negative for dizziness and headaches.  All other systems reviewed and are negative.   Vitals:   05/22/24 1516  BP: 126/78  Pulse: 87  Temp: 98.9 F (37.2 C)  SpO2: 97%    Physical Exam Constitutional:      Appearance: She is obese.  HENT:     Head: Normocephalic.     Mouth/Throat:     Mouth: Mucous membranes are moist.     Pharynx: Oropharynx is clear.  Eyes:     Extraocular Movements: Extraocular movements intact.  Cardiovascular:     Rate and Rhythm:  Normal rate and regular rhythm.     Pulses: Normal pulses.     Heart sounds: Normal heart sounds.  Pulmonary:     Effort: Pulmonary effort is normal.     Breath sounds: Normal breath sounds.  Musculoskeletal:     Cervical back: No tenderness.  Lymphadenopathy:     Cervical: No cervical adenopathy.  Skin:    General: Skin is warm and dry.     Capillary Refill: Capillary refill takes less than 2 seconds.  Neurological:     General: No focal deficit present.     Mental Status: She is alert and oriented to person, place, and time.  Psychiatric:        Mood and Affect: Mood normal.        Behavior: Behavior normal.   EKG: Normal sinus rhythm with ventricular rate of 84/min.  No acute ischemic changes.  Normal EKG. DG Chest 2 View Result Date: 05/22/2024 CLINICAL DATA:  Heart size evaluation EXAM: DG CHEST 2V COMPARISON:  07/19/2018 FINDINGS: No focal airspace consolidation, pleural effusion, or pneumothorax. No cardiomegaly.No acute fracture or destructive lesion. Cholecystectomy clips. Multilevel thoracic osteophytosis. IMPRESSION: No acute cardiopulmonary abnormality. Electronically Signed   By: Rogelia Myers M.D.   On: 05/22/2024 16:18     ASSESSMENT & PLAN: A total of 44 minutes was spent with the patient and counseling/coordination of care regarding preparing for this visit, review of most recent office visit notes, review of multiple chronic medical conditions and their management, differential diagnosis of chest pain and need for workup, review of chest x-ray images done today, review of all medications, review of most recent bloodwork results, review of health maintenance items, education on nutrition, prognosis, documentation, and need for follow up.   Problem List Items Addressed This Visit       Respiratory   Severe obstructive sleep apnea-hypopnea syndrome   Stable. On CPAP treatment.         Endocrine  Acquired hypothyroidism   Clinically euthyroid. Continue  Synthroid  75 mcg daily TSH done today        Other   Morbid obesity (HCC)   Goes to medical weight management on a regular basis Diet and nutrition discussed Advised to decrease amount of daily carbohydrate intake and daily calories and increase amount of plant based protein in her diet Benefits of exercise discussed      Prediabetes   Cardiovascular risks associated with diabetes discussed Diet and nutrition discussed      Nonspecific chest pain - Primary   Clinically stable.  No red flag signs or symptoms. No significant family history of heart conditions Unremarkable physical exam.  Normotensive. Normal EKG.  Normal chest x-ray. Differential diagnosis discussed. Recommend cardiac scoring CT scan Benefits of exercise discussed       Relevant Orders   Comprehensive metabolic panel with GFR   CBC with Differential/Platelet   Hemoglobin A1c   Lipid panel   TSH   DG Chest 2 View (Completed)   CT CARDIAC SCORING (SELF PAY ONLY)   Dyslipidemia   Diet and nutrition discussed Lipid profile done today The 10-year ASCVD risk score (Arnett DK, et al., 2019) is: 1%   Values used to calculate the score:     Age: 29 years     Clincally relevant sex: Female     Is Non-Hispanic African American: No     Diabetic: No     Tobacco smoker: No     Systolic Blood Pressure: 126 mmHg     Is BP treated: No     HDL Cholesterol: 68 mg/dL     Total Cholesterol: 232 mg/dL       Other Visit Diagnoses       Allergic rhinitis, unspecified seasonality, unspecified trigger          Patient Instructions  Health Maintenance, Female Adopting a healthy lifestyle and getting preventive care are important in promoting health and wellness. Ask your health care provider about: The right schedule for you to have regular tests and exams. Things you can do on your own to prevent diseases and keep yourself healthy. What should I know about diet, weight, and exercise? Eat a healthy diet  Eat a  diet that includes plenty of vegetables, fruits, low-fat dairy products, and lean protein. Do not eat a lot of foods that are high in solid fats, added sugars, or sodium. Maintain a healthy weight Body mass index (BMI) is used to identify weight problems. It estimates body fat based on height and weight. Your health care provider can help determine your BMI and help you achieve or maintain a healthy weight. Get regular exercise Get regular exercise. This is one of the most important things you can do for your health. Most adults should: Exercise for at least 150 minutes each week. The exercise should increase your heart rate and make you sweat (moderate-intensity exercise). Do strengthening exercises at least twice a week. This is in addition to the moderate-intensity exercise. Spend less time sitting. Even light physical activity can be beneficial. Watch cholesterol and blood lipids Have your blood tested for lipids and cholesterol at 49 years of age, then have this test every 5 years. Have your cholesterol levels checked more often if: Your lipid or cholesterol levels are high. You are older than 49 years of age. You are at high risk for heart disease. What should I know about cancer screening? Depending on your health history and family history, you may  need to have cancer screening at various ages. This may include screening for: Breast cancer. Cervical cancer. Colorectal cancer. Skin cancer. Lung cancer. What should I know about heart disease, diabetes, and high blood pressure? Blood pressure and heart disease High blood pressure causes heart disease and increases the risk of stroke. This is more likely to develop in people who have high blood pressure readings or are overweight. Have your blood pressure checked: Every 3-5 years if you are 90-71 years of age. Every year if you are 33 years old or older. Diabetes Have regular diabetes screenings. This checks your fasting blood sugar  level. Have the screening done: Once every three years after age 47 if you are at a normal weight and have a low risk for diabetes. More often and at a younger age if you are overweight or have a high risk for diabetes. What should I know about preventing infection? Hepatitis B If you have a higher risk for hepatitis B, you should be screened for this virus. Talk with your health care provider to find out if you are at risk for hepatitis B infection. Hepatitis C Testing is recommended for: Everyone born from [birth year] through 1966. Anyone with known risk factors for hepatitis C. Sexually transmitted infections (STIs) Get screened for STIs, including gonorrhea and chlamydia, if: You are sexually active and are younger than 49 years of age. You are older than 49 years of age and your health care provider tells you that you are at risk for this type of infection. Your sexual activity has changed since you were last screened, and you are at increased risk for chlamydia or gonorrhea. Ask your health care provider if you are at risk. Ask your health care provider about whether you are at high risk for HIV. Your health care provider may recommend a prescription medicine to help prevent HIV infection. If you choose to take medicine to prevent HIV, you should first get tested for HIV. You should then be tested every 3 months for as long as you are taking the medicine. Pregnancy If you are about to stop having your period (premenopausal) and you may become pregnant, seek counseling before you get pregnant. Take 400 to 800 micrograms (mcg) of folic acid every day if you become pregnant. Ask for birth control (contraception) if you want to prevent pregnancy. Osteoporosis and menopause Osteoporosis is a disease in which the bones lose minerals and strength with aging. This can result in bone fractures. If you are 34 years old or older, or if you are at risk for osteoporosis and fractures, ask your health care  provider if you should: Be screened for bone loss. Take a calcium or vitamin D  supplement to lower your risk of fractures. Be given hormone replacement therapy (HRT) to treat symptoms of menopause. Follow these instructions at home: Alcohol use Do not drink alcohol if: Your health care provider tells you not to drink. You are pregnant, may be pregnant, or are planning to become pregnant. If you drink alcohol: Limit how much you have to: 0-1 drink a day. Know how much alcohol is in your drink. In the [place] one drink equals one 12 oz bottle of beer (355 mL), one 5 oz glass of wine (148 mL), or one 1 oz glass of hard liquor (44 mL). Lifestyle Do not use any products that contain nicotine or tobacco. These products include cigarettes, chewing tobacco, and vaping devices, such as e-cigarettes. If you need help quitting, ask your health care  provider. Do not use street drugs. Do not share needles. Ask your health care provider for help if you need support or information about quitting drugs. General instructions Schedule regular health, dental, and eye exams. Stay current with your vaccines. Tell your health care provider if: You often feel depressed. You have ever been abused or do not feel safe at home. Summary Adopting a healthy lifestyle and getting preventive care are important in promoting health and wellness. Follow your health care provider's instructions about healthy diet, exercising, and getting tested or screened for diseases. Follow your health care provider's instructions on monitoring your cholesterol and blood pressure. This information is not intended to replace advice given to you by your health care provider. Make sure you discuss any questions you have with your health care provider. Document Revised: 01/11/2021 Document Reviewed: 01/11/2021 Elsevier Patient Education  2024 Elsevier Inc.     Emil Schaumann, MD [facility]

## 2024-05-22 NOTE — Assessment & Plan Note (Signed)
 Clinically euthyroid Continue Synthroid  75 mcg daily TSH done today

## 2024-05-22 NOTE — Assessment & Plan Note (Signed)
 Goes to medical weight management on a regular basis Diet and nutrition discussed Advised to decrease amount of daily carbohydrate intake and daily calories and increase amount of plant based protein in her diet Benefits of exercise discussed

## 2024-05-22 NOTE — Assessment & Plan Note (Signed)
 Clinically stable.  No red flag signs or symptoms. No significant family history of heart conditions Unremarkable physical exam.  Normotensive. Normal EKG.  Normal chest x-ray. Differential diagnosis discussed. Recommend cardiac scoring CT scan Benefits of exercise discussed

## 2024-05-22 NOTE — Assessment & Plan Note (Signed)
Stable.  On CPAP treatment. 

## 2024-05-22 NOTE — Patient Instructions (Signed)

## 2024-05-22 NOTE — Assessment & Plan Note (Signed)
 Diet and nutrition discussed Lipid profile done today The 10-year ASCVD risk score (Arnett DK, et al., 2019) is: 1%   Values used to calculate the score:     Age: 48 years     Clincally relevant sex: Female     Is Non-Hispanic African American: No     Diabetic: No     Tobacco smoker: No     Systolic Blood Pressure: 126 mmHg     Is BP treated: No     HDL Cholesterol: 68 mg/dL     Total Cholesterol: 232 mg/dL

## 2024-05-23 ENCOUNTER — Other Ambulatory Visit (HOSPITAL_COMMUNITY): Payer: Self-pay

## 2024-05-23 ENCOUNTER — Telehealth: Payer: Self-pay

## 2024-05-23 DIAGNOSIS — F902 Attention-deficit hyperactivity disorder, combined type: Secondary | ICD-10-CM | POA: Diagnosis not present

## 2024-05-23 DIAGNOSIS — F411 Generalized anxiety disorder: Secondary | ICD-10-CM | POA: Diagnosis not present

## 2024-05-23 NOTE — Telephone Encounter (Signed)
 Pharmacy Patient Advocate Encounter   Received notification from Patient Advice Request messages that prior authorization for Zepbound 2.5MG /0.5ML pen-injectors  is required/requested.   Insurance verification completed.   The patient is insured through [facility] .   Per test claim: PA required; PA submitted to above mentioned insurance via Latent Key/confirmation #/EOC [facility] Status is pending

## 2024-05-23 NOTE — Telephone Encounter (Signed)
 Pharmacy Patient Advocate Encounter  Received notification from [employer] that Prior Authorization for Zepbound 2.5MG /0.5ML pen-injectors has been APPROVED from 05/23/24 to 05/23/25. Ran test claim, Copay is $30. This test claim was processed through [facility]- copay amounts may vary at other pharmacies due to pharmacy/plan contracts, or as the patient moves through the different stages of their insurance plan.   PA #/Case ID/Reference #: [id number]

## 2024-05-27 ENCOUNTER — Other Ambulatory Visit (INDEPENDENT_AMBULATORY_CARE_PROVIDER_SITE_OTHER): Payer: Self-pay | Admitting: Radiology

## 2024-05-27 DIAGNOSIS — R079 Chest pain, unspecified: Secondary | ICD-10-CM | POA: Diagnosis not present

## 2024-05-28 ENCOUNTER — Other Ambulatory Visit: Payer: Self-pay | Admitting: Emergency Medicine

## 2024-05-28 MED ORDER — ZEPBOUND 2.5 MG/0.5ML ~~LOC~~ SOAJ: 2.5000 mg | SUBCUTANEOUS | 3 refills | Status: AC

## 2024-05-28 NOTE — Telephone Encounter (Signed)
 New prescription for Zepbound sent to pharmacy of record today.  Thanks.

## 2024-05-29 ENCOUNTER — Ambulatory Visit (HOSPITAL_COMMUNITY)
Admission: RE | Admit: 2024-05-29 | Discharge: 2024-05-29 | Disposition: A | Discharge status: Home / Self Care | Payer: Self-pay | Source: Ambulatory Visit | Attending: Cardiovascular Disease | Admitting: Cardiovascular Disease

## 2024-05-29 DIAGNOSIS — R079 Chest pain, unspecified: Secondary | ICD-10-CM | POA: Insufficient documentation

## 2024-05-31 NOTE — Telephone Encounter (Signed)
 Yes

## 2024-06-05 DIAGNOSIS — M25572 Pain in left ankle and joints of left foot: Secondary | ICD-10-CM | POA: Diagnosis not present

## 2024-06-05 DIAGNOSIS — M9902 Segmental and somatic dysfunction of thoracic region: Secondary | ICD-10-CM | POA: Diagnosis not present

## 2024-06-05 DIAGNOSIS — M9905 Segmental and somatic dysfunction of pelvic region: Secondary | ICD-10-CM | POA: Diagnosis not present

## 2024-06-05 DIAGNOSIS — M9903 Segmental and somatic dysfunction of lumbar region: Secondary | ICD-10-CM | POA: Diagnosis not present

## 2024-06-05 DIAGNOSIS — M9904 Segmental and somatic dysfunction of sacral region: Secondary | ICD-10-CM | POA: Diagnosis not present

## 2024-06-05 DIAGNOSIS — M9906 Segmental and somatic dysfunction of lower extremity: Secondary | ICD-10-CM | POA: Diagnosis not present

## 2024-06-05 DIAGNOSIS — M546 Pain in thoracic spine: Secondary | ICD-10-CM | POA: Diagnosis not present

## 2024-06-05 DIAGNOSIS — M25571 Pain in right ankle and joints of right foot: Secondary | ICD-10-CM | POA: Diagnosis not present

## 2024-06-12 DIAGNOSIS — M9903 Segmental and somatic dysfunction of lumbar region: Secondary | ICD-10-CM | POA: Diagnosis not present

## 2024-06-12 DIAGNOSIS — M9904 Segmental and somatic dysfunction of sacral region: Secondary | ICD-10-CM | POA: Diagnosis not present

## 2024-06-12 DIAGNOSIS — M546 Pain in thoracic spine: Secondary | ICD-10-CM | POA: Diagnosis not present

## 2024-06-12 DIAGNOSIS — M25572 Pain in left ankle and joints of left foot: Secondary | ICD-10-CM | POA: Diagnosis not present

## 2024-06-12 DIAGNOSIS — M9902 Segmental and somatic dysfunction of thoracic region: Secondary | ICD-10-CM | POA: Diagnosis not present

## 2024-06-12 DIAGNOSIS — M9906 Segmental and somatic dysfunction of lower extremity: Secondary | ICD-10-CM | POA: Diagnosis not present

## 2024-06-12 DIAGNOSIS — M25571 Pain in right ankle and joints of right foot: Secondary | ICD-10-CM | POA: Diagnosis not present

## 2024-06-12 DIAGNOSIS — M9905 Segmental and somatic dysfunction of pelvic region: Secondary | ICD-10-CM | POA: Diagnosis not present

## 2024-06-15 ENCOUNTER — Encounter: Payer: Self-pay | Admitting: Emergency Medicine

## 2024-06-15 ENCOUNTER — Ambulatory Visit
Admission: EM | Admit: 2024-06-15 | Discharge: 2024-06-15 | Disposition: A | Discharge status: Home / Self Care | Attending: Emergency Medicine | Admitting: Emergency Medicine

## 2024-06-15 DIAGNOSIS — S0502XA Injury of conjunctiva and corneal abrasion without foreign body, left eye, initial encounter: Secondary | ICD-10-CM

## 2024-06-15 DIAGNOSIS — H1132 Conjunctival hemorrhage, left eye: Secondary | ICD-10-CM | POA: Diagnosis not present

## 2024-06-15 DIAGNOSIS — Z23 Encounter for immunization: Secondary | ICD-10-CM | POA: Diagnosis not present

## 2024-06-15 MED ORDER — TETANUS-DIPHTH-ACELL PERTUSSIS 5-2-15.5 LF-MCG/0.5 IM SUSP
0.5000 mL | Freq: Once | INTRAMUSCULAR | Status: AC
  Administered 2024-06-15: 0.5 mL via INTRAMUSCULAR

## 2024-06-15 MED ORDER — POLYMYXIN B-TRIMETHOPRIM 10000-0.1 UNIT/ML-% OP SOLN: 1.0000 [drp] | Freq: Four times a day (QID) | OPHTHALMIC | 0 refills | Status: AC

## 2024-06-15 NOTE — ED Provider Notes (Signed)
 [facility]    CSN: [id number] Arrival date & time: 06/15/24  1413      History   Chief Complaint Chief Complaint  Patient presents with   Eye Problem    HPI Meagan [name] is a 48 y.o. female.   Patient presents to clinic over concern of blood and erythema to the left internal conjunctiva of the eye after getting hit with some metal taking an old picture frame apart.  Unsure when last Tdap was.  Is not having any pain or vision changes.  Wiped her eye after getting the metal in it, noticed some blood so she decided to come into clinic.  Has iced eye prior to arrival.  Metal piece was intact after hitting her eye.  Denies foreign body sensation.  Denies vision changes.  The history is provided by the patient and medical records.  Eye Problem   Past Medical History:  Diagnosis Date   Acquired hypothyroidism 03/02/2018   Allergy    Seasonal   Anxiety    Chronic pain    Complication of anesthesia    post -op nausea and vomiting at age 68   Constipation, chronic    Depression    Dyslipidemia 05/22/2024   Family history of anesthesia complication    cousin woke-up during procedure   GERD (gastroesophageal reflux disease)    Narcolepsy    Other secondary hypertension 09/26/2023   Ruptured lumbar disc    L5   Sleep apnea    wears CPAP nightly   Sleep paralysis    at times   Uncontrolled narcolepsy 04/03/2013   Diagnosed 2002 , by Dr Reggy Salt. Meanwhile  Medications have become ineffective and patient gained 100 pounds, developed OSA, now on CPAP 9 cm, MSLT to follow in 04-19-13.    Vivid dream     Patient Active Problem List   Diagnosis Date Noted   Nonspecific chest pain 05/22/2024   Dyslipidemia 05/22/2024   RLS (restless legs syndrome) 05/16/2024   Class 3 severe obesity due to excess calories with serious comorbidity and body mass index (BMI) of 50.0 to 59.9 in adult [facility]) 05/16/2024   Depression 06/13/2023   Prediabetes 05/16/2023   Vitamin D   deficiency 05/16/2023   Pure hypercholesterolemia 05/16/2023   Chronic pain syndrome 11/18/2022   Polyarthralgia 11/18/2022   Hypothyroidism 11/18/2022   Positive ANA (antinuclear antibody) 04/07/2021   Seasonal allergic rhinitis 03/02/2018   Acquired hypothyroidism 03/02/2018   Generalized anxiety disorder 09/03/2017   Morbid obesity (HCC) 09/03/2017   BMI 45.0-49.9, adult (HCC) 02/09/2015   Severe obstructive sleep apnea-hypopnea syndrome 02/09/2015   Hypersomnia, persistent 01/11/2013    Past Surgical History:  Procedure Laterality Date   CHOLECYSTECTOMY N/A 01/01/2014   Procedure: LAPAROSCOPIC CHOLECYSTECTOMY WITH INTRAOPERATIVE CHOLANGIOGRAM;  Surgeon: Donnice MARLA. Belinda, MD;  Location: [facility] OR;  Service: General;  Laterality: N/A;   TONSILLECTOMY AND ADENOIDECTOMY  1981   age 52   VAGINAL DELIVERY      OB History     Gravida  1   Para  1   Term  1   Preterm      AB      Living  1      SAB      IAB      Ectopic      Multiple      Live Births               Home Medications    Prior to Admission  medications   Medication Sig Start Date End Date Taking? Authorizing Provider  trimethoprim -polymyxin b  (POLYTRIM ) ophthalmic solution Place 1 drop into the left eye every 6 (six) hours for 5 days. 06/15/24 06/20/24 Yes Alyxandra Tenbrink  N, FNP  FLUoxetine  (PROZAC ) 10 MG tablet TAKE 1/2 TABLET BY MOUTH DAILY 04/29/22   Levora Reyes SAUNDERS, MD  levothyroxine  (SYNTHROID ) 75 MCG tablet Take 75 mcg by mouth daily before breakfast.    [provider]  LO LOESTRIN FE 1 MG-10 MCG / 10 MCG tablet Take 1 tablet by mouth daily. 01/28/24   [provider]  Magnesium  Gluconate 250 MG TABS Take 1 tablet (250 mg total) by mouth 2 (two) times daily. 05/16/24   Dohmeier, Dedra, MD  meloxicam  (MOBIC ) 7.5 MG tablet TAKE 1 TABLET BY MOUTH EVERY DAY 01/21/22   Levora Reyes SAUNDERS, MD  montelukast  (SINGULAIR ) 10 MG tablet TAKE 1 TABLET BY MOUTH EVERYDAY AT BEDTIME 06/01/21    Levora Reyes SAUNDERS, MD  ondansetron  (ZOFRAN -ODT) 4 MG disintegrating tablet Take 1 tablet (4 mg total) by mouth every 8 (eight) hours as needed for nausea or vomiting. 12/26/23   Sagardia, Miguel Jose, MD  tirzepatide  (ZEPBOUND ) 2.5 MG/0.5ML Pen Inject 2.5 mg into the skin once a week. Increase to 5 mg weekly after 3 to 4 weeks if side effects tolerated 05/28/24   Purcell Emil Schanz, MD  VITAMIN D , CHOLECALCIFEROL, PO Take by mouth.    [provider]    Family History Family History  Problem Relation Age of Onset   Alcohol abuse Mother    Depression Mother    Anxiety disorder Mother    Sleep apnea Mother    Alcoholism Mother    Diabetes Mother    Alcohol abuse Father    Alcoholism Father    Alcohol abuse Brother    Alcoholism Brother    Multiple sclerosis Maternal Grandmother    Breast cancer Paternal Grandmother    Healthy Daughter     Social History Social History   Tobacco Use   Smoking status: Former    Types: Cigarettes    Start date: 09/05/2001   Smokeless tobacco: Never   Tobacco comments:    10 years ago  Vaping Use   Vaping status: Never Used  Substance Use Topics   Alcohol use: Yes    Comment: occasionally,one 3-7 oz of wine per week   Drug use: No     Allergies   Cymbalta  [duloxetine  hcl], Doxycycline  hyclate, Wellbutrin  [bupropion ], and Amoxicillin   Review of Systems Review of Systems  Per HPI  Physical Exam Triage Vital Signs ED Triage Vitals  Encounter Vitals Group     BP 06/15/24 1445 131/88     Girls Systolic BP Percentile --      Girls Diastolic BP Percentile --      Boys Systolic BP Percentile --      Boys Diastolic BP Percentile --      Pulse Rate 06/15/24 1445 83     Resp 06/15/24 1445 17     Temp 06/15/24 1445 98 F (36.7 C)     Temp Source 06/15/24 1445 Oral     SpO2 06/15/24 1445 96 %     Weight --      Height --      Head Circumference --      Peak Flow --      Pain Score 06/15/24 1449 0     Pain Loc --       Pain Education --  Exclude from Growth Chart --    No data found.  Updated Vital Signs BP 131/88 (BP Location: Right Arm)   Pulse 83   Temp 98 F (36.7 C) (Oral)   Resp 17   SpO2 96%   Visual Acuity Right Eye Distance: 20/20 Left Eye Distance: 20/20 Bilateral Distance: 20/20  Right Eye Near:   Left Eye Near:    Bilateral Near:     Physical Exam Vitals and nursing note reviewed.  Constitutional:      Appearance: Normal appearance.  HENT:     Head: Normocephalic and atraumatic.     Right Ear: External ear normal.     Left Ear: External ear normal.     Nose: Nose normal.     Mouth/Throat:     Mouth: Mucous membranes are moist.  Eyes:     General: Lids are normal. Lids are everted, no foreign bodies appreciated. Vision grossly intact. Gaze aligned appropriately.        Left eye: No foreign body.     Extraocular Movements: Extraocular movements intact.     Left eye: Normal extraocular motion.     Conjunctiva/sclera:     Left eye: Hemorrhage present.     Pupils: Pupils are equal, round, and reactive to light.   Cardiovascular:     Rate and Rhythm: Normal rate.  Pulmonary:     Effort: Pulmonary effort is normal. No respiratory distress.  Neurological:     General: No focal deficit present.     Mental Status: She is alert and oriented to person, place, and time.  Psychiatric:        Mood and Affect: Mood normal.        Behavior: Behavior normal. Behavior is cooperative.      UC Treatments / Results  Labs (all labs ordered are listed, but only abnormal results are displayed) Labs Reviewed - No data to display  EKG   Radiology No results found.  Procedures Procedures (including critical care time)  Medications Ordered in UC Medications  Tdap (ADACEL) injection 0.5 mL (0.5 mLs Intramuscular Given 06/15/24 1515)    Initial Impression / Assessment and Plan / UC Course  I have reviewed the triage vital signs and the nursing notes.  Pertinent labs &  imaging results that were available during my care of the patient were reviewed by me and considered in my medical decision making (see chart for details).  Vitals and triage reviewed, patient is hemodynamically stable. PERRLA.   Subconjunctival hemorrhage to the right inner conjunctiva with overlying conjunctival abrasion on fluorescein stain.  Vision intact.  Without pain.  Tdap updated due to injury with metal.  Will place on Polytrim  due to patient's aversion of placing these in her eye and ointment.  This will cover for bacterial etiology from old metal picture frame.  Strict emergency/ophthalmology follow-ups given if symptoms evolve. Plan of care, follow-up care return precautions given, no questions at this time.    Final Clinical Impressions(s) / UC Diagnoses   Final diagnoses:  Conjunctival abrasion, left, initial encounter  Subconjunctival hemorrhage of left eye     Discharge Instructions      Use the antibiotic eyedrops every 6 hours for the next 5 days to help soothe the eye and prevent bacterial infection.  The subconjunctival hemorrhage to the eye will gradually improve over the next week or so, think of this as a bruise to the eye.  Can do ice to help with any swelling.  We have  updated your Tdap vaccine today.  Seek immediate care at the nearest emergency department or with your eye doctor if you develop any severe pain or vision changes.    ED Prescriptions     Medication Sig Dispense Auth. Provider   trimethoprim -polymyxin b  (POLYTRIM ) ophthalmic solution Place 1 drop into the left eye every 6 (six) hours for 5 days. 10 mL Dreama, Amarylis Rovito  N, FNP      PDMP not reviewed this encounter.   Dreama Rakel Junio  N, FNP 06/15/24 [phone]

## 2024-06-15 NOTE — ED Triage Notes (Addendum)
 Pt present after a she was hanging a picture frame today and a metal piece hit her in the left eye. Denies any pain does have redness and drainage.  Doesn't know last tetanus

## 2024-06-15 NOTE — Discharge Instructions (Addendum)
 Use the antibiotic eyedrops every 6 hours for the next 5 days to help soothe the eye and prevent bacterial infection.  The subconjunctival hemorrhage to the eye will gradually improve over the next week or so, think of this as a bruise to the eye.  Can do ice to help with any swelling.  We have updated your Tdap vaccine today.  Seek immediate care at the nearest emergency department or with your eye doctor if you develop any severe pain or vision changes.

## 2024-06-20 DIAGNOSIS — F902 Attention-deficit hyperactivity disorder, combined type: Secondary | ICD-10-CM | POA: Diagnosis not present

## 2024-06-20 DIAGNOSIS — H1132 Conjunctival hemorrhage, left eye: Secondary | ICD-10-CM | POA: Diagnosis not present

## 2024-06-20 DIAGNOSIS — H53142 Visual discomfort, left eye: Secondary | ICD-10-CM | POA: Diagnosis not present

## 2024-06-26 DIAGNOSIS — M9904 Segmental and somatic dysfunction of sacral region: Secondary | ICD-10-CM | POA: Diagnosis not present

## 2024-06-26 DIAGNOSIS — M9902 Segmental and somatic dysfunction of thoracic region: Secondary | ICD-10-CM | POA: Diagnosis not present

## 2024-06-26 DIAGNOSIS — M9903 Segmental and somatic dysfunction of lumbar region: Secondary | ICD-10-CM | POA: Diagnosis not present

## 2024-06-26 DIAGNOSIS — M9905 Segmental and somatic dysfunction of pelvic region: Secondary | ICD-10-CM | POA: Diagnosis not present

## 2024-06-26 DIAGNOSIS — M9906 Segmental and somatic dysfunction of lower extremity: Secondary | ICD-10-CM | POA: Diagnosis not present

## 2024-06-26 DIAGNOSIS — M25571 Pain in right ankle and joints of right foot: Secondary | ICD-10-CM | POA: Diagnosis not present

## 2024-06-26 DIAGNOSIS — M25572 Pain in left ankle and joints of left foot: Secondary | ICD-10-CM | POA: Diagnosis not present

## 2024-06-26 DIAGNOSIS — M546 Pain in thoracic spine: Secondary | ICD-10-CM | POA: Diagnosis not present

## 2024-07-01 ENCOUNTER — Encounter (INDEPENDENT_AMBULATORY_CARE_PROVIDER_SITE_OTHER): Payer: Self-pay | Admitting: Family Medicine

## 2024-07-01 ENCOUNTER — Ambulatory Visit (INDEPENDENT_AMBULATORY_CARE_PROVIDER_SITE_OTHER): Admitting: Family Medicine

## 2024-07-01 VITALS — BP 120/80 | HR 98 | Temp 98.6°F | Ht 61.0 in | Wt 251.0 lb

## 2024-07-01 DIAGNOSIS — E611 Iron deficiency: Secondary | ICD-10-CM

## 2024-07-01 DIAGNOSIS — Z6841 Body Mass Index (BMI) 40.0 and over, adult: Secondary | ICD-10-CM

## 2024-07-01 DIAGNOSIS — F5101 Primary insomnia: Secondary | ICD-10-CM

## 2024-07-01 DIAGNOSIS — E7800 Pure hypercholesterolemia, unspecified: Secondary | ICD-10-CM

## 2024-07-01 DIAGNOSIS — G4700 Insomnia, unspecified: Secondary | ICD-10-CM

## 2024-07-01 DIAGNOSIS — R7303 Prediabetes: Secondary | ICD-10-CM | POA: Diagnosis not present

## 2024-07-01 DIAGNOSIS — K5900 Constipation, unspecified: Secondary | ICD-10-CM

## 2024-07-01 DIAGNOSIS — K5901 Slow transit constipation: Secondary | ICD-10-CM

## 2024-07-01 DIAGNOSIS — E669 Obesity, unspecified: Secondary | ICD-10-CM

## 2024-07-01 NOTE — Progress Notes (Signed)
 Office: [phone]  /  Fax: [phone]  WEIGHT SUMMARY AND BIOMETRICS  Anthropometric Measurements Height: 5' 1 (1.549 m) Weight: 251 lb (113.9 kg) BMI (Calculated): 47.45 Weight at Last Visit: 249 lb Weight Lost Since Last Visit: 0 Weight Gained Since Last Visit: 2 lb Starting Weight: 258 lb Total Weight Loss (lbs): 7 lb (3.175 kg) Peak Weight: 262 lb   Body Composition  Body Fat %: 48.7 % Fat Mass (lbs): 122.6 lbs Muscle Mass (lbs): 122.6 lbs Total Body Water (lbs): 85 lbs Visceral Fat Rating : 17   Other Clinical Data Fasting: no Labs: no Today's Visit #: 11 Starting Date: 05/01/23    Chief Complaint: OBESITY    History of Present Illness Meagan [name] is a 48 year old female with obesity who presents for obesity treatment and progress assessment. She was prescribed Zepbound  by her primary care physician, Dr. Purcell.  She was last seen four months ago and has since been prescribed Zepbound . She has gained two pounds over the last four months. She is attempting to improve her diet by eating more fruits and vegetables but is not consuming the recommended amount of protein. She is also trying to hydrate but often skips meals and does not get seven or more hours of sleep per night. She is not currently exercising.  She has a history of hyperlipidemia, which is not currently  being treated with a statin. Recent labs show an increase in A1c to 6.4. A CT cardiac scoring calcium test was zero.  She previously attempted a keto diet and began exercising but had to stop due to a back injury that caused excruciating nerve pain. She sought treatment from a spine specialist and a chiropractor, which resolved her back pain. She discontinued the keto diet due to gastrointestinal discomfort from dairy consumption, which she has since eliminated from her diet.  She reports low iron saturation, discovered by her sleep apnea doctor, Dr. Elza. She is taking iron drops daily,  although she has not noticed an improvement in her fatigue. She also reports a history of mold exposure in her home, which has been remediated, but she feels it has contributed to her fatigue.  She has been experiencing pain in her chest area, which was evaluated by her general practitioner. A CT scan showed no cardiac issues, and she was cleared for exercise. Despite this, she has been tired and not felt motivated to exercise.  She has been taking Zepbound  for four doses and is concerned about its potential contribution to her fatigue. She also reports constipation, which she attributes to taking Zofran  for nausea.  Her daughter, who is in nursing school, has expressed surprise at her weight issues given her dietary habits. She reports that her triglycerides were elevated in a recent non-fasting test, but her LDL cholesterol has improved from 146 to 120. Her thyroid  function was noted to be stable, though she experiences significant stress.      PHYSICAL EXAM:  Blood pressure 120/80, pulse 98, temperature 98.6 F (37 C), height 5' 1 (1.549 m), weight 251 lb (113.9 kg), SpO2 97%. Body mass index is 47.43 kg/m.  DIAGNOSTIC DATA REVIEWED:  BMET    Component Value Date/Time   NA 138 05/22/2024 1602   NA 138 05/01/2023 1505   K 4.2 05/22/2024 1602   CL 103 05/22/2024 1602   CO2 23 05/22/2024 1602   GLUCOSE 82 05/22/2024 1602   BUN 20 05/22/2024 1602   BUN 18 05/01/2023 1505  CREATININE 0.89 05/22/2024 1602   CALCIUM 9.5 05/22/2024 1602   GFRNONAA 77 02/24/2018 1428   GFRAA 88 02/24/2018 1428   Lab Results  Component Value Date   HGBA1C 6.4 05/22/2024   HGBA1C 5.6 01/12/2017   Lab Results  Component Value Date   INSULIN  11.0 05/01/2023   Lab Results  Component Value Date   TSH 3.59 05/22/2024   CBC    Component Value Date/Time   WBC 11.9 (H) 05/22/2024 1602   RBC 4.82 05/22/2024 1602   HGB 12.4 05/22/2024 1602   HGB 12.4 05/01/2023 1505   HCT 38.3 05/22/2024 1602    HCT 37.9 05/01/2023 1505   PLT 401.0 (H) 05/22/2024 1602   PLT 412 05/01/2023 1505   MCV 79.6 05/22/2024 1602   MCV 81 05/01/2023 1505   MCH 26.4 (L) 05/01/2023 1505   MCH 27.1 12/16/2013 0932   MCHC 32.4 05/22/2024 1602   RDW 14.9 05/22/2024 1602   RDW 13.6 05/01/2023 1505   Iron Studies    Component Value Date/Time   IRON 46 05/16/2024 1436   TIBC 334 05/16/2024 1436   FERRITIN 47 05/16/2024 1436   IRONPCTSAT 14 (L) 05/16/2024 1436   Lipid Panel     Component Value Date/Time   CHOL 222 (H) 05/22/2024 1602   CHOL 232 (H) 05/01/2023 1505   TRIG 179.0 (H) 05/22/2024 1602   HDL 65.70 05/22/2024 1602   HDL 68 05/01/2023 1505   CHOLHDL 3 05/22/2024 1602   VLDL 35.8 05/22/2024 1602   LDLCALC 120 (H) 05/22/2024 1602   LDLCALC 146 (H) 05/01/2023 1505   Hepatic Function Panel     Component Value Date/Time   PROT 7.5 05/22/2024 1602   PROT 7.0 05/01/2023 1505   ALBUMIN 4.2 05/22/2024 1602   ALBUMIN 4.4 05/01/2023 1505   AST 11 05/22/2024 1602   ALT 10 05/22/2024 1602   ALKPHOS 64 05/22/2024 1602   BILITOT 0.3 05/22/2024 1602   BILITOT 0.4 05/01/2023 1505      Component Value Date/Time   TSH 3.59 05/22/2024 1602   Nutritional Lab Results  Component Value Date   VD25OH 61.4 05/01/2023   VD25OH 32.6 01/12/2017     Assessment and Plan Assessment & Plan Obesity Obesity management is ongoing. She has gained two pounds in the last four months. She is not exercising due to a recent back injury but has been cleared for exercise. She is trying to eat more fruits and vegetables but is not getting the recommended amount of protein. She is skipping meals and not getting adequate sleep. Discussed the importance of not skipping meals while on GLP-1 medications like Zepbound  to prevent malnutrition and muscle loss. Emphasized the need for adequate protein intake to maintain muscle mass and metabolism. Discussed the potential side effects of Zepbound , including decreased appetite  and the risk of malnutrition if not eating enough protein.  - Continue Zepbound  at current dose for another month before considering dose adjustment - Focus on increasing protein intake to 30 grams per meal - Avoid skipping meals - Avoid sweet potatoes and carrots to manage A1c - Schedule follow-up in 4-6 weeks  Prediabetes A1c has increased to 6.4, indicating she is very close to diabetes. Discussed dietary modifications to manage blood sugar levels, including avoiding high glycemic index foods like sweet potatoes and carrots. Emphasized the importance of protein intake to prevent blood sugar spikes and maintain energy levels. - Monitor A1c levels - Avoid high glycemic index foods like sweet potatoes and  carrots - Increase protein intake to stabilize blood sugar levels  Pure hypercholesterolemia Hyperlipidemia is present but not currently treated with a statin. LDL cholesterol has improved from 146 to 120, but the goal is to have it under 100. Triglycerides were elevated, but the test was not fasting, so it is not a reliable indicator. - Monitor LDL cholesterol levels - Aim to reduce LDL cholesterol to below 100  Iron deficiency Iron saturation is low, contributing to fatigue and possibly affecting sleep quality. She is taking iron drops daily instead of every other day as recommended, but has not noticed an improvement in fatigue. Discussed the potential impact of low iron on sleep and overall energy levels. - Continue taking iron drops daily - Re-evaluate iron levels in 3 months  Constipation Constipation is present, possibly exacerbated by the use of Zofran  for nausea. Discussed dietary modifications to increase fiber intake, including the use of chia seeds and beans. - Increase fiber intake with chia seeds and beans - Avoid Zofran  to prevent constipation  Insomnia Insomnia may be related to low iron levels and other stressors. Discussed the impact of low iron on sleep quality. Mold  exposure in her home may also be contributing to poor sleep and overall fatigue. - Continue addressing iron deficiency - Monitor sleep quality and address environmental factors like mold exposure     I personally spent a total of 45 minutes in the care of the patient today including preparing to see the patient, performing a medically appropriate evaluation of current problems, placing orders in the EMR, documenting clinical information in the EMR, customized nutritional counseling for their specific health and social needs, and discussing results with the patient and educating them on how these results can affect their health and weight.  Charli was counseled on the importance of maintaining healthy lifestyle habits, including balanced nutrition, regular physical activity, and behavioral modifications, while taking antiobesity medication.  Patient verbalized understanding that medication is an adjunct to, not a replacement for, lifestyle changes and that the long-term success and weight maintenance depend on continued adherence to these strategies.   Iviona was informed of the importance of frequent follow up visits to maximize her success with intensive lifestyle modifications for her obesity and obesity related health conditions as recommended by USPSTF and CMS guidelines   Louann Penton, MD

## 2024-07-02 ENCOUNTER — Encounter (INDEPENDENT_AMBULATORY_CARE_PROVIDER_SITE_OTHER): Payer: Self-pay | Admitting: Family Medicine

## 2024-07-03 DIAGNOSIS — M5442 Lumbago with sciatica, left side: Secondary | ICD-10-CM | POA: Diagnosis not present

## 2024-07-03 DIAGNOSIS — M546 Pain in thoracic spine: Secondary | ICD-10-CM | POA: Diagnosis not present

## 2024-07-03 DIAGNOSIS — M9902 Segmental and somatic dysfunction of thoracic region: Secondary | ICD-10-CM | POA: Diagnosis not present

## 2024-07-08 ENCOUNTER — Ambulatory Visit: Admitting: Emergency Medicine

## 2024-07-08 ENCOUNTER — Encounter: Payer: Self-pay | Admitting: Radiology

## 2024-07-08 ENCOUNTER — Encounter: Payer: Self-pay | Admitting: Emergency Medicine

## 2024-07-08 DIAGNOSIS — F411 Generalized anxiety disorder: Secondary | ICD-10-CM

## 2024-07-08 DIAGNOSIS — G4733 Obstructive sleep apnea (adult) (pediatric): Secondary | ICD-10-CM | POA: Diagnosis not present

## 2024-07-08 DIAGNOSIS — E039 Hypothyroidism, unspecified: Secondary | ICD-10-CM

## 2024-07-08 DIAGNOSIS — R7303 Prediabetes: Secondary | ICD-10-CM

## 2024-07-08 MED ORDER — FLUOXETINE HCL 10 MG PO TABS: 5.0000 mg | ORAL_TABLET | Freq: Every day | ORAL | 2 refills | Status: AC

## 2024-07-08 NOTE — Assessment & Plan Note (Signed)
 Goes to medical weight management on a regular basis Diet and nutrition discussed Advised to decrease amount of daily carbohydrate intake and daily calories and increase amount of plant based protein in her diet Benefits of exercise discussed Was able to start Zepbound .  At this point does not want to increase dose

## 2024-07-08 NOTE — Assessment & Plan Note (Signed)
 Lab Results  Component Value Date   HGBA1C 6.4 05/22/2024   Cardiovascular risks associated with diabetes discussed Diet and nutrition discussed

## 2024-07-08 NOTE — Assessment & Plan Note (Signed)
 Clinically euthyroid. Continue Synthroid  75 mcg daily Lab Results  Component Value Date   TSH 3.59 05/22/2024

## 2024-07-08 NOTE — Assessment & Plan Note (Signed)
Stable.  On CPAP treatment. 

## 2024-07-08 NOTE — Progress Notes (Signed)
 Meagan [name] 48 y.o.   Chief Complaint  Patient presents with   Follow-up    HISTORY OF PRESENT ILLNESS: This is a 48 y.o. female here for 62-month follow-up Has been able to start Zepbound  and sees nutritionist on a regular basis Overall doing well. Has no complaints or medical concerns today. Wt Readings from Last 3 Encounters:  07/08/24 254 lb (115.2 kg)  07/01/24 251 lb (113.9 kg)  05/22/24 260 lb (117.9 kg)     HPI   Prior to Admission medications   Medication Sig Start Date End Date Taking? Authorizing Provider  tirzepatide  (ZEPBOUND ) 2.5 MG/0.5ML Pen Inject 2.5 mg into the skin once a week. Increase to 5 mg weekly after 3 to 4 weeks if side effects tolerated Patient taking differently: Inject 2.5 mg into the skin once a week. Increase to 5 mg weekly after 3 to 4 weeks if side effects tolerated/ pt takes it every other week 05/28/24  Yes Tyshawn Keel, Emil Schanz, MD  FLUoxetine  (PROZAC ) 10 MG tablet TAKE 1/2 TABLET BY MOUTH DAILY 04/29/22   Levora Reyes SAUNDERS, MD  levothyroxine  (SYNTHROID ) 75 MCG tablet Take 75 mcg by mouth daily before breakfast.    [provider]  LO LOESTRIN FE 1 MG-10 MCG / 10 MCG tablet Take 1 tablet by mouth daily. 01/28/24   [provider]  Magnesium  Gluconate 250 MG TABS Take 1 tablet (250 mg total) by mouth 2 (two) times daily. 05/16/24   Dohmeier, Dedra, MD  meloxicam  (MOBIC ) 7.5 MG tablet TAKE 1 TABLET BY MOUTH EVERY DAY 01/21/22   Levora Reyes SAUNDERS, MD  montelukast  (SINGULAIR ) 10 MG tablet TAKE 1 TABLET BY MOUTH EVERYDAY AT BEDTIME 06/01/21   Levora Reyes SAUNDERS, MD  ondansetron  (ZOFRAN -ODT) 4 MG disintegrating tablet Take 1 tablet (4 mg total) by mouth every 8 (eight) hours as needed for nausea or vomiting. 12/26/23   Tanielle Emigh Jose, MD  VITAMIN D , CHOLECALCIFEROL, PO Take by mouth.    [provider]    Allergies  Allergen Reactions   Cymbalta  [Duloxetine  Hcl]     intolerance   Doxycycline  Hyclate Other (See  Comments)    Stomach upset   Wellbutrin  [Bupropion ] Nausea Only   Amoxicillin Itching and Rash    Patient Active Problem List   Diagnosis Date Noted   Nonspecific chest pain 05/22/2024   Dyslipidemia 05/22/2024   RLS (restless legs syndrome) 05/16/2024   Class 3 severe obesity due to excess calories with serious comorbidity and body mass index (BMI) of 50.0 to 59.9 in adult [facility]) 05/16/2024   Depression 06/13/2023   Prediabetes 05/16/2023   Vitamin D  deficiency 05/16/2023   Pure hypercholesterolemia 05/16/2023   Chronic pain syndrome 11/18/2022   Polyarthralgia 11/18/2022   Hypothyroidism 11/18/2022   Positive ANA (antinuclear antibody) 04/07/2021   Seasonal allergic rhinitis 03/02/2018   Acquired hypothyroidism 03/02/2018   Generalized anxiety disorder 09/03/2017   Morbid obesity (HCC) 09/03/2017   BMI 45.0-49.9, adult (HCC) 02/09/2015   Severe obstructive sleep apnea-hypopnea syndrome 02/09/2015   Hypersomnia, persistent 01/11/2013    Past Medical History:  Diagnosis Date   Acquired hypothyroidism 03/02/2018   Allergy    Seasonal   Anxiety    Chronic pain    Complication of anesthesia    post -op nausea and vomiting at age 61   Constipation, chronic    Depression    Dyslipidemia 05/22/2024   Family history of anesthesia complication    cousin woke-up during procedure   GERD (gastroesophageal  reflux disease)    Narcolepsy    Other secondary hypertension 09/26/2023   Ruptured lumbar disc    L5   Sleep apnea    wears CPAP nightly   Sleep paralysis    at times   Uncontrolled narcolepsy 04/03/2013   Diagnosed 2002 , by Dr Reggy Salt. Meanwhile  Medications have become ineffective and patient gained 100 pounds, developed OSA, now on CPAP 9 cm, MSLT to follow in 04-19-13.    Vivid dream     Past Surgical History:  Procedure Laterality Date   CHOLECYSTECTOMY N/A 01/01/2014   Procedure: LAPAROSCOPIC CHOLECYSTECTOMY WITH INTRAOPERATIVE CHOLANGIOGRAM;  Surgeon:  Donnice POUR. Belinda, MD;  Location: [facility] OR;  Service: General;  Laterality: N/A;   TONSILLECTOMY AND ADENOIDECTOMY  1981   age 89   VAGINAL DELIVERY      Social History   Socioeconomic History   Marital status: Married    Spouse name: Shakiah Wester   Number of children: 1   Years of education: some col.   Highest education level: Associate degree: occupational, scientist, product/process development, or vocational program  Occupational History    Employer: [employer]    Comment: [employer]  Tobacco Use   Smoking status: Former    Types: Cigarettes    Start date: 09/05/2001   Smokeless tobacco: Never   Tobacco comments:    10 years ago  Vaping Use   Vaping status: Never Used  Substance and Sexual Activity   Alcohol use: Yes    Comment: occasionally,one 3-7 oz of wine per week   Drug use: No   Sexual activity: Not Currently    Birth control/protection: Pill    Comment: kariva  Other Topics Concern   Not on file  Social History Narrative   Patient is single and lives at home, her daughter lives with her.   Patient has one child.   Patient is working full-time.   Patient has some college education.   Patient is left handed.   Patient does not drink any caffeine.   Social Drivers of Health   Financial Resource Strain: Medium Risk (07/06/2024)   Overall Financial Resource Strain (CARDIA)    Difficulty of Paying Living Expenses: Somewhat hard  Food Insecurity: No Food Insecurity (07/06/2024)   Hunger Vital Sign    Worried About Running Out of Food in the Last Year: Never true    Ran Out of Food in the Last Year: Never true  Transportation Needs: No Transportation Needs (07/06/2024)   PRAPARE - Administrator, Civil Service (Medical): No    Lack of Transportation (Non-Medical): No  Physical Activity: Inactive (07/06/2024)   Exercise Vital Sign    Days of Exercise per Week: 0 days    Minutes of Exercise per Session: Not on file  Stress: Stress Concern Present (07/06/2024)    [employer] of Occupational Health - Occupational Stress Questionnaire    Feeling of Stress: Very much  Social Connections: Moderately Isolated (07/06/2024)   Social Connection and Isolation Panel    Frequency of Communication with Friends and Family: Three times a week    Frequency of Social Gatherings with Friends and Family: Once a week    Attends Religious Services: Never    Database Administrator or Organizations: No    Attends Engineer, Structural: Not on file    Marital Status: Married  Catering Manager Violence: Not on file    Family History  Problem Relation Age of Onset  Alcohol abuse Mother    Depression Mother    Anxiety disorder Mother    Sleep apnea Mother    Alcoholism Mother    Diabetes Mother    Alcohol abuse Father    Alcoholism Father    Alcohol abuse Brother    Alcoholism Brother    Multiple sclerosis Maternal Grandmother    Breast cancer Paternal Grandmother    Healthy Daughter      Review of Systems  Constitutional: Negative.  Negative for chills and fever.  HENT: Negative.  Negative for congestion and sore throat.   Respiratory: Negative.  Negative for cough and shortness of breath.   Cardiovascular: Negative.  Negative for chest pain and palpitations.  Gastrointestinal:  Positive for constipation. Negative for abdominal pain, diarrhea, nausea and vomiting.  Genitourinary: Negative.  Negative for dysuria and hematuria.  Skin: Negative.  Negative for rash.  Neurological: Negative.  Negative for dizziness and headaches.  All other systems reviewed and are negative.   Vitals:   07/08/24 1546  BP: 130/82  Pulse: 99  Temp: 98.5 F (36.9 C)  SpO2: 97%    Physical Exam Vitals reviewed.  Constitutional:      Appearance: Normal appearance.  HENT:     Head: Normocephalic.  Eyes:     Extraocular Movements: Extraocular movements intact.  Cardiovascular:     Rate and Rhythm: Normal rate and regular rhythm.  Pulmonary:      Effort: Pulmonary effort is normal.     Breath sounds: Normal breath sounds.  Skin:    General: Skin is warm and dry.     Capillary Refill: Capillary refill takes less than 2 seconds.  Neurological:     General: No focal deficit present.     Mental Status: She is alert and oriented to person, place, and time.  Psychiatric:        Mood and Affect: Mood normal.        Behavior: Behavior normal.      ASSESSMENT & PLAN: Problem List Items Addressed This Visit       Respiratory   Severe obstructive sleep apnea-hypopnea syndrome   Stable. On CPAP treatment.         Endocrine   Acquired hypothyroidism   Clinically euthyroid. Continue Synthroid  75 mcg daily Lab Results  Component Value Date   TSH 3.59 05/22/2024           Other   Generalized anxiety disorder   Relevant Medications   FLUoxetine  (PROZAC ) 10 MG tablet   Morbid obesity (HCC) - Primary   Goes to medical weight management on a regular basis Diet and nutrition discussed Advised to decrease amount of daily carbohydrate intake and daily calories and increase amount of plant based protein in her diet Benefits of exercise discussed Was able to start Zepbound .  At this point does not want to increase dose      Prediabetes   Lab Results  Component Value Date   HGBA1C 6.4 05/22/2024   Cardiovascular risks associated with diabetes discussed Diet and nutrition discussed      Patient Instructions  Health Maintenance, Female Adopting a healthy lifestyle and getting preventive care are important in promoting health and wellness. Ask your health care provider about: The right schedule for you to have regular tests and exams. Things you can do on your own to prevent diseases and keep yourself healthy. What should I know about diet, weight, and exercise? Eat a healthy diet  Eat a diet that includes plenty  of vegetables, fruits, low-fat dairy products, and lean protein. Do not eat a lot of foods that are high in  solid fats, added sugars, or sodium. Maintain a healthy weight Body mass index (BMI) is used to identify weight problems. It estimates body fat based on height and weight. Your health care provider can help determine your BMI and help you achieve or maintain a healthy weight. Get regular exercise Get regular exercise. This is one of the most important things you can do for your health. Most adults should: Exercise for at least 150 minutes each week. The exercise should increase your heart rate and make you sweat (moderate-intensity exercise). Do strengthening exercises at least twice a week. This is in addition to the moderate-intensity exercise. Spend less time sitting. Even light physical activity can be beneficial. Watch cholesterol and blood lipids Have your blood tested for lipids and cholesterol at 49 years of age, then have this test every 5 years. Have your cholesterol levels checked more often if: Your lipid or cholesterol levels are high. You are older than 49 years of age. You are at high risk for heart disease. What should I know about cancer screening? Depending on your health history and family history, you may need to have cancer screening at various ages. This may include screening for: Breast cancer. Cervical cancer. Colorectal cancer. Skin cancer. Lung cancer. What should I know about heart disease, diabetes, and high blood pressure? Blood pressure and heart disease High blood pressure causes heart disease and increases the risk of stroke. This is more likely to develop in people who have high blood pressure readings or are overweight. Have your blood pressure checked: Every 3-5 years if you are 71-38 years of age. Every year if you are 29 years old or older. Diabetes Have regular diabetes screenings. This checks your fasting blood sugar level. Have the screening done: Once every three years after age 7 if you are at a normal weight and have a low risk for  diabetes. More often and at a younger age if you are overweight or have a high risk for diabetes. What should I know about preventing infection? Hepatitis B If you have a higher risk for hepatitis B, you should be screened for this virus. Talk with your health care provider to find out if you are at risk for hepatitis B infection. Hepatitis C Testing is recommended for: Everyone born from [birth year] through 1966. Anyone with known risk factors for hepatitis C. Sexually transmitted infections (STIs) Get screened for STIs, including gonorrhea and chlamydia, if: You are sexually active and are younger than 49 years of age. You are older than 49 years of age and your health care provider tells you that you are at risk for this type of infection. Your sexual activity has changed since you were last screened, and you are at increased risk for chlamydia or gonorrhea. Ask your health care provider if you are at risk. Ask your health care provider about whether you are at high risk for HIV. Your health care provider may recommend a prescription medicine to help prevent HIV infection. If you choose to take medicine to prevent HIV, you should first get tested for HIV. You should then be tested every 3 months for as long as you are taking the medicine. Pregnancy If you are about to stop having your period (premenopausal) and you may become pregnant, seek counseling before you get pregnant. Take 400 to 800 micrograms (mcg) of folic acid every day if  you become pregnant. Ask for birth control (contraception) if you want to prevent pregnancy. Osteoporosis and menopause Osteoporosis is a disease in which the bones lose minerals and strength with aging. This can result in bone fractures. If you are 35 years old or older, or if you are at risk for osteoporosis and fractures, ask your health care provider if you should: Be screened for bone loss. Take a calcium or vitamin D  supplement to lower your risk of  fractures. Be given hormone replacement therapy (HRT) to treat symptoms of menopause. Follow these instructions at home: Alcohol use Do not drink alcohol if: Your health care provider tells you not to drink. You are pregnant, may be pregnant, or are planning to become pregnant. If you drink alcohol: Limit how much you have to: 0-1 drink a day. Know how much alcohol is in your drink. In the [place] one drink equals one 12 oz bottle of beer (355 mL), one 5 oz glass of wine (148 mL), or one 1 oz glass of hard liquor (44 mL). Lifestyle Do not use any products that contain nicotine or tobacco. These products include cigarettes, chewing tobacco, and vaping devices, such as e-cigarettes. If you need help quitting, ask your health care provider. Do not use street drugs. Do not share needles. Ask your health care provider for help if you need support or information about quitting drugs. General instructions Schedule regular health, dental, and eye exams. Stay current with your vaccines. Tell your health care provider if: You often feel depressed. You have ever been abused or do not feel safe at home. Summary Adopting a healthy lifestyle and getting preventive care are important in promoting health and wellness. Follow your health care provider's instructions about healthy diet, exercising, and getting tested or screened for diseases. Follow your health care provider's instructions on monitoring your cholesterol and blood pressure. This information is not intended to replace advice given to you by your health care provider. Make sure you discuss any questions you have with your health care provider. Document Revised: 01/11/2021 Document Reviewed: 01/11/2021 Elsevier Patient Education  2024 Elsevier Inc.     Emil Schaumann, MD [facility]

## 2024-07-08 NOTE — Patient Instructions (Signed)

## 2024-07-10 DIAGNOSIS — M9902 Segmental and somatic dysfunction of thoracic region: Secondary | ICD-10-CM | POA: Diagnosis not present

## 2024-07-10 DIAGNOSIS — M9905 Segmental and somatic dysfunction of pelvic region: Secondary | ICD-10-CM | POA: Diagnosis not present

## 2024-07-10 DIAGNOSIS — M25571 Pain in right ankle and joints of right foot: Secondary | ICD-10-CM | POA: Diagnosis not present

## 2024-07-10 DIAGNOSIS — M25572 Pain in left ankle and joints of left foot: Secondary | ICD-10-CM | POA: Diagnosis not present

## 2024-07-10 DIAGNOSIS — M9904 Segmental and somatic dysfunction of sacral region: Secondary | ICD-10-CM | POA: Diagnosis not present

## 2024-07-10 DIAGNOSIS — M5442 Lumbago with sciatica, left side: Secondary | ICD-10-CM | POA: Diagnosis not present

## 2024-07-10 DIAGNOSIS — M9906 Segmental and somatic dysfunction of lower extremity: Secondary | ICD-10-CM | POA: Diagnosis not present

## 2024-07-18 DIAGNOSIS — F411 Generalized anxiety disorder: Secondary | ICD-10-CM | POA: Diagnosis not present

## 2024-07-18 DIAGNOSIS — F902 Attention-deficit hyperactivity disorder, combined type: Secondary | ICD-10-CM | POA: Diagnosis not present

## 2024-07-23 ENCOUNTER — Other Ambulatory Visit: Payer: Self-pay

## 2024-07-23 ENCOUNTER — Encounter: Payer: Self-pay | Admitting: Emergency Medicine

## 2024-07-23 MED ORDER — FLUTICASONE PROPIONATE 50 MCG/ACT NA SUSP: 2.0000 | Freq: Every day | NASAL | 6 refills | Status: AC

## 2024-07-23 NOTE — Telephone Encounter (Signed)
 Ok to send in.

## 2024-07-23 NOTE — Telephone Encounter (Signed)
 Okay to send

## 2024-07-24 DIAGNOSIS — M25572 Pain in left ankle and joints of left foot: Secondary | ICD-10-CM | POA: Diagnosis not present

## 2024-07-24 DIAGNOSIS — M9906 Segmental and somatic dysfunction of lower extremity: Secondary | ICD-10-CM | POA: Diagnosis not present

## 2024-07-24 DIAGNOSIS — M9904 Segmental and somatic dysfunction of sacral region: Secondary | ICD-10-CM | POA: Diagnosis not present

## 2024-07-24 DIAGNOSIS — M9903 Segmental and somatic dysfunction of lumbar region: Secondary | ICD-10-CM | POA: Diagnosis not present

## 2024-07-24 DIAGNOSIS — M9905 Segmental and somatic dysfunction of pelvic region: Secondary | ICD-10-CM | POA: Diagnosis not present

## 2024-07-24 DIAGNOSIS — M9902 Segmental and somatic dysfunction of thoracic region: Secondary | ICD-10-CM | POA: Diagnosis not present

## 2024-07-24 DIAGNOSIS — M25571 Pain in right ankle and joints of right foot: Secondary | ICD-10-CM | POA: Diagnosis not present

## 2024-08-07 DIAGNOSIS — M9904 Segmental and somatic dysfunction of sacral region: Secondary | ICD-10-CM | POA: Diagnosis not present

## 2024-08-07 DIAGNOSIS — M9903 Segmental and somatic dysfunction of lumbar region: Secondary | ICD-10-CM | POA: Diagnosis not present

## 2024-08-07 DIAGNOSIS — M546 Pain in thoracic spine: Secondary | ICD-10-CM | POA: Diagnosis not present

## 2024-08-07 DIAGNOSIS — M25571 Pain in right ankle and joints of right foot: Secondary | ICD-10-CM | POA: Diagnosis not present

## 2024-08-07 DIAGNOSIS — M25572 Pain in left ankle and joints of left foot: Secondary | ICD-10-CM | POA: Diagnosis not present

## 2024-08-07 DIAGNOSIS — M9906 Segmental and somatic dysfunction of lower extremity: Secondary | ICD-10-CM | POA: Diagnosis not present

## 2024-08-07 DIAGNOSIS — M9905 Segmental and somatic dysfunction of pelvic region: Secondary | ICD-10-CM | POA: Diagnosis not present

## 2024-08-08 ENCOUNTER — Ambulatory Visit (INDEPENDENT_AMBULATORY_CARE_PROVIDER_SITE_OTHER): Payer: Self-pay | Admitting: Family Medicine

## 2024-08-08 ENCOUNTER — Encounter (INDEPENDENT_AMBULATORY_CARE_PROVIDER_SITE_OTHER): Payer: Self-pay | Admitting: Family Medicine

## 2024-08-08 VITALS — BP 127/83 | HR 86 | Temp 98.5°F | Ht 61.0 in | Wt 244.0 lb

## 2024-08-08 DIAGNOSIS — K5900 Constipation, unspecified: Secondary | ICD-10-CM

## 2024-08-08 DIAGNOSIS — R1013 Epigastric pain: Secondary | ICD-10-CM | POA: Diagnosis not present

## 2024-08-08 DIAGNOSIS — G4733 Obstructive sleep apnea (adult) (pediatric): Secondary | ICD-10-CM | POA: Diagnosis not present

## 2024-08-08 DIAGNOSIS — Z6841 Body Mass Index (BMI) 40.0 and over, adult: Secondary | ICD-10-CM

## 2024-08-08 MED ORDER — FAMOTIDINE 20 MG PO TABS: 20.0000 mg | ORAL_TABLET | Freq: Every day | ORAL | 0 refills | Status: AC

## 2024-08-08 NOTE — Progress Notes (Signed)
 Office: [phone]  /  Fax: [phone]  WEIGHT SUMMARY AND BIOMETRICS  Anthropometric Measurements Height: 5' 1 (1.549 m) Weight: 244 lb (110.7 kg) BMI (Calculated): 46.13 Weight at Last Visit: 244 lb Weight Lost Since Last Visit: 7 lb Weight Gained Since Last Visit: 0 Starting Weight: 258 lb Total Weight Loss (lbs): 14 lb (6.35 kg) Peak Weight: 262 lb   Body Composition  Body Fat %: 48.1 % Fat Mass (lbs): 117.4 lbs Muscle Mass (lbs): 120.4 lbs Total Body Water (lbs): 83.8 lbs Visceral Fat Rating : 16   Other Clinical Data Fasting: no Labs: no Today's Visit #: 12 Starting Date: 05/01/23    Chief Complaint: OBESITY   History of Present Illness Meagan [name] is a 48 year old female who presents for obesity treatment and progress assessment.  She adheres to a vegetarian eating plan approximately 75% of the time and struggles to meet her recommended protein intake, which she worries could affect her metabolism and muscle mass. She occasionally skips meals, tries to consume more fruits and vegetables, and ensures adequate hydration. She generally sleeps seven to nine hours per night and exercises five days a week by walking for about fifteen minutes. She has lost seven pounds over the last five weeks.  She is currently on Zepbound  for obstructive sleep apnea. She experiences anxiety related to the medication, fearing it will cause her to vomit. She has vomited twice after consuming tomato-based foods like spaghetti sauce and salsa, suspecting these episodes are related to acid reflux, as she feels discomfort before bed and wakes up to vomit. She has also experienced nausea after eating [employer] chicken tortilla soup but avoided vomiting by skipping a meal.  She has stopped taking an anti-nausea medication, which she realized was causing constipation. She has removed cheese and milk from her diet to see if it helps her gastrointestinal symptoms, though she still  experiences issues with raw vegetables like broccoli. She takes a probiotic and magnesium  gluconate for restless leg syndrome. She drinks Gatorade Zero when feeling queasy or having a headache.  Her husband is also on a GLP-1 medication but has stopped losing weight. He is not getting enough protein, which concerns her as it may affect his muscle mass. He is currently with his mother, who had a foot amputation due to diabetes, and she advised him to focus on protein and fiber intake during this time.  She is no longer sleeping with her husband, which has improved her sleep quality. She uses a CPAP machine and reports good tolerance and satisfaction with it.  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 She has three dogs and is training them, which involves short walks. She plans meals for the holidays and is focused on maintaining her nutrition.      PHYSICAL EXAM:  Blood pressure 127/83, pulse 86, temperature 98.5 F (36.9 C), height 5' 1 (1.549 m), weight 244 lb (110.7 kg), SpO2 98%. Body mass index is 46.1 kg/m.  DIAGNOSTIC DATA REVIEWED:  BMET    Component Value Date/Time   NA 138 05/22/2024 1602   NA 138 05/01/2023 1505   K 4.2 05/22/2024 1602   CL 103 05/22/2024 1602   CO2 23 05/22/2024 1602   GLUCOSE 82 05/22/2024 1602   BUN 20 05/22/2024 1602   BUN 18 05/01/2023 1505   CREATININE 0.89 05/22/2024 1602   CALCIUM 9.5 05/22/2024 1602  GFRNONAA 77 02/24/2018 1428   GFRAA 88 02/24/2018 1428   Lab Results  Component Value Date   HGBA1C 6.4 05/22/2024   HGBA1C 5.6 01/12/2017   Lab Results  Component Value Date   INSULIN  11.0 05/01/2023   Lab Results  Component Value Date   TSH 3.59 05/22/2024   CBC    Component Value Date/Time   WBC 11.9 (H) 05/22/2024 1602   RBC 4.82 05/22/2024 1602   HGB 12.4 05/22/2024 1602   HGB 12.4 05/01/2023 1505   HCT 38.3 05/22/2024 1602   HCT 37.9  05/01/2023 1505   PLT 401.0 (H) 05/22/2024 1602   PLT 412 05/01/2023 1505   MCV 79.6 05/22/2024 1602   MCV 81 05/01/2023 1505   MCH 26.4 (L) 05/01/2023 1505   MCH 27.1 12/16/2013 0932   MCHC 32.4 05/22/2024 1602   RDW 14.9 05/22/2024 1602   RDW 13.6 05/01/2023 1505   Iron Studies    Component Value Date/Time   IRON 46 05/16/2024 1436   TIBC 334 05/16/2024 1436   FERRITIN 47 05/16/2024 1436   IRONPCTSAT 14 (L) 05/16/2024 1436   Lipid Panel     Component Value Date/Time   CHOL 222 (H) 05/22/2024 1602   CHOL 232 (H) 05/01/2023 1505   TRIG 179.0 (H) 05/22/2024 1602   HDL 65.70 05/22/2024 1602   HDL 68 05/01/2023 1505   CHOLHDL 3 05/22/2024 1602   VLDL 35.8 05/22/2024 1602   LDLCALC 120 (H) 05/22/2024 1602   LDLCALC 146 (H) 05/01/2023 1505   Hepatic Function Panel     Component Value Date/Time   PROT 7.5 05/22/2024 1602   PROT 7.0 05/01/2023 1505   ALBUMIN 4.2 05/22/2024 1602   ALBUMIN 4.4 05/01/2023 1505   AST 11 05/22/2024 1602   ALT 10 05/22/2024 1602   ALKPHOS 64 05/22/2024 1602   BILITOT 0.3 05/22/2024 1602   BILITOT 0.4 05/01/2023 1505      Component Value Date/Time   TSH 3.59 05/22/2024 1602   Nutritional Lab Results  Component Value Date   VD25OH 61.4 05/01/2023   VD25OH 32.6 01/12/2017     Assessment and Plan Assessment & Plan Obesity management and dietary counseling She is following a category three or vegetarian eating plan 75% of the time, struggling with protein intake, and skipping meals. She is trying to increase fruits, vegetables, and hydration. She exercises five days a week with 15-minute walks and has lost 7 pounds over the last five weeks. She is on Zepbound  for obstructive sleep apnea. She experiences anxiety and vomiting with certain foods, possibly related to acid reflux. She has eliminated dairy and is considering raw vegetables as a cause of GI issues. She is taking a probiotic and magnesium  gluconate for restless leg syndrome. -  Continue current dietary plan with focus on increasing protein intake. - Recommended famotidine  20 mg daily for acid reflux, with or without food, for one month. - Advised to discuss with pharmacist about prescription strength famotidine  for cost savings. - Encouraged continuation of exercise routine. - Advised to monitor dietary intake and adjust as needed.  Obstructive sleep apnea treated with GLP-1 agonist She is on Zepbound  for obstructive sleep apnea, prescribed by her primary care doctor. She is tolerating CPAP well and reports improved sleep quality. She experiences anxiety and vomiting with certain foods, possibly related to acid reflux. She is considering adjusting the frequency of Zepbound  administration after the holidays. - Continue Zepbound  for obstructive sleep apnea 2.5 mg every other week for  now - Continue CPAP therapy. - Will reassess Zepbound  administration frequency after the holidays.  Constipation, resolved after discontinuation of anti-nausea medication She experienced constipation due to anti-nausea medication, which resolved after discontinuation. She is no longer taking anti-nausea medication and reports no constipation issues. - Continue to avoid anti-nausea medication unless necessary.      Patients who are on anti-obesity medications are counseled on the importance of maintaining healthy lifestyle habits, including balanced nutrition, regular physical activity, and behavioral modifications,  Medication is an adjunct to, not a replacement for, lifestyle changes and that the long-term success and weight maintenance depend on continued adherence to these strategies.   Josephine was informed of the importance of frequent follow up visits to maximize her success with intensive lifestyle modifications for her obesity and obesity related health conditions as recommended by USPSTF and CMS guidelines  Louann Penton, MD

## 2024-08-08 NOTE — Addendum Note (Signed)
 Addended by: LAFE BAKER CROME on: 08/08/2024 03:21 PM   Modules accepted: Level of Service

## 2024-08-13 DIAGNOSIS — M9902 Segmental and somatic dysfunction of thoracic region: Secondary | ICD-10-CM | POA: Diagnosis not present

## 2024-08-13 DIAGNOSIS — M9905 Segmental and somatic dysfunction of pelvic region: Secondary | ICD-10-CM | POA: Diagnosis not present

## 2024-08-13 DIAGNOSIS — M9904 Segmental and somatic dysfunction of sacral region: Secondary | ICD-10-CM | POA: Diagnosis not present

## 2024-08-13 DIAGNOSIS — M25572 Pain in left ankle and joints of left foot: Secondary | ICD-10-CM | POA: Diagnosis not present

## 2024-08-13 DIAGNOSIS — M25571 Pain in right ankle and joints of right foot: Secondary | ICD-10-CM | POA: Diagnosis not present

## 2024-08-13 DIAGNOSIS — M9906 Segmental and somatic dysfunction of lower extremity: Secondary | ICD-10-CM | POA: Diagnosis not present

## 2024-08-13 DIAGNOSIS — M9903 Segmental and somatic dysfunction of lumbar region: Secondary | ICD-10-CM | POA: Diagnosis not present

## 2024-08-15 DIAGNOSIS — F902 Attention-deficit hyperactivity disorder, combined type: Secondary | ICD-10-CM | POA: Diagnosis not present

## 2024-08-15 DIAGNOSIS — F411 Generalized anxiety disorder: Secondary | ICD-10-CM | POA: Diagnosis not present

## 2024-08-21 ENCOUNTER — Encounter (INDEPENDENT_AMBULATORY_CARE_PROVIDER_SITE_OTHER): Payer: Self-pay | Admitting: Family Medicine

## 2024-08-21 DIAGNOSIS — M25571 Pain in right ankle and joints of right foot: Secondary | ICD-10-CM | POA: Diagnosis not present

## 2024-08-21 DIAGNOSIS — M25572 Pain in left ankle and joints of left foot: Secondary | ICD-10-CM | POA: Diagnosis not present

## 2024-08-21 DIAGNOSIS — M9905 Segmental and somatic dysfunction of pelvic region: Secondary | ICD-10-CM | POA: Diagnosis not present

## 2024-08-21 DIAGNOSIS — M9906 Segmental and somatic dysfunction of lower extremity: Secondary | ICD-10-CM | POA: Diagnosis not present

## 2024-08-21 DIAGNOSIS — M9902 Segmental and somatic dysfunction of thoracic region: Secondary | ICD-10-CM | POA: Diagnosis not present

## 2024-08-21 DIAGNOSIS — M9904 Segmental and somatic dysfunction of sacral region: Secondary | ICD-10-CM | POA: Diagnosis not present

## 2024-08-21 DIAGNOSIS — M9903 Segmental and somatic dysfunction of lumbar region: Secondary | ICD-10-CM | POA: Diagnosis not present

## 2024-08-23 ENCOUNTER — Encounter: Payer: Self-pay | Admitting: Emergency Medicine

## 2024-08-23 MED ORDER — OSELTAMIVIR PHOSPHATE 75 MG PO CAPS: 75.0000 mg | ORAL_CAPSULE | Freq: Two times a day (BID) | ORAL | 0 refills | Status: AC

## 2024-08-23 NOTE — Telephone Encounter (Signed)
 Please advise as Md is out of office

## 2024-09-09 ENCOUNTER — Encounter (INDEPENDENT_AMBULATORY_CARE_PROVIDER_SITE_OTHER): Payer: Self-pay | Admitting: Family Medicine

## 2024-09-09 ENCOUNTER — Encounter (INDEPENDENT_AMBULATORY_CARE_PROVIDER_SITE_OTHER): Payer: Self-pay

## 2024-09-09 ENCOUNTER — Ambulatory Visit (INDEPENDENT_AMBULATORY_CARE_PROVIDER_SITE_OTHER): Payer: Self-pay | Admitting: Family Medicine

## 2024-09-12 ENCOUNTER — Encounter: Payer: Self-pay | Admitting: Emergency Medicine

## 2024-09-12 NOTE — Telephone Encounter (Signed)
 Please advise

## 2024-09-12 NOTE — Telephone Encounter (Signed)
 If she reads the instruction labels there are other places where she can self inject and maybe avoid this reaction.  Site reactions are to be expected.

## 2024-09-16 ENCOUNTER — Telehealth: Payer: Self-pay | Admitting: *Deleted

## 2024-09-16 NOTE — Telephone Encounter (Signed)
Ok with TOC 

## 2024-09-16 NOTE — Telephone Encounter (Signed)
 I am okay with this transfer of care.

## 2024-09-16 NOTE — Telephone Encounter (Signed)
 Copied from CRM #[id number]. Topic: Appointments - Transfer of Care >> Sep 16, 2024  2:44 PM [name] ORN wrote: Pt is requesting to transfer FROM: Meagan [name] Pt is requesting to transfer TO: Meagan [name] Reason for requested transfer: location

## 2024-09-16 NOTE — Telephone Encounter (Signed)
 Sounds like injectables are not a choice for her

## 2024-10-03 ENCOUNTER — Ambulatory Visit (INDEPENDENT_AMBULATORY_CARE_PROVIDER_SITE_OTHER): Admitting: Family Medicine

## 2024-10-03 ENCOUNTER — Encounter (INDEPENDENT_AMBULATORY_CARE_PROVIDER_SITE_OTHER): Payer: Self-pay | Admitting: Family Medicine

## 2024-10-03 VITALS — BP 125/81 | HR 92 | Temp 98.3°F | Ht 61.0 in | Wt 240.0 lb

## 2024-10-03 DIAGNOSIS — G4733 Obstructive sleep apnea (adult) (pediatric): Secondary | ICD-10-CM

## 2024-10-03 DIAGNOSIS — R1013 Epigastric pain: Secondary | ICD-10-CM

## 2024-10-03 DIAGNOSIS — T7840XA Allergy, unspecified, initial encounter: Secondary | ICD-10-CM

## 2024-10-03 DIAGNOSIS — Z6841 Body Mass Index (BMI) 40.0 and over, adult: Secondary | ICD-10-CM

## 2024-10-03 NOTE — Progress Notes (Signed)
 "  Office: [phone]  /  Fax: [phone]  WEIGHT SUMMARY AND BIOMETRICS  Anthropometric Measurements Height: 5' 1 (1.549 m) Weight: 240 lb (108.9 kg) BMI (Calculated): 45.37 Weight at Last Visit: 244 lb Weight Lost Since Last Visit: 4 lb Weight Gained Since Last Visit: 0 Starting Weight: 258 lb Total Weight Loss (lbs): 18 lb (8.165 kg) Peak Weight: 262 lb   Body Composition  Body Fat %: 48.2 % Fat Mass (lbs): 115.8 lbs Muscle Mass (lbs): 118.2 lbs Total Body Water (lbs): 84.2 lbs Visceral Fat Rating : 16   Other Clinical Data Fasting: no Labs: no Today's Visit #: 13 Starting Date: 05/01/23    Chief Complaint: OBESITY    History of Present Illness Meagan [name] is a 49 year old female with obesity and obstructive sleep apnea who presents for obesity treatment plan assessment and progress evaluation.  She is adhering to a category three eating plan approximately 75% of the time, focusing on increasing her intake of whole foods such as fruits and vegetables. She struggles to meet her protein intake goals, consuming a protein shake daily and chicken salad about four times a week. She has lost four pounds over the last seven weeks and is not currently engaging in any exercise.  She experiences an adverse reaction to Zepbound , with a persistent itchy spot at the injection site for two weeks. She scratches the area, especially during sleep, and finds relief with an ice pack. Zyrtec and alcohol wipes have been ineffective. Her skin is extremely sensitive.  She experiences bloating and has attempted to eliminate dairy from her diet, considering gluten elimination as well. Raw vegetables like cucumbers and romaine lettuce cause stomach discomfort, while red peppers do not. She consumes cooked vegetables to avoid burping and stomach aches.  Her family history is significant for allergies and autoimmune diseases, including celiac disease and multiple sclerosis. Her mother had  celiac disease and possibly another autoimmune condition before she passed away. Allergies and intolerances are common among the women in her family, particularly during menopause.      PHYSICAL EXAM:  Blood pressure 125/81, pulse 92, temperature 98.3 F (36.8 C), height 5' 1 (1.549 m), weight 240 lb (108.9 kg), SpO2 97%. Body mass index is 45.35 kg/m.  DIAGNOSTIC DATA REVIEWED BY MYSELF TODAY:  BMET    Component Value Date/Time   NA 138 05/22/2024 1602   NA 138 05/01/2023 1505   K 4.2 05/22/2024 1602   CL 103 05/22/2024 1602   CO2 23 05/22/2024 1602   GLUCOSE 82 05/22/2024 1602   BUN 20 05/22/2024 1602   BUN 18 05/01/2023 1505   CREATININE 0.89 05/22/2024 1602   CALCIUM 9.5 05/22/2024 1602   GFRNONAA 77 02/24/2018 1428   GFRAA 88 02/24/2018 1428   Lab Results  Component Value Date   HGBA1C 6.4 05/22/2024   HGBA1C 5.6 01/12/2017   Lab Results  Component Value Date   INSULIN  11.0 05/01/2023   Lab Results  Component Value Date   TSH 3.59 05/22/2024   CBC    Component Value Date/Time   WBC 11.9 (H) 05/22/2024 1602   RBC 4.82 05/22/2024 1602   HGB 12.4 05/22/2024 1602   HGB 12.4 05/01/2023 1505   HCT 38.3 05/22/2024 1602   HCT 37.9 05/01/2023 1505   PLT 401.0 (H) 05/22/2024 1602   PLT 412 05/01/2023 1505   MCV 79.6 05/22/2024 1602   MCV 81 05/01/2023 1505   MCH 26.4 (L) 05/01/2023 1505   MCH  27.1 12/16/2013 0932   MCHC 32.4 05/22/2024 1602   RDW 14.9 05/22/2024 1602   RDW 13.6 05/01/2023 1505   Iron Studies    Component Value Date/Time   IRON 46 05/16/2024 1436   TIBC 334 05/16/2024 1436   FERRITIN 47 05/16/2024 1436   IRONPCTSAT 14 (L) 05/16/2024 1436   Lipid Panel     Component Value Date/Time   CHOL 222 (H) 05/22/2024 1602   CHOL 232 (H) 05/01/2023 1505   TRIG 179.0 (H) 05/22/2024 1602   HDL 65.70 05/22/2024 1602   HDL 68 05/01/2023 1505   CHOLHDL 3 05/22/2024 1602   VLDL 35.8 05/22/2024 1602   LDLCALC 120 (H) 05/22/2024 1602   LDLCALC  146 (H) 05/01/2023 1505   Hepatic Function Panel     Component Value Date/Time   PROT 7.5 05/22/2024 1602   PROT 7.0 05/01/2023 1505   ALBUMIN 4.2 05/22/2024 1602   ALBUMIN 4.4 05/01/2023 1505   AST 11 05/22/2024 1602   ALT 10 05/22/2024 1602   ALKPHOS 64 05/22/2024 1602   BILITOT 0.3 05/22/2024 1602   BILITOT 0.4 05/01/2023 1505      Component Value Date/Time   TSH 3.59 05/22/2024 1602   Nutritional Lab Results  Component Value Date   VD25OH 61.4 05/01/2023   VD25OH 32.6 01/12/2017     Assessment and Plan Assessment & Plan Obesity Management is ongoing with a focus on dietary changes and weight loss. She has lost four pounds in the last seven weeks. She is following the category three eating plan 75% of the time, focusing on whole foods and protein intake, though struggles with meeting protein goals. She is not currently exercising. - Continue category three eating plan with emphasis on protein intake. - Encouraged incorporation of exercise into daily routine. - Discussed potential switch to oral Wegovy if insurance allows.  Obstructive sleep apnea Managed with weight loss and CPAP therapy. Weight loss is the primary long-term treatment strategy. - Continue weight loss efforts as primary treatment for obstructive sleep apnea.  Allergic reaction to Zepbound  She reports a persistent itchy spot at the injection site of Zepbound , which is unusual and more severe than typical reactions. The spot today is about 5 cm diameter and faintly pink. It is two weeks old and is relieved by ice packs. There is concern about potential underlying issues due to her sensitive skin and family history of allergies and autoimmune diseases. - Referred to allergy specialist for in-depth testing to determine the nature of the reaction. - Advised to discontinue Zepbound  until further evaluation by allergy specialist. - She would really like to continue this medication as it has been helping with her  OSA and weight loss.  Dyspepsia She reports bloating and discomfort with certain foods, particularly raw vegetables. She has tried eliminating dairy and is considering gluten as a potential trigger. Family history of celiac disease and autoimmune conditions noted. - Referred to allergy specialist for testing to identify potential food intolerances or allergies.      Patients who are on anti-obesity medications are counseled on the importance of maintaining healthy lifestyle habits, including balanced nutrition, regular physical activity, and behavioral modifications,  Medication is an adjunct to, not a replacement for, lifestyle changes and that the long-term success and weight maintenance depend on continued adherence to these strategies.   Meagan [name] was informed of the importance of frequent follow up visits to maximize her success with intensive lifestyle modifications for her obesity and obesity related health conditions as  recommended by USPSTF and CMS guidelines  Louann Penton, MD   "

## 2024-10-07 ENCOUNTER — Ambulatory Visit (INDEPENDENT_AMBULATORY_CARE_PROVIDER_SITE_OTHER): Admitting: Family Medicine

## 2024-10-30 ENCOUNTER — Encounter: Admitting: General Practice

## 2024-11-04 ENCOUNTER — Ambulatory Visit (INDEPENDENT_AMBULATORY_CARE_PROVIDER_SITE_OTHER): Admitting: Family Medicine

## 2024-11-19 ENCOUNTER — Ambulatory Visit: Admitting: Allergy & Immunology

## 2024-12-02 ENCOUNTER — Ambulatory Visit (INDEPENDENT_AMBULATORY_CARE_PROVIDER_SITE_OTHER): Admitting: Family Medicine

## 2025-05-26 ENCOUNTER — Ambulatory Visit: Admitting: Adult Health
